# Patient Record
Sex: Male | Born: 1955 | Race: White | Hispanic: No | Marital: Married | Smoking: Never smoker
Health system: Southern US, Community
[De-identification: ages and names within clinical notes are randomized; demographics above are authoritative.]

## PROBLEM LIST (undated history)

## (undated) DIAGNOSIS — E785 Hyperlipidemia, unspecified: Secondary | ICD-10-CM

## (undated) DIAGNOSIS — G4733 Obstructive sleep apnea (adult) (pediatric): Secondary | ICD-10-CM

## (undated) DIAGNOSIS — I10 Essential (primary) hypertension: Secondary | ICD-10-CM

## (undated) HISTORY — DX: Essential (primary) hypertension: I10

## (undated) HISTORY — DX: Hyperlipidemia, unspecified: E78.5

## (undated) HISTORY — DX: Obstructive sleep apnea (adult) (pediatric): G47.33

## (undated) HISTORY — PX: NASAL SEPTUM SURGERY: SHX37

---

## 2003-06-09 ENCOUNTER — Emergency Department (HOSPITAL_COMMUNITY): Admission: EM | Admit: 2003-06-09 | Discharge: 2003-06-10 | Payer: Self-pay | Admitting: Emergency Medicine

## 2003-06-10 ENCOUNTER — Emergency Department (HOSPITAL_COMMUNITY): Admission: EM | Admit: 2003-06-10 | Discharge: 2003-06-10 | Payer: Self-pay | Admitting: Emergency Medicine

## 2004-12-24 ENCOUNTER — Ambulatory Visit: Payer: Self-pay | Admitting: Internal Medicine

## 2005-01-01 ENCOUNTER — Ambulatory Visit: Payer: Self-pay | Admitting: Internal Medicine

## 2005-01-09 ENCOUNTER — Ambulatory Visit: Payer: Self-pay | Admitting: Internal Medicine

## 2005-01-15 ENCOUNTER — Ambulatory Visit: Payer: Self-pay | Admitting: Internal Medicine

## 2005-01-23 ENCOUNTER — Ambulatory Visit: Payer: Self-pay | Admitting: Internal Medicine

## 2005-01-29 ENCOUNTER — Ambulatory Visit: Payer: Self-pay | Admitting: Internal Medicine

## 2005-02-05 ENCOUNTER — Ambulatory Visit: Payer: Self-pay | Admitting: Internal Medicine

## 2005-02-12 ENCOUNTER — Ambulatory Visit: Payer: Self-pay | Admitting: Internal Medicine

## 2005-02-19 ENCOUNTER — Ambulatory Visit: Payer: Self-pay | Admitting: Internal Medicine

## 2005-02-26 ENCOUNTER — Ambulatory Visit: Payer: Self-pay | Admitting: Internal Medicine

## 2005-03-05 ENCOUNTER — Ambulatory Visit: Payer: Self-pay | Admitting: Internal Medicine

## 2005-03-17 ENCOUNTER — Ambulatory Visit: Payer: Self-pay | Admitting: Internal Medicine

## 2005-03-24 ENCOUNTER — Ambulatory Visit: Payer: Self-pay | Admitting: Internal Medicine

## 2005-03-31 ENCOUNTER — Ambulatory Visit: Payer: Self-pay | Admitting: Internal Medicine

## 2005-04-08 ENCOUNTER — Ambulatory Visit: Payer: Self-pay | Admitting: Internal Medicine

## 2005-04-15 ENCOUNTER — Ambulatory Visit: Payer: Self-pay | Admitting: Internal Medicine

## 2005-04-23 ENCOUNTER — Ambulatory Visit: Payer: Self-pay | Admitting: Internal Medicine

## 2005-05-01 ENCOUNTER — Ambulatory Visit: Payer: Self-pay | Admitting: Internal Medicine

## 2005-05-07 ENCOUNTER — Ambulatory Visit: Payer: Self-pay | Admitting: Internal Medicine

## 2005-05-20 ENCOUNTER — Ambulatory Visit: Payer: Self-pay | Admitting: Internal Medicine

## 2005-05-21 ENCOUNTER — Ambulatory Visit: Payer: Self-pay | Admitting: Internal Medicine

## 2005-05-29 ENCOUNTER — Ambulatory Visit: Payer: Self-pay | Admitting: Internal Medicine

## 2005-06-03 ENCOUNTER — Ambulatory Visit: Payer: Self-pay | Admitting: Internal Medicine

## 2005-06-11 ENCOUNTER — Ambulatory Visit: Payer: Self-pay | Admitting: Internal Medicine

## 2005-06-22 ENCOUNTER — Ambulatory Visit: Payer: Self-pay | Admitting: Internal Medicine

## 2005-07-02 ENCOUNTER — Ambulatory Visit: Payer: Self-pay | Admitting: Internal Medicine

## 2005-07-10 ENCOUNTER — Ambulatory Visit: Payer: Self-pay | Admitting: Internal Medicine

## 2005-07-14 ENCOUNTER — Ambulatory Visit: Payer: Self-pay | Admitting: Internal Medicine

## 2005-07-21 ENCOUNTER — Ambulatory Visit: Payer: Self-pay | Admitting: Internal Medicine

## 2005-07-28 ENCOUNTER — Ambulatory Visit: Payer: Self-pay | Admitting: Internal Medicine

## 2005-08-04 ENCOUNTER — Ambulatory Visit: Payer: Self-pay | Admitting: Internal Medicine

## 2005-08-12 ENCOUNTER — Ambulatory Visit: Payer: Self-pay | Admitting: Internal Medicine

## 2005-08-25 ENCOUNTER — Ambulatory Visit: Payer: Self-pay | Admitting: Internal Medicine

## 2005-09-02 ENCOUNTER — Ambulatory Visit: Payer: Self-pay | Admitting: Internal Medicine

## 2005-09-09 ENCOUNTER — Ambulatory Visit: Payer: Self-pay | Admitting: Internal Medicine

## 2005-09-16 ENCOUNTER — Ambulatory Visit: Payer: Self-pay | Admitting: Internal Medicine

## 2005-09-24 ENCOUNTER — Ambulatory Visit: Payer: Self-pay | Admitting: Internal Medicine

## 2005-09-30 ENCOUNTER — Ambulatory Visit: Payer: Self-pay | Admitting: Internal Medicine

## 2005-10-08 ENCOUNTER — Ambulatory Visit: Payer: Self-pay | Admitting: Internal Medicine

## 2005-10-09 ENCOUNTER — Ambulatory Visit: Payer: Self-pay | Admitting: Internal Medicine

## 2005-10-12 ENCOUNTER — Ambulatory Visit: Payer: Self-pay | Admitting: Internal Medicine

## 2005-10-21 ENCOUNTER — Ambulatory Visit: Payer: Self-pay | Admitting: Internal Medicine

## 2005-10-29 ENCOUNTER — Ambulatory Visit: Payer: Self-pay | Admitting: Internal Medicine

## 2005-11-03 ENCOUNTER — Ambulatory Visit: Payer: Self-pay | Admitting: Internal Medicine

## 2005-11-09 ENCOUNTER — Ambulatory Visit: Payer: Self-pay | Admitting: Internal Medicine

## 2005-11-18 ENCOUNTER — Ambulatory Visit: Payer: Self-pay | Admitting: Internal Medicine

## 2005-11-24 ENCOUNTER — Ambulatory Visit: Payer: Self-pay | Admitting: Internal Medicine

## 2005-12-04 ENCOUNTER — Ambulatory Visit: Payer: Self-pay | Admitting: Internal Medicine

## 2005-12-10 ENCOUNTER — Ambulatory Visit: Payer: Self-pay | Admitting: Internal Medicine

## 2005-12-18 ENCOUNTER — Ambulatory Visit: Payer: Self-pay | Admitting: Internal Medicine

## 2005-12-24 ENCOUNTER — Ambulatory Visit: Payer: Self-pay | Admitting: Internal Medicine

## 2005-12-31 ENCOUNTER — Ambulatory Visit: Payer: Self-pay | Admitting: Internal Medicine

## 2006-01-06 ENCOUNTER — Ambulatory Visit: Payer: Self-pay | Admitting: Internal Medicine

## 2006-01-15 ENCOUNTER — Ambulatory Visit: Payer: Self-pay | Admitting: Internal Medicine

## 2006-01-22 ENCOUNTER — Ambulatory Visit: Payer: Self-pay | Admitting: Internal Medicine

## 2006-02-08 ENCOUNTER — Ambulatory Visit: Payer: Self-pay | Admitting: Internal Medicine

## 2006-02-16 ENCOUNTER — Ambulatory Visit: Payer: Self-pay | Admitting: Internal Medicine

## 2006-02-18 ENCOUNTER — Ambulatory Visit: Payer: Self-pay | Admitting: Internal Medicine

## 2006-02-24 ENCOUNTER — Ambulatory Visit: Payer: Self-pay | Admitting: Internal Medicine

## 2006-03-03 ENCOUNTER — Ambulatory Visit: Payer: Self-pay | Admitting: Internal Medicine

## 2006-03-11 ENCOUNTER — Ambulatory Visit: Payer: Self-pay | Admitting: Internal Medicine

## 2006-03-16 ENCOUNTER — Ambulatory Visit: Payer: Self-pay | Admitting: Internal Medicine

## 2006-03-29 ENCOUNTER — Ambulatory Visit: Payer: Self-pay | Admitting: Internal Medicine

## 2006-04-02 ENCOUNTER — Ambulatory Visit: Payer: Self-pay | Admitting: Internal Medicine

## 2006-04-07 ENCOUNTER — Ambulatory Visit: Payer: Self-pay | Admitting: Internal Medicine

## 2006-04-12 ENCOUNTER — Ambulatory Visit: Payer: Self-pay | Admitting: Internal Medicine

## 2006-04-19 ENCOUNTER — Ambulatory Visit: Payer: Self-pay | Admitting: Internal Medicine

## 2006-04-26 ENCOUNTER — Ambulatory Visit: Payer: Self-pay | Admitting: Internal Medicine

## 2006-05-06 ENCOUNTER — Ambulatory Visit: Payer: Self-pay | Admitting: Internal Medicine

## 2006-05-12 ENCOUNTER — Ambulatory Visit: Payer: Self-pay | Admitting: Internal Medicine

## 2006-05-20 ENCOUNTER — Ambulatory Visit: Payer: Self-pay | Admitting: Internal Medicine

## 2006-05-27 ENCOUNTER — Ambulatory Visit: Payer: Self-pay | Admitting: Internal Medicine

## 2006-06-03 ENCOUNTER — Ambulatory Visit: Payer: Self-pay | Admitting: Internal Medicine

## 2006-06-04 ENCOUNTER — Ambulatory Visit: Payer: Self-pay | Admitting: Internal Medicine

## 2006-06-15 ENCOUNTER — Ambulatory Visit: Payer: Self-pay | Admitting: Internal Medicine

## 2006-06-22 ENCOUNTER — Ambulatory Visit: Payer: Self-pay | Admitting: Internal Medicine

## 2006-07-01 ENCOUNTER — Ambulatory Visit: Payer: Self-pay | Admitting: Internal Medicine

## 2006-07-12 ENCOUNTER — Ambulatory Visit: Payer: Self-pay | Admitting: Internal Medicine

## 2006-07-20 ENCOUNTER — Ambulatory Visit: Payer: Self-pay | Admitting: Internal Medicine

## 2006-07-28 ENCOUNTER — Ambulatory Visit: Payer: Self-pay | Admitting: Internal Medicine

## 2006-08-03 ENCOUNTER — Ambulatory Visit: Payer: Self-pay | Admitting: Internal Medicine

## 2006-08-11 ENCOUNTER — Ambulatory Visit: Payer: Self-pay | Admitting: Internal Medicine

## 2006-08-19 ENCOUNTER — Ambulatory Visit: Payer: Self-pay | Admitting: Internal Medicine

## 2006-08-25 ENCOUNTER — Ambulatory Visit: Payer: Self-pay | Admitting: Internal Medicine

## 2006-08-31 ENCOUNTER — Ambulatory Visit: Payer: Self-pay | Admitting: Internal Medicine

## 2006-09-09 ENCOUNTER — Ambulatory Visit: Payer: Self-pay | Admitting: Internal Medicine

## 2006-09-17 ENCOUNTER — Ambulatory Visit: Payer: Self-pay | Admitting: Internal Medicine

## 2006-09-21 ENCOUNTER — Ambulatory Visit: Payer: Self-pay | Admitting: Internal Medicine

## 2006-09-30 ENCOUNTER — Ambulatory Visit: Payer: Self-pay | Admitting: Internal Medicine

## 2006-10-07 ENCOUNTER — Ambulatory Visit: Payer: Self-pay | Admitting: Internal Medicine

## 2006-10-12 ENCOUNTER — Ambulatory Visit: Payer: Self-pay | Admitting: Internal Medicine

## 2006-10-20 ENCOUNTER — Ambulatory Visit: Payer: Self-pay | Admitting: Internal Medicine

## 2006-10-21 ENCOUNTER — Ambulatory Visit: Payer: Self-pay | Admitting: Internal Medicine

## 2006-10-25 ENCOUNTER — Ambulatory Visit: Payer: Self-pay | Admitting: Internal Medicine

## 2006-11-04 ENCOUNTER — Ambulatory Visit: Payer: Self-pay | Admitting: Internal Medicine

## 2006-11-09 ENCOUNTER — Ambulatory Visit: Payer: Self-pay | Admitting: Internal Medicine

## 2006-11-17 ENCOUNTER — Ambulatory Visit: Payer: Self-pay | Admitting: Internal Medicine

## 2006-11-26 ENCOUNTER — Ambulatory Visit: Payer: Self-pay | Admitting: Internal Medicine

## 2006-12-02 ENCOUNTER — Ambulatory Visit: Payer: Self-pay | Admitting: Internal Medicine

## 2006-12-07 ENCOUNTER — Ambulatory Visit: Payer: Self-pay | Admitting: Internal Medicine

## 2006-12-17 ENCOUNTER — Ambulatory Visit: Payer: Self-pay | Admitting: Internal Medicine

## 2006-12-23 ENCOUNTER — Ambulatory Visit: Payer: Self-pay | Admitting: Internal Medicine

## 2006-12-31 ENCOUNTER — Ambulatory Visit: Payer: Self-pay | Admitting: Internal Medicine

## 2007-01-10 ENCOUNTER — Ambulatory Visit: Payer: Self-pay | Admitting: Internal Medicine

## 2007-01-18 ENCOUNTER — Ambulatory Visit: Payer: Self-pay | Admitting: Internal Medicine

## 2007-01-26 ENCOUNTER — Ambulatory Visit: Payer: Self-pay | Admitting: Internal Medicine

## 2007-02-04 ENCOUNTER — Ambulatory Visit: Payer: Self-pay | Admitting: Internal Medicine

## 2007-02-09 ENCOUNTER — Ambulatory Visit: Payer: Self-pay | Admitting: Internal Medicine

## 2007-02-15 ENCOUNTER — Ambulatory Visit: Payer: Self-pay | Admitting: Internal Medicine

## 2007-02-21 ENCOUNTER — Ambulatory Visit: Payer: Self-pay | Admitting: Internal Medicine

## 2007-02-23 ENCOUNTER — Ambulatory Visit: Payer: Self-pay | Admitting: Internal Medicine

## 2007-03-03 ENCOUNTER — Ambulatory Visit: Payer: Self-pay | Admitting: Internal Medicine

## 2007-03-08 ENCOUNTER — Ambulatory Visit: Payer: Self-pay | Admitting: Internal Medicine

## 2007-03-18 ENCOUNTER — Ambulatory Visit: Payer: Self-pay | Admitting: Internal Medicine

## 2007-03-22 ENCOUNTER — Ambulatory Visit: Payer: Self-pay | Admitting: Internal Medicine

## 2007-03-31 ENCOUNTER — Ambulatory Visit: Payer: Self-pay | Admitting: Internal Medicine

## 2007-04-06 ENCOUNTER — Ambulatory Visit: Payer: Self-pay | Admitting: Internal Medicine

## 2007-04-19 ENCOUNTER — Ambulatory Visit: Payer: Self-pay | Admitting: Internal Medicine

## 2007-04-27 ENCOUNTER — Ambulatory Visit: Payer: Self-pay | Admitting: Internal Medicine

## 2007-05-03 ENCOUNTER — Ambulatory Visit: Payer: Self-pay | Admitting: Internal Medicine

## 2007-05-10 ENCOUNTER — Ambulatory Visit: Payer: Self-pay | Admitting: Internal Medicine

## 2007-05-11 ENCOUNTER — Encounter: Admission: RE | Admit: 2007-05-11 | Discharge: 2007-05-11 | Payer: Self-pay | Admitting: Internal Medicine

## 2007-05-12 ENCOUNTER — Encounter: Admission: RE | Admit: 2007-05-12 | Discharge: 2007-05-12 | Payer: Self-pay | Admitting: Gastroenterology

## 2007-05-13 ENCOUNTER — Inpatient Hospital Stay (HOSPITAL_COMMUNITY): Admission: EM | Admit: 2007-05-13 | Discharge: 2007-05-15 | Payer: Self-pay | Admitting: *Deleted

## 2007-05-25 ENCOUNTER — Ambulatory Visit: Payer: Self-pay | Admitting: Internal Medicine

## 2007-06-02 ENCOUNTER — Ambulatory Visit: Payer: Self-pay | Admitting: Internal Medicine

## 2007-06-08 ENCOUNTER — Ambulatory Visit: Payer: Self-pay | Admitting: Internal Medicine

## 2007-06-13 ENCOUNTER — Ambulatory Visit: Payer: Self-pay | Admitting: Internal Medicine

## 2007-06-21 ENCOUNTER — Ambulatory Visit: Payer: Self-pay | Admitting: Internal Medicine

## 2007-06-28 ENCOUNTER — Ambulatory Visit: Payer: Self-pay | Admitting: Internal Medicine

## 2007-07-01 ENCOUNTER — Ambulatory Visit: Payer: Self-pay | Admitting: Internal Medicine

## 2007-07-05 ENCOUNTER — Ambulatory Visit: Payer: Self-pay | Admitting: Internal Medicine

## 2007-07-14 ENCOUNTER — Ambulatory Visit: Payer: Self-pay | Admitting: Internal Medicine

## 2007-07-21 ENCOUNTER — Ambulatory Visit: Payer: Self-pay | Admitting: Internal Medicine

## 2007-07-27 ENCOUNTER — Ambulatory Visit: Payer: Self-pay | Admitting: Internal Medicine

## 2007-08-04 ENCOUNTER — Ambulatory Visit: Payer: Self-pay | Admitting: Internal Medicine

## 2007-08-09 ENCOUNTER — Ambulatory Visit: Payer: Self-pay | Admitting: Internal Medicine

## 2007-08-17 ENCOUNTER — Ambulatory Visit: Payer: Self-pay | Admitting: Internal Medicine

## 2007-08-23 ENCOUNTER — Encounter: Admission: RE | Admit: 2007-08-23 | Discharge: 2007-08-23 | Payer: Self-pay | Admitting: Internal Medicine

## 2007-08-26 ENCOUNTER — Ambulatory Visit: Payer: Self-pay | Admitting: Internal Medicine

## 2007-09-01 ENCOUNTER — Ambulatory Visit: Payer: Self-pay | Admitting: Internal Medicine

## 2007-09-06 ENCOUNTER — Ambulatory Visit: Payer: Self-pay | Admitting: Internal Medicine

## 2007-09-13 ENCOUNTER — Ambulatory Visit: Payer: Self-pay | Admitting: Internal Medicine

## 2007-09-21 ENCOUNTER — Ambulatory Visit: Payer: Self-pay | Admitting: Internal Medicine

## 2007-09-29 ENCOUNTER — Ambulatory Visit: Payer: Self-pay | Admitting: Internal Medicine

## 2007-10-04 ENCOUNTER — Ambulatory Visit: Payer: Self-pay | Admitting: Internal Medicine

## 2007-10-12 DIAGNOSIS — J3089 Other allergic rhinitis: Secondary | ICD-10-CM

## 2007-10-13 ENCOUNTER — Ambulatory Visit: Payer: Self-pay | Admitting: Internal Medicine

## 2007-10-19 ENCOUNTER — Ambulatory Visit: Payer: Self-pay | Admitting: Internal Medicine

## 2007-10-27 ENCOUNTER — Ambulatory Visit: Payer: Self-pay | Admitting: Internal Medicine

## 2007-10-28 ENCOUNTER — Ambulatory Visit: Payer: Self-pay | Admitting: Internal Medicine

## 2007-10-31 ENCOUNTER — Ambulatory Visit: Payer: Self-pay | Admitting: Internal Medicine

## 2007-11-03 ENCOUNTER — Ambulatory Visit: Payer: Self-pay | Admitting: Internal Medicine

## 2007-11-09 ENCOUNTER — Ambulatory Visit: Payer: Self-pay | Admitting: Internal Medicine

## 2007-11-15 ENCOUNTER — Ambulatory Visit: Payer: Self-pay | Admitting: Internal Medicine

## 2007-11-24 ENCOUNTER — Encounter: Admission: RE | Admit: 2007-11-24 | Discharge: 2007-11-24 | Payer: Self-pay | Admitting: Internal Medicine

## 2007-11-24 ENCOUNTER — Ambulatory Visit: Payer: Self-pay | Admitting: Internal Medicine

## 2007-11-30 ENCOUNTER — Ambulatory Visit: Payer: Self-pay | Admitting: Internal Medicine

## 2007-12-08 ENCOUNTER — Ambulatory Visit: Payer: Self-pay | Admitting: Internal Medicine

## 2007-12-13 ENCOUNTER — Ambulatory Visit: Payer: Self-pay | Admitting: Vascular Surgery

## 2007-12-13 ENCOUNTER — Ambulatory Visit: Payer: Self-pay | Admitting: Internal Medicine

## 2007-12-19 ENCOUNTER — Ambulatory Visit: Payer: Self-pay | Admitting: Internal Medicine

## 2007-12-30 ENCOUNTER — Ambulatory Visit: Payer: Self-pay | Admitting: Internal Medicine

## 2008-01-02 ENCOUNTER — Ambulatory Visit: Payer: Self-pay | Admitting: Internal Medicine

## 2008-01-05 ENCOUNTER — Ambulatory Visit: Payer: Self-pay | Admitting: Internal Medicine

## 2008-01-10 ENCOUNTER — Ambulatory Visit: Payer: Self-pay | Admitting: Internal Medicine

## 2008-01-27 ENCOUNTER — Ambulatory Visit: Payer: Self-pay | Admitting: Internal Medicine

## 2008-02-02 ENCOUNTER — Ambulatory Visit: Payer: Self-pay | Admitting: Internal Medicine

## 2008-02-07 ENCOUNTER — Ambulatory Visit: Payer: Self-pay | Admitting: Internal Medicine

## 2008-02-16 ENCOUNTER — Ambulatory Visit: Payer: Self-pay | Admitting: Internal Medicine

## 2008-02-24 ENCOUNTER — Ambulatory Visit: Payer: Self-pay | Admitting: Internal Medicine

## 2008-03-01 ENCOUNTER — Ambulatory Visit: Payer: Self-pay | Admitting: Internal Medicine

## 2008-03-06 ENCOUNTER — Ambulatory Visit: Payer: Self-pay | Admitting: Internal Medicine

## 2008-03-08 ENCOUNTER — Ambulatory Visit: Payer: Self-pay | Admitting: Internal Medicine

## 2008-03-14 ENCOUNTER — Ambulatory Visit: Payer: Self-pay | Admitting: Internal Medicine

## 2008-03-21 ENCOUNTER — Ambulatory Visit: Payer: Self-pay | Admitting: Internal Medicine

## 2008-03-29 ENCOUNTER — Ambulatory Visit: Payer: Self-pay | Admitting: Internal Medicine

## 2008-04-05 ENCOUNTER — Ambulatory Visit: Payer: Self-pay | Admitting: Internal Medicine

## 2008-04-10 ENCOUNTER — Ambulatory Visit: Payer: Self-pay | Admitting: Internal Medicine

## 2008-04-17 ENCOUNTER — Ambulatory Visit: Payer: Self-pay | Admitting: Internal Medicine

## 2008-04-24 ENCOUNTER — Ambulatory Visit: Payer: Self-pay | Admitting: Internal Medicine

## 2008-04-25 ENCOUNTER — Ambulatory Visit: Payer: Self-pay | Admitting: Internal Medicine

## 2008-05-03 ENCOUNTER — Ambulatory Visit: Payer: Self-pay | Admitting: Internal Medicine

## 2008-05-17 ENCOUNTER — Ambulatory Visit: Payer: Self-pay | Admitting: Internal Medicine

## 2008-05-18 ENCOUNTER — Ambulatory Visit: Payer: Self-pay | Admitting: Internal Medicine

## 2008-05-24 ENCOUNTER — Ambulatory Visit: Payer: Self-pay | Admitting: Internal Medicine

## 2008-05-31 ENCOUNTER — Ambulatory Visit: Payer: Self-pay | Admitting: Internal Medicine

## 2008-06-07 ENCOUNTER — Ambulatory Visit: Payer: Self-pay | Admitting: Internal Medicine

## 2008-06-12 ENCOUNTER — Ambulatory Visit: Payer: Self-pay | Admitting: Internal Medicine

## 2008-06-21 ENCOUNTER — Ambulatory Visit: Payer: Self-pay | Admitting: Internal Medicine

## 2008-06-25 ENCOUNTER — Ambulatory Visit: Payer: Self-pay | Admitting: Internal Medicine

## 2008-06-28 ENCOUNTER — Ambulatory Visit: Payer: Self-pay | Admitting: Internal Medicine

## 2008-06-28 DIAGNOSIS — E785 Hyperlipidemia, unspecified: Secondary | ICD-10-CM | POA: Insufficient documentation

## 2008-06-28 DIAGNOSIS — I10 Essential (primary) hypertension: Secondary | ICD-10-CM | POA: Insufficient documentation

## 2008-07-05 ENCOUNTER — Ambulatory Visit: Payer: Self-pay | Admitting: Internal Medicine

## 2008-07-12 ENCOUNTER — Ambulatory Visit: Payer: Self-pay | Admitting: Internal Medicine

## 2008-07-18 ENCOUNTER — Ambulatory Visit: Payer: Self-pay | Admitting: Internal Medicine

## 2008-07-26 ENCOUNTER — Ambulatory Visit: Payer: Self-pay | Admitting: Internal Medicine

## 2008-08-02 ENCOUNTER — Ambulatory Visit: Payer: Self-pay | Admitting: Internal Medicine

## 2008-08-09 ENCOUNTER — Ambulatory Visit: Payer: Self-pay | Admitting: Internal Medicine

## 2008-08-17 ENCOUNTER — Ambulatory Visit: Payer: Self-pay | Admitting: Internal Medicine

## 2008-08-24 ENCOUNTER — Ambulatory Visit: Payer: Self-pay | Admitting: Internal Medicine

## 2008-08-31 ENCOUNTER — Ambulatory Visit: Payer: Self-pay | Admitting: Internal Medicine

## 2008-09-10 ENCOUNTER — Ambulatory Visit: Payer: Self-pay | Admitting: Internal Medicine

## 2008-09-17 ENCOUNTER — Ambulatory Visit: Payer: Self-pay | Admitting: Internal Medicine

## 2008-09-18 ENCOUNTER — Ambulatory Visit: Payer: Self-pay | Admitting: Pulmonary Disease

## 2008-09-24 ENCOUNTER — Ambulatory Visit: Payer: Self-pay | Admitting: Internal Medicine

## 2008-10-01 ENCOUNTER — Ambulatory Visit: Payer: Self-pay | Admitting: Internal Medicine

## 2008-10-08 ENCOUNTER — Ambulatory Visit: Payer: Self-pay | Admitting: Internal Medicine

## 2008-10-15 ENCOUNTER — Ambulatory Visit: Payer: Self-pay | Admitting: Internal Medicine

## 2008-10-22 ENCOUNTER — Ambulatory Visit: Payer: Self-pay | Admitting: Internal Medicine

## 2008-10-29 ENCOUNTER — Ambulatory Visit: Payer: Self-pay | Admitting: Internal Medicine

## 2008-11-05 ENCOUNTER — Ambulatory Visit: Payer: Self-pay | Admitting: Internal Medicine

## 2008-11-12 ENCOUNTER — Ambulatory Visit: Payer: Self-pay | Admitting: Internal Medicine

## 2008-11-21 ENCOUNTER — Ambulatory Visit: Payer: Self-pay | Admitting: Internal Medicine

## 2008-11-26 ENCOUNTER — Ambulatory Visit: Payer: Self-pay | Admitting: Internal Medicine

## 2008-12-03 ENCOUNTER — Ambulatory Visit: Payer: Self-pay | Admitting: Internal Medicine

## 2008-12-10 ENCOUNTER — Ambulatory Visit: Payer: Self-pay | Admitting: Internal Medicine

## 2008-12-17 ENCOUNTER — Telehealth: Payer: Self-pay | Admitting: Internal Medicine

## 2008-12-24 ENCOUNTER — Ambulatory Visit: Payer: Self-pay | Admitting: Internal Medicine

## 2008-12-31 ENCOUNTER — Ambulatory Visit: Payer: Self-pay | Admitting: Internal Medicine

## 2009-01-03 ENCOUNTER — Ambulatory Visit: Payer: Self-pay | Admitting: Internal Medicine

## 2009-01-03 DIAGNOSIS — J018 Other acute sinusitis: Secondary | ICD-10-CM

## 2009-01-07 ENCOUNTER — Ambulatory Visit: Payer: Self-pay | Admitting: Internal Medicine

## 2009-01-14 ENCOUNTER — Ambulatory Visit: Payer: Self-pay | Admitting: Internal Medicine

## 2009-01-21 ENCOUNTER — Ambulatory Visit: Payer: Self-pay | Admitting: Internal Medicine

## 2009-01-28 ENCOUNTER — Ambulatory Visit: Payer: Self-pay | Admitting: Internal Medicine

## 2009-02-04 ENCOUNTER — Ambulatory Visit: Payer: Self-pay | Admitting: Internal Medicine

## 2009-02-13 ENCOUNTER — Ambulatory Visit: Payer: Self-pay | Admitting: Internal Medicine

## 2009-02-18 ENCOUNTER — Ambulatory Visit: Payer: Self-pay | Admitting: Internal Medicine

## 2009-02-26 ENCOUNTER — Ambulatory Visit: Payer: Self-pay | Admitting: Internal Medicine

## 2009-03-04 ENCOUNTER — Ambulatory Visit: Payer: Self-pay | Admitting: Internal Medicine

## 2009-03-05 ENCOUNTER — Ambulatory Visit: Payer: Self-pay | Admitting: Internal Medicine

## 2009-03-11 ENCOUNTER — Ambulatory Visit: Payer: Self-pay | Admitting: Internal Medicine

## 2009-03-18 ENCOUNTER — Ambulatory Visit: Payer: Self-pay | Admitting: Internal Medicine

## 2009-03-25 ENCOUNTER — Ambulatory Visit: Payer: Self-pay | Admitting: Internal Medicine

## 2009-04-01 ENCOUNTER — Ambulatory Visit: Payer: Self-pay | Admitting: Internal Medicine

## 2009-04-08 ENCOUNTER — Ambulatory Visit: Payer: Self-pay | Admitting: Internal Medicine

## 2009-04-15 ENCOUNTER — Ambulatory Visit: Payer: Self-pay | Admitting: Internal Medicine

## 2009-04-29 ENCOUNTER — Ambulatory Visit: Payer: Self-pay | Admitting: Internal Medicine

## 2009-05-06 ENCOUNTER — Ambulatory Visit: Payer: Self-pay | Admitting: Internal Medicine

## 2009-05-14 ENCOUNTER — Ambulatory Visit: Payer: Self-pay | Admitting: Internal Medicine

## 2009-05-21 ENCOUNTER — Ambulatory Visit: Payer: Self-pay | Admitting: Internal Medicine

## 2009-05-23 ENCOUNTER — Ambulatory Visit: Payer: Self-pay | Admitting: Internal Medicine

## 2009-06-03 ENCOUNTER — Ambulatory Visit: Payer: Self-pay | Admitting: Internal Medicine

## 2009-06-10 ENCOUNTER — Ambulatory Visit: Payer: Self-pay | Admitting: Internal Medicine

## 2009-06-17 ENCOUNTER — Ambulatory Visit: Payer: Self-pay | Admitting: Internal Medicine

## 2009-06-25 ENCOUNTER — Ambulatory Visit: Payer: Self-pay | Admitting: Internal Medicine

## 2009-07-01 ENCOUNTER — Ambulatory Visit: Payer: Self-pay | Admitting: Internal Medicine

## 2009-07-02 ENCOUNTER — Ambulatory Visit: Payer: Self-pay | Admitting: Internal Medicine

## 2009-07-09 ENCOUNTER — Ambulatory Visit: Payer: Self-pay | Admitting: Internal Medicine

## 2009-07-18 ENCOUNTER — Ambulatory Visit: Payer: Self-pay | Admitting: Internal Medicine

## 2009-07-22 ENCOUNTER — Ambulatory Visit: Payer: Self-pay | Admitting: Internal Medicine

## 2009-07-30 ENCOUNTER — Ambulatory Visit: Payer: Self-pay | Admitting: Internal Medicine

## 2009-08-05 ENCOUNTER — Ambulatory Visit: Payer: Self-pay | Admitting: Internal Medicine

## 2009-08-13 ENCOUNTER — Ambulatory Visit: Payer: Self-pay | Admitting: Internal Medicine

## 2009-08-19 ENCOUNTER — Ambulatory Visit: Payer: Self-pay | Admitting: Internal Medicine

## 2009-08-27 ENCOUNTER — Ambulatory Visit: Payer: Self-pay | Admitting: Internal Medicine

## 2009-08-30 ENCOUNTER — Ambulatory Visit: Payer: Self-pay | Admitting: Internal Medicine

## 2009-08-30 DIAGNOSIS — G4733 Obstructive sleep apnea (adult) (pediatric): Secondary | ICD-10-CM

## 2009-09-02 ENCOUNTER — Ambulatory Visit: Payer: Self-pay | Admitting: Internal Medicine

## 2009-09-09 ENCOUNTER — Ambulatory Visit: Payer: Self-pay | Admitting: Internal Medicine

## 2009-09-16 ENCOUNTER — Ambulatory Visit: Payer: Self-pay | Admitting: Internal Medicine

## 2009-09-18 ENCOUNTER — Encounter: Payer: Self-pay | Admitting: Internal Medicine

## 2009-09-18 ENCOUNTER — Ambulatory Visit (HOSPITAL_BASED_OUTPATIENT_CLINIC_OR_DEPARTMENT_OTHER): Admission: RE | Admit: 2009-09-18 | Discharge: 2009-09-18 | Payer: Self-pay | Admitting: Internal Medicine

## 2009-09-23 ENCOUNTER — Ambulatory Visit: Payer: Self-pay | Admitting: Internal Medicine

## 2009-09-29 ENCOUNTER — Ambulatory Visit: Payer: Self-pay | Admitting: Internal Medicine

## 2009-10-01 ENCOUNTER — Ambulatory Visit: Payer: Self-pay | Admitting: Internal Medicine

## 2009-10-07 ENCOUNTER — Ambulatory Visit: Payer: Self-pay | Admitting: Internal Medicine

## 2009-10-14 ENCOUNTER — Ambulatory Visit: Payer: Self-pay | Admitting: Internal Medicine

## 2009-10-21 ENCOUNTER — Ambulatory Visit: Payer: Self-pay | Admitting: Internal Medicine

## 2009-10-31 ENCOUNTER — Ambulatory Visit: Payer: Self-pay | Admitting: Internal Medicine

## 2009-11-07 ENCOUNTER — Ambulatory Visit: Payer: Self-pay | Admitting: Internal Medicine

## 2009-11-11 ENCOUNTER — Ambulatory Visit: Payer: Self-pay | Admitting: Internal Medicine

## 2009-11-18 ENCOUNTER — Ambulatory Visit: Payer: Self-pay | Admitting: Internal Medicine

## 2009-11-25 ENCOUNTER — Ambulatory Visit: Payer: Self-pay | Admitting: Internal Medicine

## 2009-12-06 ENCOUNTER — Ambulatory Visit: Payer: Self-pay | Admitting: Internal Medicine

## 2009-12-10 ENCOUNTER — Ambulatory Visit: Payer: Self-pay | Admitting: Internal Medicine

## 2009-12-17 ENCOUNTER — Ambulatory Visit: Payer: Self-pay | Admitting: Internal Medicine

## 2009-12-23 ENCOUNTER — Ambulatory Visit: Payer: Self-pay | Admitting: Internal Medicine

## 2010-01-01 ENCOUNTER — Ambulatory Visit: Payer: Self-pay | Admitting: Internal Medicine

## 2010-01-08 ENCOUNTER — Ambulatory Visit: Payer: Self-pay | Admitting: Internal Medicine

## 2010-01-14 ENCOUNTER — Ambulatory Visit: Payer: Self-pay | Admitting: Internal Medicine

## 2010-01-20 ENCOUNTER — Ambulatory Visit: Payer: Self-pay | Admitting: Internal Medicine

## 2010-01-27 ENCOUNTER — Ambulatory Visit: Payer: Self-pay | Admitting: Internal Medicine

## 2010-02-17 ENCOUNTER — Ambulatory Visit: Payer: Self-pay | Admitting: Internal Medicine

## 2010-02-20 ENCOUNTER — Encounter: Payer: Self-pay | Admitting: Internal Medicine

## 2010-02-24 ENCOUNTER — Ambulatory Visit: Payer: Self-pay | Admitting: Internal Medicine

## 2010-03-02 ENCOUNTER — Encounter: Payer: Self-pay | Admitting: Internal Medicine

## 2010-03-03 ENCOUNTER — Ambulatory Visit: Payer: Self-pay | Admitting: Internal Medicine

## 2010-03-11 ENCOUNTER — Ambulatory Visit: Payer: Self-pay | Admitting: Internal Medicine

## 2010-03-13 ENCOUNTER — Ambulatory Visit: Payer: Self-pay | Admitting: Internal Medicine

## 2010-03-17 ENCOUNTER — Encounter: Payer: Self-pay | Admitting: Internal Medicine

## 2010-03-21 ENCOUNTER — Ambulatory Visit: Payer: Self-pay | Admitting: Internal Medicine

## 2010-03-27 ENCOUNTER — Ambulatory Visit: Payer: Self-pay | Admitting: Internal Medicine

## 2010-03-31 ENCOUNTER — Ambulatory Visit: Payer: Self-pay | Admitting: Internal Medicine

## 2010-04-09 ENCOUNTER — Ambulatory Visit: Payer: Self-pay | Admitting: Internal Medicine

## 2010-04-15 ENCOUNTER — Ambulatory Visit: Payer: Self-pay | Admitting: Internal Medicine

## 2010-04-21 ENCOUNTER — Ambulatory Visit: Payer: Self-pay | Admitting: Internal Medicine

## 2010-04-29 ENCOUNTER — Ambulatory Visit: Payer: Self-pay | Admitting: Internal Medicine

## 2010-05-05 ENCOUNTER — Ambulatory Visit: Payer: Self-pay | Admitting: Internal Medicine

## 2010-05-12 ENCOUNTER — Ambulatory Visit: Payer: Self-pay | Admitting: Internal Medicine

## 2010-05-23 ENCOUNTER — Ambulatory Visit: Payer: Self-pay | Admitting: Internal Medicine

## 2010-05-26 ENCOUNTER — Ambulatory Visit: Payer: Self-pay | Admitting: Internal Medicine

## 2010-06-02 ENCOUNTER — Ambulatory Visit: Payer: Self-pay | Admitting: Internal Medicine

## 2010-06-13 ENCOUNTER — Ambulatory Visit: Payer: Self-pay | Admitting: Internal Medicine

## 2010-06-16 ENCOUNTER — Ambulatory Visit: Payer: Self-pay | Admitting: Internal Medicine

## 2010-06-26 ENCOUNTER — Ambulatory Visit: Payer: Self-pay | Admitting: Internal Medicine

## 2010-07-01 ENCOUNTER — Ambulatory Visit: Payer: Self-pay | Admitting: Internal Medicine

## 2010-07-02 ENCOUNTER — Ambulatory Visit: Payer: Self-pay | Admitting: Internal Medicine

## 2010-07-07 ENCOUNTER — Ambulatory Visit: Payer: Self-pay | Admitting: Internal Medicine

## 2010-07-14 ENCOUNTER — Ambulatory Visit: Payer: Self-pay | Admitting: Internal Medicine

## 2010-07-21 ENCOUNTER — Ambulatory Visit: Payer: Self-pay | Admitting: Internal Medicine

## 2010-08-01 ENCOUNTER — Ambulatory Visit: Payer: Self-pay | Admitting: Internal Medicine

## 2010-08-05 ENCOUNTER — Ambulatory Visit: Payer: Self-pay | Admitting: Internal Medicine

## 2010-08-15 ENCOUNTER — Ambulatory Visit: Payer: Self-pay | Admitting: Internal Medicine

## 2010-08-19 ENCOUNTER — Ambulatory Visit: Payer: Self-pay | Admitting: Internal Medicine

## 2010-08-28 ENCOUNTER — Ambulatory Visit: Payer: Self-pay | Admitting: Internal Medicine

## 2010-09-01 ENCOUNTER — Ambulatory Visit: Payer: Self-pay | Admitting: Internal Medicine

## 2010-09-09 ENCOUNTER — Ambulatory Visit: Payer: Self-pay | Admitting: Internal Medicine

## 2010-09-15 ENCOUNTER — Ambulatory Visit: Payer: Self-pay | Admitting: Internal Medicine

## 2010-09-22 ENCOUNTER — Emergency Department (HOSPITAL_COMMUNITY)
Admission: EM | Admit: 2010-09-22 | Discharge: 2010-09-22 | Payer: Self-pay | Source: Home / Self Care | Admitting: Emergency Medicine

## 2010-10-03 ENCOUNTER — Ambulatory Visit: Payer: Self-pay | Admitting: Internal Medicine

## 2010-10-07 ENCOUNTER — Ambulatory Visit: Payer: Self-pay | Admitting: Internal Medicine

## 2010-10-13 ENCOUNTER — Ambulatory Visit: Payer: Self-pay | Admitting: Internal Medicine

## 2010-10-21 ENCOUNTER — Ambulatory Visit: Payer: Self-pay | Admitting: Internal Medicine

## 2010-10-27 ENCOUNTER — Ambulatory Visit: Payer: Self-pay | Admitting: Internal Medicine

## 2010-10-28 ENCOUNTER — Ambulatory Visit: Payer: Self-pay | Admitting: Internal Medicine

## 2010-10-29 ENCOUNTER — Ambulatory Visit: Payer: Self-pay | Admitting: Internal Medicine

## 2010-11-04 ENCOUNTER — Ambulatory Visit: Payer: Self-pay | Admitting: Internal Medicine

## 2010-11-11 ENCOUNTER — Ambulatory Visit: Payer: Self-pay | Admitting: Internal Medicine

## 2010-11-21 ENCOUNTER — Ambulatory Visit: Payer: Self-pay | Admitting: Internal Medicine

## 2010-11-24 ENCOUNTER — Ambulatory Visit: Payer: Self-pay | Admitting: Internal Medicine

## 2010-12-03 ENCOUNTER — Ambulatory Visit: Payer: Self-pay | Admitting: Internal Medicine

## 2010-12-19 ENCOUNTER — Ambulatory Visit: Payer: Self-pay | Admitting: Internal Medicine

## 2010-12-23 ENCOUNTER — Ambulatory Visit: Payer: Self-pay | Admitting: Internal Medicine

## 2011-01-03 ENCOUNTER — Ambulatory Visit: Payer: Self-pay | Admitting: Internal Medicine

## 2011-01-07 ENCOUNTER — Ambulatory Visit: Payer: Self-pay | Admitting: Internal Medicine

## 2011-01-15 ENCOUNTER — Ambulatory Visit: Payer: Self-pay | Admitting: Internal Medicine

## 2011-01-20 NOTE — Miscellaneous (Signed)
Summary: Injection Record/[facility]  Injection Record/[facility]   Imported By: Sherian Rein 05/13/2010 12:25:24  _____________________________________________________________________  External Attachment:    Type:   Image     Comment:   External Document

## 2011-01-20 NOTE — Assessment & Plan Note (Signed)
Summary: rov 3 months///kp   Primary Provider/Referring Provider:  Jerelyn Scott  CC:  3 month follow up visit-needs new filter and rubber piece for CPAP mask. Ralph Ware  History of Present Illness: 08/30/09- Allergic rhinitis We discussed failure of antihistamines other than Xyzal to give adequate control of sneezing and congestion on prolonged trial. Singulair mayhave helped a little- not cost effective. nasal steroids caused epistaxis. Has tried nasalcrom. Continues allergy vaccine, well tolerated and has helped he drainage and sneezing.  Wife has noted "sometimes alarming" breathholding at night along with his habitual snmoring. he is aware of daytime sleepiness in meetings, with no hx of thyroid trouble.  October 01, 2009- Allergic rhinitis, OSA Returns to f/ u after sleep study done to evaluate concerns reported by his wife last visit. NPSG- Severe OSA and Central apnea 66/hr. Discussed in detail including treatment implications and weight loss.  October 31, 2009-  Allergic rhinitis, OSA Having trouble maintaining sleep with cpap 8 cwp now at 3 weeks. Has had a pressure sore on nose. Filled but didn't take temazepam. If he adjusts mask it makes noise that disturbs his dog. Full face mask. Humidifier ok .Not making nose stuffier.  March 03, 2010- Allergic rhinitis, OSA No rhinitis yet this Spring. He realizes that Lisinopril has caused much of his scratchy throat. He improved when he stopped it for awhile and is willing to continue it. We discussed airway effects. OSA ok using CPAP almost every night. Denies snoring. Sleeping comfortably.    Current Medications (verified): 1)  Xyzal 5 Mg  Tabs (Levocetirizine Dihydrochloride) .... Take 1/2 By Mouth Two Times A Day 2)  Crestor 10 Mg  Tabs (Rosuvastatin Calcium) .Ralph Ware.. 1 By Mouth Daily 3)  Allergy Vaccine(Cat) .... Start New As Separate Vial At 1:50000, 0.59ml and Build On A Weekly Schedule, Separate Injection, To 1:10 As Tolerated. Continue Regular  Vaccine At 1:10 As Before 4)  Allergy Vaccine 1:10 Gh 5)  Aspirin 81 Mg Tbec (Aspirin) .... Take 1 By Mouth Once Daily 6)  Astepro 0.15 % Soln (Azelastine Hcl) .Ralph Ware.. 1-2 Puffs Each Nostril, Up To Twice Daily As Needed 7)  Cpap 8  Advanced 8)  Lisinopril-Hydrochlorothiazide 10-12.5 Mg Tabs (Lisinopril-Hydrochlorothiazide) .... Take 1/2 By Mouth Once Daily 9)  Benadryl 25 Mg Tabs (Diphenhydramine Hcl) .... 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    Vital Signs:  Patient profile:   55 year old male Height:      66 inches Weight:      221.25 pounds BMI:     35.84 O2 Sat:      95 % on Room air Pulse rate:   97 / minute BP sitting:   124 / 84  (left arm) Cuff size:   regular  Vitals Entered By: Reynaldo Minium CMA (March 03, 2010 9:34 AM)  O2 Flow:  Room air  Physical Exam  Additional Exam:  General: A/Ox3; pleasant and cooperative, NAD, SKIN: small pressure sore over bridge of nose NODES: no lymphadenopathy HEENT: [place]/AT, EOM- WNL, Conjuctivae- clear, PERRLA, TM-WNL, Nose- turbinates pale and edematous, no polyps or erosion, Throat- clear and wnl, Melampatti III NECK: Supple w/ fair ROM, JVD- none,  normal carotid impulses w/o bruits Thyroid- normal to palpation, no stridor CHEST: Clear to P&A HEART: RRR, no m/g/r heard ABDOMEN: Soft and nl; WFU:XNAT, nl pulses, no edema  NEURO: Grossly intact to observation      Impression & Recommendations:  Problem # 1:  SLEEP APNEA, OBSTRUCTIVE (ICD-327.23)  We are holding CPAP at 8 until current download report is ready then will adjust as needed. Overall compliance and control are very good.  Problem # 2:  ALLERGIC RHINITIS (ICD-477.9) Assessment: Unchanged  he may need supplemental help this Spring but continues allergy vaccine successfully. Will verify dose now with allergy lab. His updated medication list for this problem includes:    Xyzal 5 Mg Tabs (Levocetirizine dihydrochloride) .Ralph Ware... Take 1/2 by mouth two times a day    Astepro 0.15 % Soln (Azelastine hcl) .Ralph Ware... 1-2 puffs each nostril, up to twice daily as needed    Benadryl 25 Mg Tabs (Diphenhydramine hcl) .Ralph Ware... Take 1 by mouth at bedtime  Medications Added to Medication List This Visit: 1)  Lisinopril-hydrochlorothiazide 10-12.5 Mg Tabs (Lisinopril-hydrochlorothiazide) .... Take 1/2 by mouth once daily 2)  Benadryl 25 Mg Tabs (Diphenhydramine hcl) .... Take 1 by mouth at bedtime  Other Orders: Est. Patient Level III (55732)  Patient Instructions: 1)  Please schedule a follow-up appointment in 6 months. 2)  I will adjust CPAP pressure if appropriate based on the download. Let me know if a change is uncomfortable. 3)  Consider alternative nonsedating otc antihistamines- 4)   claritin/ loratadine or allegra/ fexofenadine.

## 2011-01-20 NOTE — Miscellaneous (Signed)
Summary: Injection Record / [facility]    Injection Record / [facility]    Imported By: Lennie Odor 05/13/2010 15:21:25  _____________________________________________________________________  External Attachment:    Type:   Image     Comment:   External Document

## 2011-01-20 NOTE — Assessment & Plan Note (Signed)
Summary: [name] 6 MONTHS///KP   Primary Provider/Referring Provider:  Jerelyn Scott  CC:  6 month follow up visit-allergies..  History of Present Illness:  October 31, 2009-  Allergic rhinitis, OSA Having trouble maintaining sleep with cpap 8 cwp now at 3 weeks. Has had a pressure sore on nose. Filled but didn't take temazepam. If he adjusts mask it makes noise that disturbs his dog. Full face mask. Humidifier ok .Not making nose stuffier.  March 03, 2010- Allergic rhinitis, OSA No rhinitis yet this Spring. He realizes that Lisinopril has caused much of his scratchy throat. He improved when he stopped it for awhile and is willing to continue it. We discussed airway effects. OSA ok using CPAP almost every night. Denies snoring. Sleeping comfortably.  September 01, 2010- Allergic rhinits, OSA Has done generally well through the summer. A couple of times a week he has an acute spell of nasal congestion, running and sneezing. Usually self limited after 15 minutes or so, without obvious trigger. Usually doesn't try an acute medication. 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 Contiunes allergy vaccine. Was able to visit son who has cats and he tolerated that exposure much better than he had in past.  CPAP- continues every night at 8 cwp. Denies snore. Stil occasional daytime sleepiness.    Preventive Screening-Counseling & Management  Alcohol-Tobacco     Smoking Status: never  Current Medications (verified): 1)  Xyzal 5 Mg  Tabs (Levocetirizine Dihydrochloride) .... Take 1/2 By Mouth Two Times A Day 2)  Crestor 10 Mg  Tabs (Rosuvastatin Calcium) .Marland Kitchen.. 1 By Mouth Daily 3)  Allergy Vaccine(Cat) .... Start New As Separate Vial At 1:50000, 0.57ml and Build On A Weekly Schedule, Separate Injection, To 1:10 As Tolerated. Continue Regular Vaccine At 1:10 As Before 4)  Allergy Vaccine 1:10 Gh 5)  Aspirin 81 Mg Tbec (Aspirin) .... Take 1 By  Mouth Once Daily 6)  Astepro 0.15 % Soln (Azelastine Hcl) .Marland Kitchen.. 1-2 Puffs Each Nostril, Up To Twice Daily As Needed 7)  Cpap 8  Advanced 8)  Lisinopril-Hydrochlorothiazide 10-12.5 Mg Tabs (Lisinopril-Hydrochlorothiazide) .... Take 1/2 By Mouth Once Daily 9)  Benadryl 25 Mg Tabs (Diphenhydramine Hcl) .... 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  The patient denies shortness of breath with activity, shortness of breath at rest, productive cough, non-productive cough, coughing up blood, chest pain, irregular heartbeats, acid heartburn, indigestion, loss of appetite, weight change, abdominal pain, difficulty swallowing, sore throat, tooth/dental problems, and headaches.    Vital Signs:  Patient profile:   56 year old male Height:      66 inches Weight:      222.13 pounds BMI:     35.98 O2 Sat:      95 % on Room air Pulse rate:   72 / minute BP sitting:   122 /  78  (left arm) Cuff size:   regular  Vitals Entered By: Reynaldo Minium CMA (September 01, 2010 9:24 AM)  O2 Flow:  Room air CC: 6 month follow up visit-allergies.   Physical Exam  Additional Exam:  General: A/Ox3; pleasant and cooperative, NAD, SKIN: small pressure sore over bridge of nose NODES: no lymphadenopathy HEENT: [place]/AT, EOM- WNL, Conjuctivae- clear, PERRLA, periorbital puffiness, TM-WNL,  Nose- turbinates pale and edematous, no polyps or erosion, Throat- clear and wnl, Mallampati III NECK: Supple w/ fair ROM, JVD- none, normal carotid impulses w/o bruits Thyroid- normal to palpation, no stridor CHEST: Clear to P&A HEART: RRR, no m/g/r heard ABDOMEN: Soft and nl; BJY:NWGN, nl pulses, no edema  NEURO: Grossly intact to observation      Impression & Recommendations:  Problem # 1:  SLEEP APNEA, OBSTRUCTIVE (ICD-327.23)  Good compliance and control with CPAP.  Problem # 2:  RHINOSINUSITIS, ACUTE (ICD-461.8)  Intermittent episodes sound like reflex/ vasomotor. I will let him try an anticholinergic. Astelin causes watery rhinorhea rebound  that lasts longer than the attacks left untreated.; His updated medication list for this problem includes:    Astepro 0.15 % Soln (Azelastine hcl) .Marland Kitchen... 1-2 puffs each nostril, up to twice daily as needed    Ipratropium Bromide 0.06 % Soln (Ipratropium bromide) .Marland Kitchen... 1-2 puffs each nostril three times a day if needed  Medications Added to Medication List This Visit: 1)  Ipratropium Bromide 0.06 % Soln (Ipratropium bromide) .Marland Kitchen.. 1-2 puffs each nostril three times a day if needed  Other Orders: Est. Patient Level IV (56213)  Patient Instructions: 1)  Please schedule a follow-up appointment in 6 months. 2)  Continue allergy vaccine 3)  Continue CPAP at 8 4)  Try script ipratropium nasal spray Prescriptions: IPRATROPIUM BROMIDE 0.06 % SOLN (IPRATROPIUM BROMIDE) 1-2 puffs each nostril three times a day if needed  #1 x pr n   Entered and Authorized by:   Waymon Budge MD   Signed by:   Waymon Budge MD on 09/01/2010   Method used:   Print then Give to Patient   RxID:   [id number]

## 2011-01-20 NOTE — Medication Information (Signed)
Summary: Xyzal/[facility]  Xyzal/[facility]   Imported By: Sherian Rein 03/24/2010 12:21:31  _____________________________________________________________________  External Attachment:    Type:   Image     Comment:   External Document

## 2011-01-20 NOTE — Letter (Signed)
Summary: CMN for CPAP Supplies/[facility]  CMN for CPAP Supplies/[facility]   Imported By: Sherian Rein 03/05/2010 14:01:41  _____________________________________________________________________  External Attachment:    Type:   Image     Comment:   External Document

## 2011-01-20 NOTE — Miscellaneous (Signed)
Summary: Injection Record/[facility]  Injection Record/[facility]   Imported By: Lanelle Bal 04/23/2010 14:22:08  _____________________________________________________________________  External Attachment:    Type:   Image     Comment:   External Document

## 2011-01-20 NOTE — Assessment & Plan Note (Signed)
Summary: flu shot/mhh  Nurse Visit   Allergies: No Known Drug Allergies  Orders Added: 1)  Flu Vaccine 80yrs + MEDICARE PATIENTS [Q2039] 2)  Administration Flu vaccine - MCR [G0008] Flu Vaccine Consent Questions     Do you have a history of severe allergic reactions to this vaccine? no    Any prior history of allergic reactions to egg and/or gelatin? no    Do you have a sensitivity to the preservative Thimersol? no    Do you have a past history of Guillan-Barre Syndrome? no    Do you currently have an acute febrile illness? no    Have you ever had a severe reaction to latex? no    Vaccine information given and explained to patient? yes    Are you currently pregnant? no    Lot Number:[id number]   Exp Date:06/20/2011   Site Given  Left Deltoid IM Tammy Scott  November 05, 2010 9:07 AM

## 2011-01-20 NOTE — Letter (Signed)
Summary: CMN for CPAP Supplies/[facility]  CMN for CPAP Supplies/[facility]   Imported By: Sherian Rein 02/24/2010 07:32:35  _____________________________________________________________________  External Attachment:    Type:   Image     Comment:   External Document

## 2011-01-20 NOTE — Miscellaneous (Signed)
Summary: Injection Record / [facility]    Injection Record /  Allergy    Imported By: Lennie Odor 10/28/2010 15:03:32  _____________________________________________________________________  External Attachment:    Type:   Image     Comment:   External Document

## 2011-01-20 NOTE — Miscellaneous (Signed)
Summary: Injection Record / [facility]    Injection Record / [facility]    Imported By: Lennie Odor 08/22/2010 11:42:55  _____________________________________________________________________  External Attachment:    Type:   Image     Comment:   External Document

## 2011-01-23 ENCOUNTER — Encounter: Payer: Self-pay | Admitting: Internal Medicine

## 2011-01-23 DIAGNOSIS — J301 Allergic rhinitis due to pollen: Secondary | ICD-10-CM

## 2011-01-29 DIAGNOSIS — J301 Allergic rhinitis due to pollen: Secondary | ICD-10-CM

## 2011-02-11 NOTE — Miscellaneous (Signed)
Summary: Injection [employer]   Imported By: Sherian Rein 02/04/2011 14:36:52  _____________________________________________________________________  External Attachment:    Type:   Image     Comment:   External Document

## 2011-02-13 ENCOUNTER — Ambulatory Visit (INDEPENDENT_AMBULATORY_CARE_PROVIDER_SITE_OTHER): Payer: Managed Care, Other (non HMO)

## 2011-02-13 DIAGNOSIS — J301 Allergic rhinitis due to pollen: Secondary | ICD-10-CM

## 2011-02-17 ENCOUNTER — Ambulatory Visit (INDEPENDENT_AMBULATORY_CARE_PROVIDER_SITE_OTHER): Payer: Managed Care, Other (non HMO)

## 2011-02-17 DIAGNOSIS — J301 Allergic rhinitis due to pollen: Secondary | ICD-10-CM

## 2011-02-24 ENCOUNTER — Ambulatory Visit (INDEPENDENT_AMBULATORY_CARE_PROVIDER_SITE_OTHER): Payer: Managed Care, Other (non HMO)

## 2011-02-24 ENCOUNTER — Encounter: Payer: Self-pay | Admitting: Internal Medicine

## 2011-02-24 DIAGNOSIS — J301 Allergic rhinitis due to pollen: Secondary | ICD-10-CM

## 2011-03-02 ENCOUNTER — Ambulatory Visit (INDEPENDENT_AMBULATORY_CARE_PROVIDER_SITE_OTHER): Payer: Managed Care, Other (non HMO)

## 2011-03-02 ENCOUNTER — Encounter: Payer: Self-pay | Admitting: Internal Medicine

## 2011-03-02 ENCOUNTER — Ambulatory Visit (INDEPENDENT_AMBULATORY_CARE_PROVIDER_SITE_OTHER): Payer: Managed Care, Other (non HMO) | Admitting: Internal Medicine

## 2011-03-02 DIAGNOSIS — G4733 Obstructive sleep apnea (adult) (pediatric): Secondary | ICD-10-CM

## 2011-03-02 DIAGNOSIS — J018 Other acute sinusitis: Secondary | ICD-10-CM

## 2011-03-02 DIAGNOSIS — J301 Allergic rhinitis due to pollen: Secondary | ICD-10-CM

## 2011-03-03 NOTE — Assessment & Plan Note (Signed)
Summary: ALLERGY/CB   Nurse Visit   Allergies: No Known Drug Allergies  Orders Added: 1)  Allergy Injection (1) [95115] 

## 2011-03-05 LAB — POCT RAPID STREP A (OFFICE): Streptococcus, Group A Screen (Direct): NEGATIVE

## 2011-03-09 ENCOUNTER — Encounter: Payer: Self-pay | Admitting: Internal Medicine

## 2011-03-09 ENCOUNTER — Ambulatory Visit (INDEPENDENT_AMBULATORY_CARE_PROVIDER_SITE_OTHER): Payer: Managed Care, Other (non HMO)

## 2011-03-09 DIAGNOSIS — J301 Allergic rhinitis due to pollen: Secondary | ICD-10-CM

## 2011-03-10 NOTE — Assessment & Plan Note (Signed)
Summary: 6 month rov   Primary Provider/Referring Provider:  Jerelyn Scott  CC:  6 month follow up visit-OSA and allergies..  History of Present Illness: March 03, 2010- Allergic rhinitis, OSA No rhinitis yet this Spring. He realizes that Lisinopril has caused much of his scratchy throat. He improved when he stopped it for awhile and is willing to continue it. We discussed airway effects. OSA ok using CPAP almost every night. Denies snoring. Sleeping comfortably.  September 01, 2010- Allergic rhinits, OSA Has done generally well through the summer. A couple of times a week he has an acute spell of nasal congestion, running and sneezing. Usually self limited after 15 minutes or so, without obvious trigger. Usually doesn't try an acute medication. 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 Contiunes allergy vaccine. Was able to visit son who has cats and he tolerated that exposure much better than he had in past.  CPAP- continues every night at 8 cwp. Denies snore. Stil occasional daytime sleepiness.   March 02, 2011- Allergic rhinits, OSA Nurse-CC: 6 month follow up visit-OSA and allergies. Allergy vaccine 1:10 GH. Good control, supplementing w/ 0.5 tab Xyzal once daily and denies symptoms now. This is not yet a peak time for him. Was rx'd in Fall w/ antibiotic for a sinusitis- resolved.  CPAP 8- Doing well, all night every night.     Preventive Screening-Counseling & Management  Alcohol-Tobacco     Smoking Status: never  Current Medications (verified): 1)  Xyzal 5 Mg  Tabs (Levocetirizine Dihydrochloride) .... Take 1/2 By Mouth Two Times A Day 2)  Crestor 10 Mg  Tabs (Rosuvastatin Calcium) .Marland Kitchen.. 1 By Mouth Daily 3)  Allergy Vaccine 1:10 Gh 4)  Aspirin 81 Mg Tbec (Aspirin) .... Take 1 By Mouth Once Daily 5)  Astepro 0.15 % Soln (Azelastine Hcl) .Marland Kitchen.. 1-2 Puffs Each Nostril, Up To Twice Daily As Needed 6)  Cpap 8  Advanced 7)   Losartan Potassium-Hctz 100-12.5 Mg Tabs (Losartan Potassium-Hctz) .... Take 1 By Mouth Once Daily 8)  Benadryl 25 Mg Tabs (Diphenhydramine Hcl) .... Take 1 By Mouth At Bedtime 9)  Ipratropium Bromide 0.06 % Soln (Ipratropium Bromide) .Marland Kitchen.. 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    Vital Signs:  Patient profile:   55 year old male Height:      66 inches Weight:      222.50 pounds BMI:     36.04 O2 Sat:      94 % on Room air Pulse rate:   72 / minute BP sitting:  128 / 78  (left arm) Cuff size:   regular  Vitals Entered By: Reynaldo Minium CMA (March 02, 2011 9:22 AM)  O2 Flow:  Room air CC: 6 month follow up visit-OSA and allergies.   Physical Exam  Additional Exam:  General: A/Ox3; pleasant and cooperative, NAD, overweight SKIN: small pressure sore over bridge of nose NODES: no lymphadenopathy HEENT: [place]/AT, EOM- WNL, Conjuctivae- clear, PERRLA, periorbital  puffiness, TM-WNL, Nose- turbinates pale and edematous, no polyps or erosion, Throat- clear and wnl, Mallampati III NECK: Supple w/ fair ROM, JVD- none, normal carotid impulses w/o bruits Thyroid- normal to palpation, no stridor CHEST: Clear to P&A HEART: RRR, no m/g/r heard ABDOMEN: Soft and nl; ZOX:WRUE, nl pulses, no edema  NEURO: Grossly intact to observation      Impression & Recommendations:  Problem # 1:  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   Problem # 2:  SLEEP APNEA, OBSTRUCTIVE (ICD-327.23)  Good compliance and control on CPAP.   Problem # 3:  RHINOSINUSITIS, ACUTE (ICD-461.8) We are not seeing infection markers now. Discussed distinction between bcterial infection vs allergic and irritant nasal symptoms. His updated medication list for this problem includes:    Astepro 0.15 % Soln (Azelastine hcl) .Marland Kitchen... 1-2 puffs each nostril, up to twice daily as needed    Ipratropium Bromide 0.06 % Soln (Ipratropium bromide) .Marland Kitchen... 1-2 puffs each nostril three times a day if needed  Medications Added to Medication List This Visit: 1)  Losartan Potassium-hctz 100-12.5 Mg Tabs (Losartan potassium-hctz) .... Take 1 by mouth once daily  Other Orders: Est. Patient Level IV (45409)  Patient Instructions: 1)  Please schedule a follow-up appointment in 6 months. 2)  Continue CPAP and allergy vaccine as you are doing. Please call if needed. 3)  Refill Xyzal Prescriptions: XYZAL 5 MG  TABS (LEVOCETIRIZINE DIHYDROCHLORIDE) take 1/2 by mouth two times a day  #90days x 4   Entered and Authorized by:   Waymon Budge MD   Signed by:   Waymon Budge MD on 03/02/2011   Method used:   Print then Give to Patient   RxID:   [id number]

## 2011-03-10 NOTE — Assessment & Plan Note (Signed)
Summary: ALLERGY/CB   Nurse Visit   Allergies: No Known Drug Allergies  Orders Added: 1)  Allergy Injection (1) [95115] 

## 2011-03-16 ENCOUNTER — Ambulatory Visit (INDEPENDENT_AMBULATORY_CARE_PROVIDER_SITE_OTHER): Payer: Managed Care, Other (non HMO)

## 2011-03-16 DIAGNOSIS — J301 Allergic rhinitis due to pollen: Secondary | ICD-10-CM

## 2011-03-19 NOTE — Assessment & Plan Note (Signed)
Summary: allergy/cb  Nurse Visit   Allergies: No Known Drug Allergies  Orders Added: 1)  Allergy Injection (1) [95115] 

## 2011-03-23 ENCOUNTER — Ambulatory Visit (INDEPENDENT_AMBULATORY_CARE_PROVIDER_SITE_OTHER): Payer: Managed Care, Other (non HMO)

## 2011-03-23 DIAGNOSIS — J301 Allergic rhinitis due to pollen: Secondary | ICD-10-CM

## 2011-03-31 ENCOUNTER — Ambulatory Visit (INDEPENDENT_AMBULATORY_CARE_PROVIDER_SITE_OTHER): Payer: Managed Care, Other (non HMO)

## 2011-03-31 DIAGNOSIS — J301 Allergic rhinitis due to pollen: Secondary | ICD-10-CM

## 2011-04-09 ENCOUNTER — Ambulatory Visit (INDEPENDENT_AMBULATORY_CARE_PROVIDER_SITE_OTHER): Payer: Managed Care, Other (non HMO)

## 2011-04-09 DIAGNOSIS — J309 Allergic rhinitis, unspecified: Secondary | ICD-10-CM

## 2011-04-13 ENCOUNTER — Ambulatory Visit (INDEPENDENT_AMBULATORY_CARE_PROVIDER_SITE_OTHER): Payer: Managed Care, Other (non HMO)

## 2011-04-13 DIAGNOSIS — J309 Allergic rhinitis, unspecified: Secondary | ICD-10-CM

## 2011-04-20 ENCOUNTER — Ambulatory Visit (INDEPENDENT_AMBULATORY_CARE_PROVIDER_SITE_OTHER): Payer: Managed Care, Other (non HMO)

## 2011-04-20 DIAGNOSIS — J309 Allergic rhinitis, unspecified: Secondary | ICD-10-CM

## 2011-04-27 ENCOUNTER — Ambulatory Visit (INDEPENDENT_AMBULATORY_CARE_PROVIDER_SITE_OTHER): Payer: Managed Care, Other (non HMO)

## 2011-04-27 DIAGNOSIS — J309 Allergic rhinitis, unspecified: Secondary | ICD-10-CM

## 2011-05-04 ENCOUNTER — Ambulatory Visit (INDEPENDENT_AMBULATORY_CARE_PROVIDER_SITE_OTHER): Payer: Managed Care, Other (non HMO)

## 2011-05-04 DIAGNOSIS — J309 Allergic rhinitis, unspecified: Secondary | ICD-10-CM

## 2011-05-05 NOTE — Consult Note (Signed)
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  He  was referred by Dr. Kevan Ny.   This is a pleasant 55 year old gentleman who experienced the fairly  sudden onset of epigastric pain back in May, 2008.  The pain was sharp  and constant.  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  His pain is controlled  with medications, and he was started on anticoagulation.  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  He  was sent for vascular consultation concerning long-term issues which  might be associated with this.   His pain after the initial episode lasted about a week, and he has had  no further pain since this time.  Specifically, he has had no post  prandial abdominal pain, weight loss, or significant change in his bowel  habits.  He has been eating without difficulty.   His past medical history is significant for hypertension and  hypercholesterolemia.  He denies any history of diabetes, history of  previous myocardial infarction, history of congestive heart failure, or  history of COPD.   FAMILY HISTORY:  There is no history of premature cardiovascular  disease.   SOCIAL HISTORY:  He is married.  He works as a Warden/ranger.  He does not use tobacco.   Review of systems and medications are documented on the medical history  form in his chart.   PHYSICAL EXAMINATION:  This is a pleasant 55 year old gentleman who  appears his stated age.  Blood pressure is 131/71.  Heart rate is 87.  Saturation is 97%.  HEENT:  There is no cervical lymphadenopathy.  I do  not  detect any carotid bruits.  Lungs are clear bilaterally to  auscultation.  On cardiac exam, he has a regular rate and rhythm.  His  abdomen is soft and nontender.  I could not appreciate an abdominal  bruit.  He has normal-pitched bowel sounds.  He has palpable femoral  pulses and warm, well-perfused feet without ischemic ulcers.  He has no  rashes.  He has no significant lower extremity swelling.  Neurologic:  Nonfocal.   I have compared his CT scan from May with the most recent CT scan.  His  dissection over the short segment of the SMA appears to have healed  nicely.  There is no surrounding inflammation, and both channels are  open and smooth.  There is no evidence of compromise of the SMA.   The patient remains asymptomatic and has completed his six month course  of Coumadin.  I would agree with keeping him on aspirin.  I do not think  he will have any long-term sequelae from this dissection, and he appears  to have excellent flow.  I have reassured him that it is extremely  unlikely that he could have another dissection.  I will be happy to see  him back if any new vascular issues  arise.   Di Kindle. Edilia Bo, M.D.  Electronically Signed  CSD/MEDQ  D:  12/13/2007  T:  12/14/2007  Job:  [id number]   cc:   Candyce Churn, M.D.

## 2011-05-05 NOTE — Assessment & Plan Note (Signed)
[facility]                             PULMONARY OFFICE NOTE   NAME:Ralph Ware, Ralph Ware                         MRN:          [id number]  DATE:10/31/2007                            DOB:          May 22, 1956    PULMONARY FOLLOWUP   PROBLEM:  Allergic rhinitis.   HISTORY:  He says he has had a good fall.  One day he needed to take  some Benadryl, but he says he was better the next day.  Occasional  Astelin, not often.  He says today is baseline normal for him.  No chest  problems.  Admits his wife reports that he snores and stops breathing.  I reviewed warning signs for sleep apneas and asked him to discuss these  with his wife because he has a compatible body build.   MEDICATIONS:  1. Allergy vaccine.  2. Xyzal 5 mg.  3. Benicar hydrochlorothiazide.  4. Crestor.  5. Coumadin.  6. Has had flu shot.   OBJECTIVE:  Weight 215 pounds, BP 110/60, pulse 76, room air saturation  96%.  Periorbital edema.  Moderate turbinate edema with mucus bridging.  Sounds mildly stuffy.  Palate 3/4.  Voice quality normal.  No stridor.  Breathing unlabored.  Pulse regular without murmurs.   IMPRESSION:  1. Allergic rhinitis, not under satisfactory control.  2. Suspect sleep apnea, which would be aggravated by his weight and      nasal congestion.  Palate anatomy and short neck.   PLAN:  1. A 19-month refill of Xyzal as requested.  2. Try Sudafed PE p.r.n. with discussion.  3. He will talk with his wife about warning signs for sleep apnea.  4. Return for allergy re-testing.    Clinton D. Maple Hudson, MD, [facility], FACP  Electronically Signed   CDY/MedQ  DD: 10/31/2007  DT: 10/31/2007  Job #: [id number]   cc:   Candyce Churn, M.D.

## 2011-05-05 NOTE — H&P (Signed)
NAMEARYAN, SPARKS NO.:  [id number]   MEDICAL RECORD NO.:  [id number]          PATIENT TYPE:  EMS   LOCATION:  MAJO                         FACILITY:  [facility]   PHYSICIAN:  Theressa Millard, M.D.    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   The patient was in his usual state of health until 10:15 p.m. on May 19.  He developed a crampy mid epigastric pain that worsened over about 5 to  10 minutes and radiated into the back. There was a squeezing sensation  to it. He had a bowel movement but no diarrhea. The pain continued  through the night and was fairly constant. He came in and saw Dr. Donette Larry  on May 20, and ultrasound was done on May 21 which was negative for  gallstones. At that time, he was referred to Dr. Laural Benes who did an  urgent EGD with negative findings on May 22. However, a CT scan was  ordered at that date, and he was found to have an abnormal superior  mesenteric artery with inflammation versus dissection in the area. The  bowel distal to the SMA looked healthy and without problem. The patient  continued to have pain over night which has been relieved fairly  decently with Vicodin. He did have some nausea and vomiting last night  after the CT scan. Stools have been regular but decreasing in frequency.   PAST MEDICAL HISTORY:  MEDICAL:  1. Hypertension.  2. Hypercholesterolemia.  3. Allergic rhinitis.  4. Proctalgia fugax.    SURGICAL:  1. Nasal polyp surgery.  2. Deviated nasal septum surgery.   MEDICATIONS:  1. Benicar HCT 20/12.5 one half tablet daily.  2. Crestor 10 mg 1/2 tablet daily.  3. Xyzal 5 mg 1/2 tablet daily.  4. Astelin spray 2 puffs p.r.n.  5. Aspirin 81 mg daily.  6. Calcium, magnesium, zinc tablet daily.  7.  Vitamin C daily.   SOCIAL HISTORY:  Does not smoke. He drinks about 3 beers a week. He  works for [employer].   FAMILY HISTORY:  Grandmother, father, grandfather have had  hypercholesterolemia. Grandmother had colon cancer. Interestingly  reports that his brother had some vascular problem in the bowel as well.  Details were unknown to the patient. Also somewhat remarkably, the  patient's wife recently had a bout of ischemic colitis.   REVIEW OF SYSTEMS:  No chest pain, shortness of breath, blood in the  stool. All other systems are negative.   PHYSICAL EXAMINATION:  Blood pressure 132/80, pulse 64, weight 204,  temperature 98.2. He is in no distress.  The eyes have pupils which are round and reactive to light. The  extraocular movements are intact. Ear, nose and throat are unremarkable.  NECK:  Is supple. Thyroid is not enlarged or tender.  CHEST:  Clear to auscultation and percussion.  CARDIAC EXAM:  Has  a normal S1 and S2 without a S3, S4, murmur, rub or  click.  ABDOMEN:  Is soft with mild to moderate mid epigastric tenderness which  is almost a point tenderness just lateral to the midline. There is no  rebound.   CT scan report was reviewed. There is evidence of inflammation versus  dissection, the differential diagnosis being dissection versus  vasculitis. This affects the superior mesenteric artery, and no other  lesions are seen in any other arteries, and the bowel appears healthy.   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 Doubt vasculitis as      the patient has no symptoms of systemic illness and no other      evidence of vasculitis in any other distribution. The patient will      be admitted for anticoagulation and converted to Coumadin. The      general plan here will be to have him coumadinized for several      months and then repeat a CT scan or a CT angiogram to look at the      vessel to see what has happened in the  interim. I have reviewed      literature to the best of my ability, and there are reports that      suggest that anticoagulation is appropriate, that stenting is      occasionally possible and occasionally surgery is required. I spoke      with Dr. Edilia Bo of vascular surgery who recommended      anticoagulation at this point. If the patient begins to suffer any      evidence of bowel ischemia, then surgery will be consulted once      again. This is an extraordinarily rare disorder with reportedly      only 20 to 40 cases reported in the literature.  2. Hypertension. Adequate control.  3. Hypercholesterolemia. Continue Crestor.  4. Allergic rhinitis. Continue present medications.      Theressa Millard, M.D.  Electronically Signed     JO/MEDQ  D:  05/13/2007  T:  05/13/2007  Job:  [id number]

## 2011-05-05 NOTE — Discharge Summary (Signed)
Ralph Ware, ZULAUF NO.:  [id number]   MEDICAL RECORD NO.:  [id number]          PATIENT TYPE:  INP   LOCATION:  5008                         FACILITY:  [facility]   PHYSICIAN:  Thora Lance, M.D.  DATE OF BIRTH:  1956-01-13   DATE OF ADMISSION:  05/13/2007  DATE OF DISCHARGE:  05/15/2007                               DISCHARGE SUMMARY   REASON FOR ADMISSION:  Ralph Ware on May 09, 2007 developed crampy  midepigastric pain radiating to the back.  Squeezing in nature.  He was  seen in our office on May 10, 2007, and ultrasound was done on May 11, 2007 and was negative for gallstones.  An EGD was done by Dr. Danise Edge with negative findings on May 12, 2007.  A CAT scan was ordered  at that time and showed abnormal superior mesenteric artery with  inflammation versus dissection in area.  The bowel distal to the SMA  looked healthy without problem.   PHYSICAL EXAMINATION:  VITAL SIGNS:  Blood pressure 132/80, heart 64,  temperature 98.2.  LUNGS:  Clear.  HEART:  Regular rate and rhythm.  ABDOMEN:  Soft with mild-to-moderate midepigastric tenderness.   LABORATORY DATA ON ADMISSION:  CBC:  WBC 8.0, hemoglobin 14.1, platelet  count 162.  Chemistry:  Sodium 139, potassium 3.5, chloride 105,  bicarbonate 26, glucose 99, BUN 8, creatinine 0.8, calcium 8.9, total  protein 6.6, albumin 3.7.  SGOT 22, SGPT 22, total bilirubin 0.9.  INR  1.1.   HOSPITAL COURSE:  The probable diagnosis was superior mesenteric artery  dissection.  Vasculitis was in the differential, but there was no  evidence of vasculitis in any other distribution, and the patient was  not systemically ill.  The patient was admitted for anticoagulation.  The general plan was to have him on Coumadin for several months and have  a repeat CT scan or CT angiogram in the future to look at the vessel to  see what has happened in the interim.  The case was discussed with Dr.  Edilia Bo of vascular surgery  who recommended anticoagulation.  If any  evidence of bowel ischemia developed, surgical consultation was  recommended.  The patient spent almost 48 hours in the hospital.  His  abdominal pain gradually improved and was relatively mild at discharge.  He is still requiring Vicodin one every 4-6 hours to control the pain.  He tolerated a diet without difficulty.  He was discharged in good  condition.  In the hospital he received Lovenox and Coumadin 10 mg x2.  His INR at discharge was 1.7.   DISCHARGE DIAGNOSES:  1. Superior mesenteric artery dissection.  2. Hypertension.  3. Hyperlipidemia.  4. Allergic rhinitis.   PROCEDURES:  None.   DISCHARGE MEDICATIONS:  1. Lovenox 90 mg subcutaneously q.12h. until Coumadin therapeutic.  2. Coumadin 7.5 mg Sunday and Monday night.  Recheck INR Tuesday.  3. Vicodin 5/500 1 or 2 every 4-6 hours p.r.n. for pain, #40 with 2      refills.  4. Benicar HCT 20/12.5 mg, one-half daily.  5. Crestor  10 mg one-half daily.  6. Xyzal 5 mg, one-half daily.  7. Aspirin was stopped.   ACTIVITY:  As tolerated.   DIET:  Low-sodium diet.  Discussed keeping vitamin K intake constant.   DISCHARGE INSTRUCTIONS:  1. Return to work May 18, 2007.  2. Followup INR May 17, 2007 in our office.  3. Dr. Kevan Ny in 1 week.   DISPOSITION:  Discharged to home.           ______________________________  Thora Lance, M.D.     JJG/MEDQ  D:  05/15/2007  T:  05/15/2007  Job:  [id number]   cc:   Candyce Churn, M.D.

## 2011-05-08 NOTE — Assessment & Plan Note (Signed)
[facility]                               PULMONARY OFFICE NOTE   NAME:Ralph Ware, Ralph Ware                         MRN:          [id number]  DATE:10/12/2006                            DOB:          10/02/1956    PULMONARY FOLLOWUP   PROBLEM:  Allergic rhinitis.   HISTORY:  He continues allergy vaccine here at 1:10 with no problems.  This  is a 1-year followup.  He notices that he wakes in the morning with some  nasal congestion and sneezes for a little while.  He blames the fall weather  for increased postnasal drainage despite daily or b.i.d. Allegra, Astelin  Nasal spray, and his allergy vaccine.  He cannot point to any specific  exposure triggers.  Flonase had caused epistaxis in the past.  We discussed  decongestants like Sudafed.  He has both BPH symptoms and hypertension.   MEDICATION:  1. Allergy vaccine.  2. Allegra 60 mg b.i.d.  3. A blood pressure pill he cannot name.  4. Astelin.   No medication allergy.   OBJECTIVE:  Weight 205 pounds.  BP 112/74.  Pulse regular, 76.  Room air  saturation 96%.  There is moderate nasal turbinate edema with clear mucus, periorbital  puffiness.  Long palate obscures the posterior pharyngeal wall with no  obvious erythema.  No stridor or thyromegaly.  No neck vein distention.  His lungs are clear.  Heart sounds are normal.   IMPRESSION:  Seasonal increase in rhinitis.  I am not sure how much of this  is allergic and how much of it reflects some other aspect of the season  change.  If it were a side effect of his blood pressure medication, I would  not except a recent change.   PLAN:  1. Continue vaccine at 1:10 but we are going to be looking at whether he      should be retested.  2. Try replacing Flonase with Nasacort AQ.  3. Try Xyzal 5 mg daily as an antihistamine.  4. Try phenylephrine as a decongestant in low doses, perhaps once every      other day as tolerated.  5. Today he was given a  nebulizer nasal treatment and Depo-Medrol 80 mg      IM.  6. We are scheduling return in 1 year but he understands to get back to [place]      in the next 2 weeks if he fails to clear currently.     Clinton D. Maple Hudson, MD, [facility], FACP    CDY/MedQ  DD: 10/16/2006  DT: 10/18/2006  Job #: [id number]   cc:   Candyce Churn, M.D.

## 2011-05-11 ENCOUNTER — Ambulatory Visit (INDEPENDENT_AMBULATORY_CARE_PROVIDER_SITE_OTHER): Payer: Managed Care, Other (non HMO)

## 2011-05-11 DIAGNOSIS — J309 Allergic rhinitis, unspecified: Secondary | ICD-10-CM

## 2011-05-22 ENCOUNTER — Ambulatory Visit (INDEPENDENT_AMBULATORY_CARE_PROVIDER_SITE_OTHER): Payer: Managed Care, Other (non HMO)

## 2011-05-22 DIAGNOSIS — J309 Allergic rhinitis, unspecified: Secondary | ICD-10-CM

## 2011-05-25 ENCOUNTER — Encounter: Payer: Self-pay | Admitting: Internal Medicine

## 2011-05-25 ENCOUNTER — Ambulatory Visit (INDEPENDENT_AMBULATORY_CARE_PROVIDER_SITE_OTHER): Payer: Managed Care, Other (non HMO)

## 2011-05-25 DIAGNOSIS — J309 Allergic rhinitis, unspecified: Secondary | ICD-10-CM

## 2011-06-01 ENCOUNTER — Ambulatory Visit (INDEPENDENT_AMBULATORY_CARE_PROVIDER_SITE_OTHER): Payer: Managed Care, Other (non HMO)

## 2011-06-01 DIAGNOSIS — J309 Allergic rhinitis, unspecified: Secondary | ICD-10-CM

## 2011-06-09 ENCOUNTER — Ambulatory Visit (INDEPENDENT_AMBULATORY_CARE_PROVIDER_SITE_OTHER): Payer: Managed Care, Other (non HMO)

## 2011-06-09 DIAGNOSIS — J309 Allergic rhinitis, unspecified: Secondary | ICD-10-CM

## 2011-06-10 ENCOUNTER — Ambulatory Visit (INDEPENDENT_AMBULATORY_CARE_PROVIDER_SITE_OTHER): Payer: Managed Care, Other (non HMO)

## 2011-06-10 DIAGNOSIS — J309 Allergic rhinitis, unspecified: Secondary | ICD-10-CM

## 2011-06-15 ENCOUNTER — Ambulatory Visit (INDEPENDENT_AMBULATORY_CARE_PROVIDER_SITE_OTHER): Payer: Managed Care, Other (non HMO)

## 2011-06-15 DIAGNOSIS — J309 Allergic rhinitis, unspecified: Secondary | ICD-10-CM

## 2011-06-22 ENCOUNTER — Ambulatory Visit (INDEPENDENT_AMBULATORY_CARE_PROVIDER_SITE_OTHER): Payer: Managed Care, Other (non HMO)

## 2011-06-22 DIAGNOSIS — J309 Allergic rhinitis, unspecified: Secondary | ICD-10-CM

## 2011-06-29 ENCOUNTER — Ambulatory Visit (INDEPENDENT_AMBULATORY_CARE_PROVIDER_SITE_OTHER): Payer: Managed Care, Other (non HMO)

## 2011-06-29 DIAGNOSIS — J309 Allergic rhinitis, unspecified: Secondary | ICD-10-CM

## 2011-07-06 ENCOUNTER — Ambulatory Visit (INDEPENDENT_AMBULATORY_CARE_PROVIDER_SITE_OTHER): Payer: Managed Care, Other (non HMO)

## 2011-07-06 DIAGNOSIS — J309 Allergic rhinitis, unspecified: Secondary | ICD-10-CM

## 2011-07-13 ENCOUNTER — Ambulatory Visit (INDEPENDENT_AMBULATORY_CARE_PROVIDER_SITE_OTHER): Payer: Managed Care, Other (non HMO)

## 2011-07-13 DIAGNOSIS — J309 Allergic rhinitis, unspecified: Secondary | ICD-10-CM

## 2011-07-27 ENCOUNTER — Ambulatory Visit (INDEPENDENT_AMBULATORY_CARE_PROVIDER_SITE_OTHER): Payer: Managed Care, Other (non HMO)

## 2011-07-27 DIAGNOSIS — J309 Allergic rhinitis, unspecified: Secondary | ICD-10-CM

## 2011-08-03 ENCOUNTER — Ambulatory Visit (INDEPENDENT_AMBULATORY_CARE_PROVIDER_SITE_OTHER): Payer: Managed Care, Other (non HMO)

## 2011-08-03 DIAGNOSIS — J309 Allergic rhinitis, unspecified: Secondary | ICD-10-CM

## 2011-08-13 ENCOUNTER — Ambulatory Visit (INDEPENDENT_AMBULATORY_CARE_PROVIDER_SITE_OTHER): Payer: Managed Care, Other (non HMO)

## 2011-08-13 DIAGNOSIS — J309 Allergic rhinitis, unspecified: Secondary | ICD-10-CM

## 2011-08-17 ENCOUNTER — Ambulatory Visit (INDEPENDENT_AMBULATORY_CARE_PROVIDER_SITE_OTHER): Payer: Managed Care, Other (non HMO)

## 2011-08-17 DIAGNOSIS — J309 Allergic rhinitis, unspecified: Secondary | ICD-10-CM

## 2011-08-21 ENCOUNTER — Encounter: Payer: Self-pay | Admitting: Internal Medicine

## 2011-08-25 ENCOUNTER — Ambulatory Visit (INDEPENDENT_AMBULATORY_CARE_PROVIDER_SITE_OTHER): Payer: Managed Care, Other (non HMO)

## 2011-08-25 DIAGNOSIS — J309 Allergic rhinitis, unspecified: Secondary | ICD-10-CM

## 2011-09-02 ENCOUNTER — Ambulatory Visit (INDEPENDENT_AMBULATORY_CARE_PROVIDER_SITE_OTHER): Payer: Managed Care, Other (non HMO)

## 2011-09-02 ENCOUNTER — Encounter: Payer: Self-pay | Admitting: Internal Medicine

## 2011-09-02 ENCOUNTER — Ambulatory Visit (INDEPENDENT_AMBULATORY_CARE_PROVIDER_SITE_OTHER): Payer: Managed Care, Other (non HMO) | Admitting: Internal Medicine

## 2011-09-02 VITALS — BP 114/72 | HR 73 | Ht 66.0 in | Wt 227.6 lb

## 2011-09-02 DIAGNOSIS — Z23 Encounter for immunization: Secondary | ICD-10-CM

## 2011-09-02 DIAGNOSIS — J301 Allergic rhinitis due to pollen: Secondary | ICD-10-CM

## 2011-09-02 DIAGNOSIS — G4733 Obstructive sleep apnea (adult) (pediatric): Secondary | ICD-10-CM

## 2011-09-02 DIAGNOSIS — J309 Allergic rhinitis, unspecified: Secondary | ICD-10-CM

## 2011-09-02 NOTE — Progress Notes (Signed)
Subjective:    Patient ID: Ralph Ware, male    DOB: 04-Feb-1956, 55 y.o.   MRN: [id number]  HPI 09/02/11- 15 yoM never smoker followed for OSA, allergic rhinitis, complicated by HBP. Last here March 02, 2011 Noting a little early fall nasal congestion. Had been taking 1/2 tab Xyzal and will go up to a full tab if needed. Rarely uses Astepro.  Allergy vaccine is doing fine. Using Azasite (azithromycin) eyedrop for antibacterial and antiinflammatory effects per his opthalmologist.  CPAP good- all night every night  Review of Systems Constitutional:   No-   weight loss, night sweats, fevers, chills, fatigue, lassitude. HEENT:   No-  headaches, difficulty swallowing, tooth/dental problems, sore throat,       No-  sneezing, itching, ear ache,    + nasal congestion, post nasal drip,  CV:  No-   chest pain, orthopnea, PND, swelling in lower extremities, anasarca,  dizziness, palpitations Resp: No-   shortness of breath with exertion or at rest.              No-   productive cough,  No non-productive cough,  No-  coughing up of blood.              No-   change in color of mucus.  No- wheezing.   Skin: No-   rash or lesions. GI:  No-   heartburn, indigestion, abdominal pain, nausea, vomiting, diarrhea,                 change in bowel habits, loss of appetite GU: No-   dysuria, change in color of urine, no urgency or frequency.  No- flank pain. MS:  No-   joint pain or swelling.  No- decreased range of motion.  No- back pain. Neuro- grossly normal to observation, Or:  Psych:  No- change in mood or affect. No depression or anxiety.  No memory loss.      Objective:   Physical Exam General- Alert, Oriented, Affect-appropriate, Distress- none acute  Stocky build Skin- rash-none, lesions- none, excoriation- none Lymphadenopathy- none Head- atraumatic            Eyes- Gross vision intact, PERRLA, conjunctivae clear secretions. Periorbital edema            Ears- Hearing, canals            Nose-  Clear with turbinate edema, No-Septal dev, mucus is watery ,No- polyps, erosion, perforation             Throat- Mallampati III-IV , mucosa clear , drainage- none, tonsils- atrophic Neck- flexible , trachea midline, no stridor , thyroid nl, carotid no bruit Chest - symmetrical excursion , unlabored           Heart/CV- RRR , no murmur , no gallop  , no rub, nl s1 s2                           - JVD- none , edema- none, stasis changes- none, varices- none           Lung- clear to P&A, wheeze- none, cough- none , dullness-none, rub- none           Chest wall-  Abd- tender-no, distended-no, bowel sounds-present, HSM- no Br/ Gen/ Rectal- Not done, not indicated Extrem- cyanosis- none, clubbing, none, atrophy- none, strength- nl Neuro- grossly intact to observation         Assessment & Plan:

## 2011-09-02 NOTE — Patient Instructions (Signed)
Flu vax  Please call as needed  

## 2011-09-02 NOTE — Assessment & Plan Note (Signed)
Good compliance and control 

## 2011-09-02 NOTE — Assessment & Plan Note (Addendum)
He has been sure allergy vaccine helps him.  Minor seasonal flare- under control with current meds.   Flu vax discussed

## 2011-09-07 ENCOUNTER — Ambulatory Visit (INDEPENDENT_AMBULATORY_CARE_PROVIDER_SITE_OTHER): Payer: Managed Care, Other (non HMO)

## 2011-09-07 DIAGNOSIS — J309 Allergic rhinitis, unspecified: Secondary | ICD-10-CM

## 2011-09-21 ENCOUNTER — Ambulatory Visit (INDEPENDENT_AMBULATORY_CARE_PROVIDER_SITE_OTHER): Payer: Managed Care, Other (non HMO)

## 2011-09-21 DIAGNOSIS — J309 Allergic rhinitis, unspecified: Secondary | ICD-10-CM

## 2011-09-23 ENCOUNTER — Ambulatory Visit (INDEPENDENT_AMBULATORY_CARE_PROVIDER_SITE_OTHER): Payer: Managed Care, Other (non HMO)

## 2011-09-23 DIAGNOSIS — J309 Allergic rhinitis, unspecified: Secondary | ICD-10-CM

## 2011-09-29 ENCOUNTER — Ambulatory Visit (INDEPENDENT_AMBULATORY_CARE_PROVIDER_SITE_OTHER): Payer: Managed Care, Other (non HMO)

## 2011-09-29 DIAGNOSIS — J309 Allergic rhinitis, unspecified: Secondary | ICD-10-CM

## 2011-10-06 ENCOUNTER — Ambulatory Visit (INDEPENDENT_AMBULATORY_CARE_PROVIDER_SITE_OTHER): Payer: Managed Care, Other (non HMO)

## 2011-10-06 DIAGNOSIS — J309 Allergic rhinitis, unspecified: Secondary | ICD-10-CM

## 2011-10-13 ENCOUNTER — Encounter: Payer: Self-pay | Admitting: Internal Medicine

## 2011-10-13 ENCOUNTER — Ambulatory Visit (INDEPENDENT_AMBULATORY_CARE_PROVIDER_SITE_OTHER): Payer: Managed Care, Other (non HMO)

## 2011-10-13 ENCOUNTER — Ambulatory Visit (INDEPENDENT_AMBULATORY_CARE_PROVIDER_SITE_OTHER): Payer: Managed Care, Other (non HMO) | Admitting: Internal Medicine

## 2011-10-13 VITALS — BP 112/80 | HR 85 | Ht 66.0 in | Wt 221.6 lb

## 2011-10-13 DIAGNOSIS — J018 Other acute sinusitis: Secondary | ICD-10-CM

## 2011-10-13 DIAGNOSIS — J309 Allergic rhinitis, unspecified: Secondary | ICD-10-CM

## 2011-10-13 DIAGNOSIS — R490 Dysphonia: Secondary | ICD-10-CM

## 2011-10-13 MED ORDER — AMOXICILLIN-POT CLAVULANATE 875-125 MG PO TABS: 1.0000 | ORAL_TABLET | Freq: Two times a day (BID) | ORAL | Status: AC

## 2011-10-13 NOTE — Progress Notes (Signed)
Patient ID: Ralph Ware, male    DOB: 29-Apr-1956, 55 y.o.   MRN: [id number]  HPI 09/02/11- 53 yoM never smoker followed for OSA, allergic rhinitis, complicated by HBP. Last here March 02, 2011 Noting a little early fall nasal congestion. Had been taking 1/2 tab Xyzal and will go up to a full tab if needed. Rarely uses Astepro.  Allergy vaccine is doing fine. Using Azasite (azithromycin) eyedrop for antibacterial and antiinflammatory effects per his opthalmologist.  CPAP good- all night every night  10/13/11- 55 yoM never smoker followed for OSA, allergic rhinitis, complicated by HBP. Acute visit-shortly after last visit a month ago, laryngitis began as an allergy/cold with increasing thick postnasal drainage turning yellow. Wife had died 2 weeks ago (mixed connective tissue disease, MI) and he has been under a great deal of stress with much need to use his voice to speak. Difficult to get voice rest. Progressive hoarseness. Neti pot. Nose and throat are getting clearer and voices beginning to improve. He has had long-standing concern about his voice quality anyway and would like an ENT evaluation because of chronic raspy voice. He denies aspiration or choking with meals. He discontinued sleeping every night with CPAP, using a humidifier.  Review of Systems Constitutional:   No-   weight loss, night sweats, fevers, chills, fatigue, lassitude. HEENT:   No-  headaches, difficulty swallowing, tooth/dental problems, sore throat,       No-  sneezing, itching, ear ache,    + nasal congestion, post nasal drip,  CV:  No-   chest pain, orthopnea, PND, swelling in lower extremities, anasarca,  dizziness, palpitations Resp: No-   shortness of breath with exertion or at rest.              No-   productive cough,  No non-productive cough,  No-  coughing up of blood.              No-   change in color of mucus.  No- wheezing.   Skin: No-   rash or lesions. GI:  No-   heartburn, indigestion, abdominal pain,  nausea, vomiting, diarrhea,                 change in bowel habits, loss of appetite GU: No-   dysuria, change in color of urine, no urgency or frequency.  No- flank pain. MS:  No-   joint pain or swelling.  No- decreased range of motion.  No- back pain. Neuro- grossly normal to observation, Or:  Psych:  No- change in mood or affect. +depression or anxiety.  No memory loss.      Objective:   Physical Exam General- Alert, Oriented, Affect-appropriate, Distress- none acute  Stocky build Skin- rash-none, lesions- none, excoriation- none Lymphadenopathy- none Head- atraumatic            Eyes- Gross vision intact, PERRLA, conjunctivae clear secretions. Periorbital edema            Ears- Hearing, canals- normal            Nose- Clear with turbinate edema, No-Septal dev, mucus is watery ,No- polyps, erosion, perforation             Throat- Mallampati III-IV , mucosa red , drainage- none, tonsils- atrophic. Hoarse. Neck- flexible , trachea midline, no stridor , thyroid nl, carotid no bruit Chest - symmetrical excursion , unlabored           Heart/CV- RRR , no murmur , no  gallop  , no rub, nl s1 s2                           - JVD- none , edema- none, stasis changes- none, varices- none           Lung- clear to P&A, wheeze- none, cough- none , dullness-none, rub- none           Chest wall-  Abd- tender-no, distended-no, bowel sounds-present, HSM- no Br/ Gen/ Rectal- Not done, not indicated Extrem- cyanosis- none, clubbing, none, atrophy- none, strength- nl Neuro- grossly intact to observation

## 2011-10-13 NOTE — Patient Instructions (Signed)
Script sent for Augmentin  Continue Neti pot  [facility]

## 2011-10-16 NOTE — Assessment & Plan Note (Signed)
Pharyngitis/laryngitis x 2 weeks. Possible sinusitis. He indicates there is a background underlying hoarseness which he would like evaluated after the acute illness resolves.

## 2011-10-20 ENCOUNTER — Ambulatory Visit (INDEPENDENT_AMBULATORY_CARE_PROVIDER_SITE_OTHER): Payer: Managed Care, Other (non HMO)

## 2011-10-20 DIAGNOSIS — J309 Allergic rhinitis, unspecified: Secondary | ICD-10-CM

## 2011-10-26 ENCOUNTER — Ambulatory Visit (INDEPENDENT_AMBULATORY_CARE_PROVIDER_SITE_OTHER): Payer: Managed Care, Other (non HMO)

## 2011-10-26 DIAGNOSIS — J309 Allergic rhinitis, unspecified: Secondary | ICD-10-CM

## 2011-11-03 ENCOUNTER — Ambulatory Visit (INDEPENDENT_AMBULATORY_CARE_PROVIDER_SITE_OTHER): Payer: Managed Care, Other (non HMO)

## 2011-11-03 DIAGNOSIS — J309 Allergic rhinitis, unspecified: Secondary | ICD-10-CM

## 2011-11-09 ENCOUNTER — Ambulatory Visit (INDEPENDENT_AMBULATORY_CARE_PROVIDER_SITE_OTHER): Payer: Managed Care, Other (non HMO)

## 2011-11-09 DIAGNOSIS — J309 Allergic rhinitis, unspecified: Secondary | ICD-10-CM

## 2011-11-17 ENCOUNTER — Ambulatory Visit (INDEPENDENT_AMBULATORY_CARE_PROVIDER_SITE_OTHER): Payer: Managed Care, Other (non HMO)

## 2011-11-17 DIAGNOSIS — J309 Allergic rhinitis, unspecified: Secondary | ICD-10-CM

## 2011-11-23 ENCOUNTER — Encounter: Payer: Self-pay | Admitting: Internal Medicine

## 2011-11-23 ENCOUNTER — Ambulatory Visit (INDEPENDENT_AMBULATORY_CARE_PROVIDER_SITE_OTHER): Payer: Managed Care, Other (non HMO)

## 2011-11-23 DIAGNOSIS — J309 Allergic rhinitis, unspecified: Secondary | ICD-10-CM

## 2011-11-30 ENCOUNTER — Ambulatory Visit (INDEPENDENT_AMBULATORY_CARE_PROVIDER_SITE_OTHER): Payer: Managed Care, Other (non HMO)

## 2011-11-30 DIAGNOSIS — J309 Allergic rhinitis, unspecified: Secondary | ICD-10-CM

## 2011-12-11 ENCOUNTER — Ambulatory Visit (INDEPENDENT_AMBULATORY_CARE_PROVIDER_SITE_OTHER): Payer: Managed Care, Other (non HMO)

## 2011-12-11 DIAGNOSIS — J309 Allergic rhinitis, unspecified: Secondary | ICD-10-CM

## 2011-12-17 ENCOUNTER — Ambulatory Visit (INDEPENDENT_AMBULATORY_CARE_PROVIDER_SITE_OTHER): Payer: Managed Care, Other (non HMO)

## 2011-12-17 DIAGNOSIS — J309 Allergic rhinitis, unspecified: Secondary | ICD-10-CM

## 2011-12-21 ENCOUNTER — Ambulatory Visit (INDEPENDENT_AMBULATORY_CARE_PROVIDER_SITE_OTHER): Payer: Managed Care, Other (non HMO)

## 2011-12-21 DIAGNOSIS — J309 Allergic rhinitis, unspecified: Secondary | ICD-10-CM

## 2011-12-28 ENCOUNTER — Ambulatory Visit (INDEPENDENT_AMBULATORY_CARE_PROVIDER_SITE_OTHER): Payer: Managed Care, Other (non HMO)

## 2011-12-28 DIAGNOSIS — J309 Allergic rhinitis, unspecified: Secondary | ICD-10-CM

## 2012-01-04 ENCOUNTER — Ambulatory Visit (INDEPENDENT_AMBULATORY_CARE_PROVIDER_SITE_OTHER): Payer: Managed Care, Other (non HMO)

## 2012-01-04 DIAGNOSIS — J309 Allergic rhinitis, unspecified: Secondary | ICD-10-CM

## 2012-01-06 ENCOUNTER — Ambulatory Visit (INDEPENDENT_AMBULATORY_CARE_PROVIDER_SITE_OTHER): Payer: Managed Care, Other (non HMO)

## 2012-01-06 DIAGNOSIS — J309 Allergic rhinitis, unspecified: Secondary | ICD-10-CM

## 2012-01-11 ENCOUNTER — Ambulatory Visit (INDEPENDENT_AMBULATORY_CARE_PROVIDER_SITE_OTHER): Payer: Managed Care, Other (non HMO)

## 2012-01-11 DIAGNOSIS — J309 Allergic rhinitis, unspecified: Secondary | ICD-10-CM

## 2012-01-22 ENCOUNTER — Ambulatory Visit (INDEPENDENT_AMBULATORY_CARE_PROVIDER_SITE_OTHER): Payer: Managed Care, Other (non HMO)

## 2012-01-22 DIAGNOSIS — J309 Allergic rhinitis, unspecified: Secondary | ICD-10-CM

## 2012-01-25 ENCOUNTER — Ambulatory Visit (INDEPENDENT_AMBULATORY_CARE_PROVIDER_SITE_OTHER): Payer: Managed Care, Other (non HMO)

## 2012-01-25 DIAGNOSIS — J309 Allergic rhinitis, unspecified: Secondary | ICD-10-CM

## 2012-02-03 ENCOUNTER — Ambulatory Visit (INDEPENDENT_AMBULATORY_CARE_PROVIDER_SITE_OTHER): Payer: Managed Care, Other (non HMO)

## 2012-02-03 DIAGNOSIS — J309 Allergic rhinitis, unspecified: Secondary | ICD-10-CM

## 2012-02-09 ENCOUNTER — Ambulatory Visit (INDEPENDENT_AMBULATORY_CARE_PROVIDER_SITE_OTHER): Payer: Managed Care, Other (non HMO)

## 2012-02-09 DIAGNOSIS — J309 Allergic rhinitis, unspecified: Secondary | ICD-10-CM

## 2012-02-19 ENCOUNTER — Ambulatory Visit (INDEPENDENT_AMBULATORY_CARE_PROVIDER_SITE_OTHER): Payer: Managed Care, Other (non HMO)

## 2012-02-19 DIAGNOSIS — J309 Allergic rhinitis, unspecified: Secondary | ICD-10-CM

## 2012-02-22 ENCOUNTER — Ambulatory Visit (INDEPENDENT_AMBULATORY_CARE_PROVIDER_SITE_OTHER): Payer: Managed Care, Other (non HMO)

## 2012-02-22 DIAGNOSIS — J309 Allergic rhinitis, unspecified: Secondary | ICD-10-CM

## 2012-02-25 ENCOUNTER — Encounter: Payer: Self-pay | Admitting: Internal Medicine

## 2012-02-29 ENCOUNTER — Ambulatory Visit (INDEPENDENT_AMBULATORY_CARE_PROVIDER_SITE_OTHER): Payer: Managed Care, Other (non HMO)

## 2012-02-29 DIAGNOSIS — J309 Allergic rhinitis, unspecified: Secondary | ICD-10-CM

## 2012-03-10 ENCOUNTER — Ambulatory Visit (INDEPENDENT_AMBULATORY_CARE_PROVIDER_SITE_OTHER): Payer: Managed Care, Other (non HMO)

## 2012-03-10 DIAGNOSIS — J309 Allergic rhinitis, unspecified: Secondary | ICD-10-CM

## 2012-03-14 ENCOUNTER — Ambulatory Visit (INDEPENDENT_AMBULATORY_CARE_PROVIDER_SITE_OTHER): Payer: Managed Care, Other (non HMO)

## 2012-03-14 DIAGNOSIS — J309 Allergic rhinitis, unspecified: Secondary | ICD-10-CM

## 2012-03-22 ENCOUNTER — Ambulatory Visit (INDEPENDENT_AMBULATORY_CARE_PROVIDER_SITE_OTHER): Payer: Managed Care, Other (non HMO)

## 2012-03-22 DIAGNOSIS — J309 Allergic rhinitis, unspecified: Secondary | ICD-10-CM

## 2012-04-01 ENCOUNTER — Ambulatory Visit (INDEPENDENT_AMBULATORY_CARE_PROVIDER_SITE_OTHER): Payer: Managed Care, Other (non HMO)

## 2012-04-01 DIAGNOSIS — J309 Allergic rhinitis, unspecified: Secondary | ICD-10-CM

## 2012-04-04 ENCOUNTER — Ambulatory Visit (INDEPENDENT_AMBULATORY_CARE_PROVIDER_SITE_OTHER): Payer: Managed Care, Other (non HMO)

## 2012-04-04 DIAGNOSIS — J309 Allergic rhinitis, unspecified: Secondary | ICD-10-CM

## 2012-04-11 ENCOUNTER — Ambulatory Visit (INDEPENDENT_AMBULATORY_CARE_PROVIDER_SITE_OTHER): Payer: Managed Care, Other (non HMO)

## 2012-04-11 DIAGNOSIS — J309 Allergic rhinitis, unspecified: Secondary | ICD-10-CM

## 2012-04-18 ENCOUNTER — Ambulatory Visit (INDEPENDENT_AMBULATORY_CARE_PROVIDER_SITE_OTHER): Payer: Managed Care, Other (non HMO)

## 2012-04-18 DIAGNOSIS — J309 Allergic rhinitis, unspecified: Secondary | ICD-10-CM

## 2012-04-19 ENCOUNTER — Ambulatory Visit (INDEPENDENT_AMBULATORY_CARE_PROVIDER_SITE_OTHER): Payer: Managed Care, Other (non HMO)

## 2012-04-19 DIAGNOSIS — J309 Allergic rhinitis, unspecified: Secondary | ICD-10-CM

## 2012-04-26 ENCOUNTER — Ambulatory Visit (INDEPENDENT_AMBULATORY_CARE_PROVIDER_SITE_OTHER): Payer: Managed Care, Other (non HMO)

## 2012-04-26 DIAGNOSIS — J309 Allergic rhinitis, unspecified: Secondary | ICD-10-CM

## 2012-05-02 ENCOUNTER — Ambulatory Visit (INDEPENDENT_AMBULATORY_CARE_PROVIDER_SITE_OTHER): Payer: Managed Care, Other (non HMO)

## 2012-05-02 DIAGNOSIS — J309 Allergic rhinitis, unspecified: Secondary | ICD-10-CM

## 2012-05-11 ENCOUNTER — Ambulatory Visit (INDEPENDENT_AMBULATORY_CARE_PROVIDER_SITE_OTHER): Payer: Managed Care, Other (non HMO)

## 2012-05-11 DIAGNOSIS — J309 Allergic rhinitis, unspecified: Secondary | ICD-10-CM

## 2012-05-20 ENCOUNTER — Ambulatory Visit (INDEPENDENT_AMBULATORY_CARE_PROVIDER_SITE_OTHER): Payer: Managed Care, Other (non HMO)

## 2012-05-20 DIAGNOSIS — J309 Allergic rhinitis, unspecified: Secondary | ICD-10-CM

## 2012-05-27 ENCOUNTER — Ambulatory Visit (INDEPENDENT_AMBULATORY_CARE_PROVIDER_SITE_OTHER): Payer: Managed Care, Other (non HMO)

## 2012-05-27 DIAGNOSIS — J309 Allergic rhinitis, unspecified: Secondary | ICD-10-CM

## 2012-05-31 ENCOUNTER — Encounter: Payer: Self-pay | Admitting: Internal Medicine

## 2012-06-03 ENCOUNTER — Ambulatory Visit (INDEPENDENT_AMBULATORY_CARE_PROVIDER_SITE_OTHER): Payer: Managed Care, Other (non HMO)

## 2012-06-03 DIAGNOSIS — J309 Allergic rhinitis, unspecified: Secondary | ICD-10-CM

## 2012-06-07 ENCOUNTER — Ambulatory Visit (INDEPENDENT_AMBULATORY_CARE_PROVIDER_SITE_OTHER): Payer: Managed Care, Other (non HMO)

## 2012-06-07 DIAGNOSIS — J309 Allergic rhinitis, unspecified: Secondary | ICD-10-CM

## 2012-06-14 ENCOUNTER — Ambulatory Visit (INDEPENDENT_AMBULATORY_CARE_PROVIDER_SITE_OTHER): Payer: Managed Care, Other (non HMO)

## 2012-06-14 DIAGNOSIS — J309 Allergic rhinitis, unspecified: Secondary | ICD-10-CM

## 2012-06-21 ENCOUNTER — Ambulatory Visit (INDEPENDENT_AMBULATORY_CARE_PROVIDER_SITE_OTHER): Payer: Managed Care, Other (non HMO)

## 2012-06-21 DIAGNOSIS — J309 Allergic rhinitis, unspecified: Secondary | ICD-10-CM

## 2012-06-27 ENCOUNTER — Ambulatory Visit (INDEPENDENT_AMBULATORY_CARE_PROVIDER_SITE_OTHER): Payer: Managed Care, Other (non HMO)

## 2012-06-27 DIAGNOSIS — J309 Allergic rhinitis, unspecified: Secondary | ICD-10-CM

## 2012-07-11 ENCOUNTER — Ambulatory Visit (INDEPENDENT_AMBULATORY_CARE_PROVIDER_SITE_OTHER): Payer: Managed Care, Other (non HMO)

## 2012-07-11 DIAGNOSIS — J309 Allergic rhinitis, unspecified: Secondary | ICD-10-CM

## 2012-07-22 ENCOUNTER — Ambulatory Visit (INDEPENDENT_AMBULATORY_CARE_PROVIDER_SITE_OTHER): Payer: Managed Care, Other (non HMO)

## 2012-07-22 DIAGNOSIS — J309 Allergic rhinitis, unspecified: Secondary | ICD-10-CM

## 2012-07-25 ENCOUNTER — Ambulatory Visit (INDEPENDENT_AMBULATORY_CARE_PROVIDER_SITE_OTHER): Payer: Managed Care, Other (non HMO)

## 2012-07-25 DIAGNOSIS — J309 Allergic rhinitis, unspecified: Secondary | ICD-10-CM

## 2012-08-01 ENCOUNTER — Ambulatory Visit (INDEPENDENT_AMBULATORY_CARE_PROVIDER_SITE_OTHER): Payer: Managed Care, Other (non HMO)

## 2012-08-01 DIAGNOSIS — J309 Allergic rhinitis, unspecified: Secondary | ICD-10-CM

## 2012-08-08 ENCOUNTER — Ambulatory Visit (INDEPENDENT_AMBULATORY_CARE_PROVIDER_SITE_OTHER): Payer: Managed Care, Other (non HMO)

## 2012-08-08 DIAGNOSIS — J309 Allergic rhinitis, unspecified: Secondary | ICD-10-CM

## 2012-08-19 ENCOUNTER — Ambulatory Visit (INDEPENDENT_AMBULATORY_CARE_PROVIDER_SITE_OTHER): Payer: Managed Care, Other (non HMO)

## 2012-08-19 DIAGNOSIS — J309 Allergic rhinitis, unspecified: Secondary | ICD-10-CM

## 2012-08-25 ENCOUNTER — Ambulatory Visit (INDEPENDENT_AMBULATORY_CARE_PROVIDER_SITE_OTHER): Payer: Managed Care, Other (non HMO)

## 2012-08-25 DIAGNOSIS — J309 Allergic rhinitis, unspecified: Secondary | ICD-10-CM

## 2012-08-30 ENCOUNTER — Ambulatory Visit (INDEPENDENT_AMBULATORY_CARE_PROVIDER_SITE_OTHER): Payer: Managed Care, Other (non HMO)

## 2012-08-30 DIAGNOSIS — J309 Allergic rhinitis, unspecified: Secondary | ICD-10-CM

## 2012-09-01 ENCOUNTER — Ambulatory Visit: Payer: Managed Care, Other (non HMO) | Admitting: Internal Medicine

## 2012-09-02 ENCOUNTER — Ambulatory Visit (INDEPENDENT_AMBULATORY_CARE_PROVIDER_SITE_OTHER): Payer: Managed Care, Other (non HMO)

## 2012-09-02 DIAGNOSIS — J309 Allergic rhinitis, unspecified: Secondary | ICD-10-CM

## 2012-09-06 ENCOUNTER — Encounter: Payer: Self-pay | Admitting: Internal Medicine

## 2012-09-06 ENCOUNTER — Ambulatory Visit (INDEPENDENT_AMBULATORY_CARE_PROVIDER_SITE_OTHER): Payer: Managed Care, Other (non HMO) | Admitting: Internal Medicine

## 2012-09-06 ENCOUNTER — Ambulatory Visit (INDEPENDENT_AMBULATORY_CARE_PROVIDER_SITE_OTHER): Payer: Managed Care, Other (non HMO)

## 2012-09-06 VITALS — BP 118/78 | HR 67 | Ht 66.0 in | Wt 210.4 lb

## 2012-09-06 DIAGNOSIS — G4733 Obstructive sleep apnea (adult) (pediatric): Secondary | ICD-10-CM

## 2012-09-06 DIAGNOSIS — J309 Allergic rhinitis, unspecified: Secondary | ICD-10-CM

## 2012-09-06 DIAGNOSIS — Z23 Encounter for immunization: Secondary | ICD-10-CM

## 2012-09-06 DIAGNOSIS — J301 Allergic rhinitis due to pollen: Secondary | ICD-10-CM

## 2012-09-06 NOTE — Patient Instructions (Addendum)
We can continue allergy vaccine  For the morning drowsiness 1) Try for more sleep on work nights 2) Try 1/2 of a caffeine caplet (otc) with breakfast.  Please call as needed  Flu vax

## 2012-09-06 NOTE — Progress Notes (Signed)
Patient ID: Ralph Ware, male    DOB: November 13, 1956, 56 y.o.   MRN: [id number]  HPI 09/02/11- 63 yoM never smoker followed for OSA, allergic rhinitis, complicated by HBP. Last here March 02, 2011 Noting a little early fall nasal congestion. Had been taking 1/2 tab Xyzal and will go up to a full tab if needed. Rarely uses Astepro.  Allergy vaccine is doing fine. Using Azasite (azithromycin) eyedrop for antibacterial and antiinflammatory effects per his opthalmologist.  CPAP good- all night every night  10/13/11- 55 yoM never smoker followed for OSA, allergic rhinitis, complicated by HBP. Acute visit-shortly after last visit a month ago, laryngitis began as an allergy/cold with increasing thick postnasal drainage turning yellow. Wife had died 2 weeks ago (mixed connective tissue disease, MI) and he has been under a great deal of stress with much need to use his voice to speak. Difficult to get voice rest. Progressive hoarseness. Neti pot. Nose and throat are getting clearer and voices beginning to improve. He has had long-standing concern about his voice quality anyway and would like an ENT evaluation because of chronic raspy voice. He denies aspiration or choking with meals. He discontinued sleeping every night with CPAP, using a humidifier.  09/06/12-  56 yoM never smoker followed for OSA, allergic rhinitis, complicated by HBP. Slight sneezing in past weeks but not much to speak of; great spring this year; still on vaccine 1:10 GH and doing well. Living alone since his wife died. 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 He thinks he sleeps well. Bedtime 11 PM, at 5:30 AM, later on weekends. Admits this may just be enough sleep.  Review of Systems-see HPI Constitutional:   No-   weight loss, night sweats, fevers, chills, +fatigue, lassitude. HEENT:   No-  headaches, difficulty swallowing, tooth/dental problems, sore  throat,       No-  sneezing, itching, ear ache,    + nasal congestion, post nasal drip,  CV:  No-   chest pain, orthopnea, PND, swelling in lower extremities, anasarca,  dizziness, palpitations Resp: No-   shortness of breath with exertion or at rest.              No-   productive cough,  No non-productive cough,  No-  coughing up of blood.              No-   change in color of mucus.  No- wheezing.   Skin: No-   rash or lesions. GI:  No-   heartburn, indigestion, abdominal pain, nausea, vomiting, GU:. MS:  No-   joint pain or swelling.  . Neuro- nothing unusual Psych:  No- change in mood or affect. +depression or anxiety.  No memory loss.  Objective:   Physical Exam General- Alert, Oriented, Affect-appropriate, Distress- none acute  Stocky build Skin- rash-none, lesions- none, excoriation- none Lymphadenopathy- none Head- atraumatic            Eyes- Gross vision intact, PERRLA, conjunctivae clear secretions. Periorbital edema            Ears- Hearing, canals- normal            Nose- Clear with turbinate edema, No-Septal dev, mucus is watery ,No- polyps, erosion, perforation             Throat- Mallampati III-IV , mucosa red , drainage- none, tonsils- atrophic. Hoarse. Neck- flexible , trachea midline, no  stridor , thyroid nl, carotid no bruit Chest - symmetrical excursion , unlabored           Heart/CV- RRR , no murmur , no gallop  , no rub, nl s1 s2                           - JVD- none , edema- none, stasis changes- none, varices- none           Lung- clear to P&A, wheeze- none, cough- none , dullness-none, rub- none           Chest wall-  Abd-  Br/ Gen/ Rectal- Not done, not indicated Extrem- cyanosis- none, clubbing, none, atrophy- none, strength- nl Neuro- grossly intact to observation

## 2012-09-12 ENCOUNTER — Ambulatory Visit (INDEPENDENT_AMBULATORY_CARE_PROVIDER_SITE_OTHER): Payer: Managed Care, Other (non HMO)

## 2012-09-12 DIAGNOSIS — J309 Allergic rhinitis, unspecified: Secondary | ICD-10-CM

## 2012-09-15 NOTE — Assessment & Plan Note (Signed)
He continues allergy vaccine at 1:10 well.

## 2012-09-15 NOTE — Assessment & Plan Note (Signed)
Good CPAP compliance and control. Daytime sleepiness. I suggested more sleep and light dose of morning caffeine. Widowed and no longer has any body to tell him of changes in his sleep

## 2012-09-23 ENCOUNTER — Ambulatory Visit (INDEPENDENT_AMBULATORY_CARE_PROVIDER_SITE_OTHER): Payer: Managed Care, Other (non HMO)

## 2012-09-23 DIAGNOSIS — J309 Allergic rhinitis, unspecified: Secondary | ICD-10-CM

## 2012-09-27 ENCOUNTER — Ambulatory Visit (INDEPENDENT_AMBULATORY_CARE_PROVIDER_SITE_OTHER): Payer: Managed Care, Other (non HMO)

## 2012-09-27 DIAGNOSIS — J309 Allergic rhinitis, unspecified: Secondary | ICD-10-CM

## 2012-10-04 ENCOUNTER — Ambulatory Visit (INDEPENDENT_AMBULATORY_CARE_PROVIDER_SITE_OTHER): Payer: Managed Care, Other (non HMO)

## 2012-10-04 DIAGNOSIS — J309 Allergic rhinitis, unspecified: Secondary | ICD-10-CM

## 2012-10-12 ENCOUNTER — Ambulatory Visit (INDEPENDENT_AMBULATORY_CARE_PROVIDER_SITE_OTHER): Payer: Managed Care, Other (non HMO)

## 2012-10-12 DIAGNOSIS — J309 Allergic rhinitis, unspecified: Secondary | ICD-10-CM

## 2012-10-19 ENCOUNTER — Ambulatory Visit (INDEPENDENT_AMBULATORY_CARE_PROVIDER_SITE_OTHER): Payer: Managed Care, Other (non HMO)

## 2012-10-19 DIAGNOSIS — J309 Allergic rhinitis, unspecified: Secondary | ICD-10-CM

## 2012-10-28 ENCOUNTER — Ambulatory Visit (INDEPENDENT_AMBULATORY_CARE_PROVIDER_SITE_OTHER): Payer: Managed Care, Other (non HMO)

## 2012-10-28 DIAGNOSIS — J309 Allergic rhinitis, unspecified: Secondary | ICD-10-CM

## 2012-11-04 ENCOUNTER — Other Ambulatory Visit: Payer: Self-pay | Admitting: *Deleted

## 2012-11-04 MED ORDER — LEVOCETIRIZINE DIHYDROCHLORIDE 5 MG PO TABS: 2.5000 mg | ORAL_TABLET | Freq: Two times a day (BID) | ORAL | Status: DC

## 2012-11-08 ENCOUNTER — Ambulatory Visit (INDEPENDENT_AMBULATORY_CARE_PROVIDER_SITE_OTHER): Payer: Managed Care, Other (non HMO)

## 2012-11-08 DIAGNOSIS — J309 Allergic rhinitis, unspecified: Secondary | ICD-10-CM

## 2012-11-15 ENCOUNTER — Ambulatory Visit (INDEPENDENT_AMBULATORY_CARE_PROVIDER_SITE_OTHER): Payer: Managed Care, Other (non HMO)

## 2012-11-15 DIAGNOSIS — J309 Allergic rhinitis, unspecified: Secondary | ICD-10-CM

## 2012-11-21 ENCOUNTER — Ambulatory Visit (INDEPENDENT_AMBULATORY_CARE_PROVIDER_SITE_OTHER): Payer: Managed Care, Other (non HMO)

## 2012-11-21 DIAGNOSIS — J309 Allergic rhinitis, unspecified: Secondary | ICD-10-CM

## 2012-11-29 ENCOUNTER — Ambulatory Visit (INDEPENDENT_AMBULATORY_CARE_PROVIDER_SITE_OTHER): Payer: Managed Care, Other (non HMO)

## 2012-11-29 DIAGNOSIS — J309 Allergic rhinitis, unspecified: Secondary | ICD-10-CM

## 2012-12-05 ENCOUNTER — Ambulatory Visit (INDEPENDENT_AMBULATORY_CARE_PROVIDER_SITE_OTHER): Payer: Managed Care, Other (non HMO)

## 2012-12-05 DIAGNOSIS — J309 Allergic rhinitis, unspecified: Secondary | ICD-10-CM

## 2012-12-16 ENCOUNTER — Ambulatory Visit (INDEPENDENT_AMBULATORY_CARE_PROVIDER_SITE_OTHER): Payer: Managed Care, Other (non HMO)

## 2012-12-16 DIAGNOSIS — J309 Allergic rhinitis, unspecified: Secondary | ICD-10-CM

## 2012-12-19 ENCOUNTER — Ambulatory Visit (INDEPENDENT_AMBULATORY_CARE_PROVIDER_SITE_OTHER): Payer: Managed Care, Other (non HMO)

## 2012-12-19 DIAGNOSIS — J309 Allergic rhinitis, unspecified: Secondary | ICD-10-CM

## 2012-12-20 ENCOUNTER — Encounter: Payer: Self-pay | Admitting: Internal Medicine

## 2012-12-30 ENCOUNTER — Ambulatory Visit: Payer: Managed Care, Other (non HMO)

## 2012-12-30 ENCOUNTER — Ambulatory Visit (INDEPENDENT_AMBULATORY_CARE_PROVIDER_SITE_OTHER): Payer: Managed Care, Other (non HMO)

## 2012-12-30 DIAGNOSIS — J309 Allergic rhinitis, unspecified: Secondary | ICD-10-CM

## 2013-01-02 ENCOUNTER — Ambulatory Visit: Payer: Managed Care, Other (non HMO)

## 2013-01-03 ENCOUNTER — Ambulatory Visit (INDEPENDENT_AMBULATORY_CARE_PROVIDER_SITE_OTHER): Payer: Managed Care, Other (non HMO)

## 2013-01-03 DIAGNOSIS — J309 Allergic rhinitis, unspecified: Secondary | ICD-10-CM

## 2013-01-05 ENCOUNTER — Ambulatory Visit (INDEPENDENT_AMBULATORY_CARE_PROVIDER_SITE_OTHER): Payer: Managed Care, Other (non HMO)

## 2013-01-05 DIAGNOSIS — J309 Allergic rhinitis, unspecified: Secondary | ICD-10-CM

## 2013-01-10 ENCOUNTER — Ambulatory Visit (INDEPENDENT_AMBULATORY_CARE_PROVIDER_SITE_OTHER): Payer: Managed Care, Other (non HMO)

## 2013-01-10 DIAGNOSIS — J309 Allergic rhinitis, unspecified: Secondary | ICD-10-CM

## 2013-01-17 ENCOUNTER — Ambulatory Visit (INDEPENDENT_AMBULATORY_CARE_PROVIDER_SITE_OTHER): Payer: Managed Care, Other (non HMO)

## 2013-01-17 DIAGNOSIS — J309 Allergic rhinitis, unspecified: Secondary | ICD-10-CM

## 2013-01-24 ENCOUNTER — Ambulatory Visit (INDEPENDENT_AMBULATORY_CARE_PROVIDER_SITE_OTHER): Payer: Managed Care, Other (non HMO)

## 2013-01-24 DIAGNOSIS — J309 Allergic rhinitis, unspecified: Secondary | ICD-10-CM

## 2013-01-31 ENCOUNTER — Ambulatory Visit (INDEPENDENT_AMBULATORY_CARE_PROVIDER_SITE_OTHER): Payer: Managed Care, Other (non HMO)

## 2013-01-31 DIAGNOSIS — J309 Allergic rhinitis, unspecified: Secondary | ICD-10-CM

## 2013-02-07 ENCOUNTER — Ambulatory Visit: Payer: Managed Care, Other (non HMO)

## 2013-02-10 ENCOUNTER — Ambulatory Visit (INDEPENDENT_AMBULATORY_CARE_PROVIDER_SITE_OTHER): Payer: Managed Care, Other (non HMO)

## 2013-02-10 DIAGNOSIS — J309 Allergic rhinitis, unspecified: Secondary | ICD-10-CM

## 2013-02-14 ENCOUNTER — Ambulatory Visit (INDEPENDENT_AMBULATORY_CARE_PROVIDER_SITE_OTHER): Payer: Managed Care, Other (non HMO)

## 2013-02-21 ENCOUNTER — Ambulatory Visit (INDEPENDENT_AMBULATORY_CARE_PROVIDER_SITE_OTHER): Payer: Managed Care, Other (non HMO)

## 2013-02-21 DIAGNOSIS — J309 Allergic rhinitis, unspecified: Secondary | ICD-10-CM

## 2013-02-28 ENCOUNTER — Ambulatory Visit (INDEPENDENT_AMBULATORY_CARE_PROVIDER_SITE_OTHER): Payer: Managed Care, Other (non HMO)

## 2013-03-07 ENCOUNTER — Ambulatory Visit (INDEPENDENT_AMBULATORY_CARE_PROVIDER_SITE_OTHER): Payer: Managed Care, Other (non HMO)

## 2013-03-14 ENCOUNTER — Ambulatory Visit (INDEPENDENT_AMBULATORY_CARE_PROVIDER_SITE_OTHER): Payer: Managed Care, Other (non HMO)

## 2013-03-14 DIAGNOSIS — J309 Allergic rhinitis, unspecified: Secondary | ICD-10-CM

## 2013-03-21 ENCOUNTER — Ambulatory Visit (INDEPENDENT_AMBULATORY_CARE_PROVIDER_SITE_OTHER): Payer: Managed Care, Other (non HMO)

## 2013-03-21 DIAGNOSIS — J309 Allergic rhinitis, unspecified: Secondary | ICD-10-CM

## 2013-03-27 ENCOUNTER — Encounter: Payer: Self-pay | Admitting: Internal Medicine

## 2013-03-28 ENCOUNTER — Ambulatory Visit (INDEPENDENT_AMBULATORY_CARE_PROVIDER_SITE_OTHER): Payer: Managed Care, Other (non HMO)

## 2013-03-28 DIAGNOSIS — J309 Allergic rhinitis, unspecified: Secondary | ICD-10-CM

## 2013-04-04 ENCOUNTER — Ambulatory Visit (INDEPENDENT_AMBULATORY_CARE_PROVIDER_SITE_OTHER): Payer: Managed Care, Other (non HMO)

## 2013-04-04 DIAGNOSIS — J309 Allergic rhinitis, unspecified: Secondary | ICD-10-CM

## 2013-04-11 ENCOUNTER — Ambulatory Visit (INDEPENDENT_AMBULATORY_CARE_PROVIDER_SITE_OTHER): Payer: Managed Care, Other (non HMO)

## 2013-04-11 DIAGNOSIS — J309 Allergic rhinitis, unspecified: Secondary | ICD-10-CM

## 2013-04-18 ENCOUNTER — Ambulatory Visit (INDEPENDENT_AMBULATORY_CARE_PROVIDER_SITE_OTHER): Payer: Managed Care, Other (non HMO)

## 2013-04-18 DIAGNOSIS — J309 Allergic rhinitis, unspecified: Secondary | ICD-10-CM

## 2013-04-19 ENCOUNTER — Ambulatory Visit (INDEPENDENT_AMBULATORY_CARE_PROVIDER_SITE_OTHER): Payer: Managed Care, Other (non HMO)

## 2013-04-19 DIAGNOSIS — J309 Allergic rhinitis, unspecified: Secondary | ICD-10-CM

## 2013-04-25 ENCOUNTER — Ambulatory Visit (INDEPENDENT_AMBULATORY_CARE_PROVIDER_SITE_OTHER): Payer: Managed Care, Other (non HMO)

## 2013-04-25 DIAGNOSIS — J309 Allergic rhinitis, unspecified: Secondary | ICD-10-CM

## 2013-05-02 ENCOUNTER — Ambulatory Visit (INDEPENDENT_AMBULATORY_CARE_PROVIDER_SITE_OTHER): Payer: Managed Care, Other (non HMO)

## 2013-05-02 DIAGNOSIS — J309 Allergic rhinitis, unspecified: Secondary | ICD-10-CM

## 2013-05-09 ENCOUNTER — Ambulatory Visit (INDEPENDENT_AMBULATORY_CARE_PROVIDER_SITE_OTHER): Payer: Managed Care, Other (non HMO)

## 2013-05-09 DIAGNOSIS — J309 Allergic rhinitis, unspecified: Secondary | ICD-10-CM

## 2013-05-16 ENCOUNTER — Ambulatory Visit (INDEPENDENT_AMBULATORY_CARE_PROVIDER_SITE_OTHER): Payer: Managed Care, Other (non HMO)

## 2013-05-16 DIAGNOSIS — J309 Allergic rhinitis, unspecified: Secondary | ICD-10-CM

## 2013-05-23 ENCOUNTER — Ambulatory Visit (INDEPENDENT_AMBULATORY_CARE_PROVIDER_SITE_OTHER): Payer: Managed Care, Other (non HMO)

## 2013-05-23 DIAGNOSIS — J309 Allergic rhinitis, unspecified: Secondary | ICD-10-CM

## 2013-05-30 ENCOUNTER — Ambulatory Visit (INDEPENDENT_AMBULATORY_CARE_PROVIDER_SITE_OTHER): Payer: Managed Care, Other (non HMO)

## 2013-05-30 DIAGNOSIS — J309 Allergic rhinitis, unspecified: Secondary | ICD-10-CM

## 2013-06-06 ENCOUNTER — Ambulatory Visit (INDEPENDENT_AMBULATORY_CARE_PROVIDER_SITE_OTHER): Payer: Managed Care, Other (non HMO)

## 2013-06-06 DIAGNOSIS — J309 Allergic rhinitis, unspecified: Secondary | ICD-10-CM

## 2013-06-13 ENCOUNTER — Ambulatory Visit: Payer: Managed Care, Other (non HMO)

## 2013-06-14 ENCOUNTER — Ambulatory Visit (INDEPENDENT_AMBULATORY_CARE_PROVIDER_SITE_OTHER): Payer: Managed Care, Other (non HMO)

## 2013-06-14 DIAGNOSIS — J309 Allergic rhinitis, unspecified: Secondary | ICD-10-CM

## 2013-06-20 ENCOUNTER — Ambulatory Visit (INDEPENDENT_AMBULATORY_CARE_PROVIDER_SITE_OTHER): Payer: Managed Care, Other (non HMO)

## 2013-06-20 DIAGNOSIS — J309 Allergic rhinitis, unspecified: Secondary | ICD-10-CM

## 2013-06-27 ENCOUNTER — Ambulatory Visit (INDEPENDENT_AMBULATORY_CARE_PROVIDER_SITE_OTHER): Payer: Managed Care, Other (non HMO)

## 2013-06-27 DIAGNOSIS — J309 Allergic rhinitis, unspecified: Secondary | ICD-10-CM

## 2013-07-05 ENCOUNTER — Ambulatory Visit (INDEPENDENT_AMBULATORY_CARE_PROVIDER_SITE_OTHER): Payer: Managed Care, Other (non HMO)

## 2013-07-05 DIAGNOSIS — J309 Allergic rhinitis, unspecified: Secondary | ICD-10-CM

## 2013-07-11 ENCOUNTER — Ambulatory Visit: Payer: Managed Care, Other (non HMO)

## 2013-07-12 ENCOUNTER — Ambulatory Visit (INDEPENDENT_AMBULATORY_CARE_PROVIDER_SITE_OTHER): Payer: Managed Care, Other (non HMO)

## 2013-07-12 DIAGNOSIS — J309 Allergic rhinitis, unspecified: Secondary | ICD-10-CM

## 2013-07-18 ENCOUNTER — Ambulatory Visit (INDEPENDENT_AMBULATORY_CARE_PROVIDER_SITE_OTHER): Payer: Managed Care, Other (non HMO)

## 2013-07-18 DIAGNOSIS — J309 Allergic rhinitis, unspecified: Secondary | ICD-10-CM

## 2013-07-25 ENCOUNTER — Ambulatory Visit (INDEPENDENT_AMBULATORY_CARE_PROVIDER_SITE_OTHER): Payer: Managed Care, Other (non HMO)

## 2013-07-25 DIAGNOSIS — J309 Allergic rhinitis, unspecified: Secondary | ICD-10-CM

## 2013-08-01 ENCOUNTER — Ambulatory Visit: Payer: Managed Care, Other (non HMO)

## 2013-08-02 ENCOUNTER — Ambulatory Visit (INDEPENDENT_AMBULATORY_CARE_PROVIDER_SITE_OTHER): Payer: Managed Care, Other (non HMO)

## 2013-08-02 DIAGNOSIS — J309 Allergic rhinitis, unspecified: Secondary | ICD-10-CM

## 2013-08-08 ENCOUNTER — Ambulatory Visit (INDEPENDENT_AMBULATORY_CARE_PROVIDER_SITE_OTHER): Payer: Managed Care, Other (non HMO)

## 2013-08-08 DIAGNOSIS — I2789 Other specified pulmonary heart diseases: Secondary | ICD-10-CM

## 2013-08-09 ENCOUNTER — Ambulatory Visit (INDEPENDENT_AMBULATORY_CARE_PROVIDER_SITE_OTHER): Payer: Managed Care, Other (non HMO)

## 2013-08-09 DIAGNOSIS — J309 Allergic rhinitis, unspecified: Secondary | ICD-10-CM

## 2013-08-15 ENCOUNTER — Ambulatory Visit (INDEPENDENT_AMBULATORY_CARE_PROVIDER_SITE_OTHER): Payer: Managed Care, Other (non HMO)

## 2013-08-15 DIAGNOSIS — J309 Allergic rhinitis, unspecified: Secondary | ICD-10-CM

## 2013-08-22 ENCOUNTER — Ambulatory Visit: Payer: Managed Care, Other (non HMO)

## 2013-08-23 ENCOUNTER — Ambulatory Visit (INDEPENDENT_AMBULATORY_CARE_PROVIDER_SITE_OTHER): Payer: Managed Care, Other (non HMO)

## 2013-08-23 DIAGNOSIS — J309 Allergic rhinitis, unspecified: Secondary | ICD-10-CM

## 2013-08-29 ENCOUNTER — Ambulatory Visit (INDEPENDENT_AMBULATORY_CARE_PROVIDER_SITE_OTHER): Payer: Managed Care, Other (non HMO)

## 2013-08-29 DIAGNOSIS — J309 Allergic rhinitis, unspecified: Secondary | ICD-10-CM

## 2013-09-05 ENCOUNTER — Ambulatory Visit: Payer: Managed Care, Other (non HMO)

## 2013-09-06 ENCOUNTER — Encounter: Payer: Self-pay | Admitting: Internal Medicine

## 2013-09-06 ENCOUNTER — Ambulatory Visit (INDEPENDENT_AMBULATORY_CARE_PROVIDER_SITE_OTHER): Payer: Managed Care, Other (non HMO)

## 2013-09-06 ENCOUNTER — Ambulatory Visit (INDEPENDENT_AMBULATORY_CARE_PROVIDER_SITE_OTHER): Payer: Managed Care, Other (non HMO) | Admitting: Internal Medicine

## 2013-09-06 VITALS — BP 112/70 | HR 77 | Ht 65.25 in | Wt 214.6 lb

## 2013-09-06 DIAGNOSIS — G4733 Obstructive sleep apnea (adult) (pediatric): Secondary | ICD-10-CM

## 2013-09-06 DIAGNOSIS — J309 Allergic rhinitis, unspecified: Secondary | ICD-10-CM

## 2013-09-06 DIAGNOSIS — Z23 Encounter for immunization: Secondary | ICD-10-CM

## 2013-09-06 DIAGNOSIS — J301 Allergic rhinitis due to pollen: Secondary | ICD-10-CM

## 2013-09-06 NOTE — Patient Instructions (Addendum)
Flu vax  We can continue CPAP 8/ [employer]  We can continue allergy vaccine 1:10 GH  Please call as needed

## 2013-09-06 NOTE — Progress Notes (Signed)
Patient ID: Ralph Ware, male    DOB: [birth year], 57 y.o.   MRN: [id number]  HPI 09/02/11- 3 yoM never smoker followed for OSA, allergic rhinitis, complicated by HBP. Last here March 02, 2011 Noting a little early fall nasal congestion. Had been taking 1/2 tab Xyzal and will go up to a full tab if needed. Rarely uses Astepro.  Allergy vaccine is doing fine. Using Azasite (azithromycin) eyedrop for antibacterial and antiinflammatory effects per his opthalmologist.  CPAP good- all night every night  10/13/11- 55 yoM never smoker followed for OSA, allergic rhinitis, complicated by HBP. Acute visit-shortly after last visit a month ago, laryngitis began as an allergy/cold with increasing thick postnasal drainage turning yellow. Wife had died 2 weeks ago (mixed connective tissue disease, MI) and he has been under a great deal of stress with much need to use his voice to speak. Difficult to get voice rest. Progressive hoarseness. Neti pot. Nose and throat are getting clearer and voices beginning to improve. He has had long-standing concern about his voice quality anyway and would like an ENT evaluation because of chronic raspy voice. He denies aspiration or choking with meals. He discontinued sleeping every night with CPAP, using a humidifier.  09/06/12-  56 yoM never smoker followed for OSA, allergic rhinitis, complicated by HBP. Slight sneezing in past weeks but not much to speak of; great spring this year; still on vaccine 1:10 GH and doing well. Living alone since his wife died. 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 He thinks he sleeps well. Bedtime 11 PM, at 5:30 AM, later on weekends. Admits this may just be not enough sleep.  09/06/13- 57 yoM never smoker followed for OSA, allergic rhinitis, complicated by HBP. FOLLOWS FOR: still on Allergy vaccine 1:10 GH and doing well. He experienced nasal irritation which he  blamed on beach air, resolved on return home from vacation. Continues CPAP 8/[facility] with good control, use every night. No concerns.  Review of Systems-see HPI Constitutional:   No-   weight loss, night sweats, fevers, chills, +fatigue, lassitude. HEENT:   No-  headaches, difficulty swallowing, tooth/dental problems, sore throat,       No-  sneezing, itching, ear ache, + nasal congestion, post nasal drip,  CV:  No-   chest pain, orthopnea, PND, swelling in lower extremities, anasarca,  dizziness, palpitations Resp: No-   shortness of breath with exertion or at rest.              No-   productive cough,  No non-productive cough,  No-  coughing up of blood.              No-   change in color of mucus.  No- wheezing.   Skin: No-   rash or lesions. GI:  No-   heartburn, indigestion, abdominal pain, nausea, vomiting, GU:. MS:  No-   joint pain or swelling.  . Neuro- nothing unusual Psych:  No- change in mood or affect. +depression or anxiety.  No memory loss.  Objective:   Physical Exam General- Alert, Oriented, Affect-appropriate, Distress- none acute  Stocky build Skin- rash-none, lesions- none, excoriation- none Lymphadenopathy- none Head- atraumatic            Eyes- Gross vision intact, PERRLA, conjunctivae clear secretions. Periorbital edema            Ears- Hearing, canals- normal  Nose- Clear with turbinate edema, No-Septal dev, mucus is watery ,No- polyps, erosion, perforation             Throat- Mallampati III-IV , mucosa red , drainage- none, tonsils- atrophic. Hoarse. Neck- flexible , trachea midline, no stridor , thyroid nl, carotid no bruit Chest - symmetrical excursion , unlabored           Heart/CV- RRR , no murmur , no gallop  , no rub, nl s1 s2                           - JVD- none , edema- none, stasis changes- none, varices- none           Lung- clear to P&A, wheeze- none, cough- none , dullness-none, rub- none           Chest wall-  Abd-  Br/ Gen/ Rectal-  Not done, not indicated Extrem- cyanosis- none, clubbing, none, atrophy- none, strength- nl Neuro- grossly intact to observation

## 2013-09-12 ENCOUNTER — Ambulatory Visit (INDEPENDENT_AMBULATORY_CARE_PROVIDER_SITE_OTHER): Payer: Managed Care, Other (non HMO)

## 2013-09-12 DIAGNOSIS — J309 Allergic rhinitis, unspecified: Secondary | ICD-10-CM

## 2013-09-15 NOTE — Assessment & Plan Note (Signed)
He does well as he continues allergy vaccine. He is satisfied that it is helping him. Plan-flu vaccine

## 2013-09-15 NOTE — Assessment & Plan Note (Signed)
Good compliance and control with no expressed concerns.

## 2013-09-19 ENCOUNTER — Ambulatory Visit: Payer: Managed Care, Other (non HMO)

## 2013-09-21 ENCOUNTER — Ambulatory Visit (INDEPENDENT_AMBULATORY_CARE_PROVIDER_SITE_OTHER): Payer: Managed Care, Other (non HMO)

## 2013-09-21 DIAGNOSIS — J309 Allergic rhinitis, unspecified: Secondary | ICD-10-CM

## 2013-09-26 ENCOUNTER — Ambulatory Visit (INDEPENDENT_AMBULATORY_CARE_PROVIDER_SITE_OTHER): Payer: Managed Care, Other (non HMO)

## 2013-09-26 DIAGNOSIS — J309 Allergic rhinitis, unspecified: Secondary | ICD-10-CM

## 2013-10-03 ENCOUNTER — Ambulatory Visit (INDEPENDENT_AMBULATORY_CARE_PROVIDER_SITE_OTHER): Payer: Managed Care, Other (non HMO)

## 2013-10-03 DIAGNOSIS — J309 Allergic rhinitis, unspecified: Secondary | ICD-10-CM

## 2013-10-10 ENCOUNTER — Ambulatory Visit (INDEPENDENT_AMBULATORY_CARE_PROVIDER_SITE_OTHER): Payer: Managed Care, Other (non HMO)

## 2013-10-10 DIAGNOSIS — J309 Allergic rhinitis, unspecified: Secondary | ICD-10-CM

## 2013-10-17 ENCOUNTER — Ambulatory Visit (INDEPENDENT_AMBULATORY_CARE_PROVIDER_SITE_OTHER): Payer: Managed Care, Other (non HMO)

## 2013-10-17 DIAGNOSIS — J309 Allergic rhinitis, unspecified: Secondary | ICD-10-CM

## 2013-10-24 ENCOUNTER — Ambulatory Visit (INDEPENDENT_AMBULATORY_CARE_PROVIDER_SITE_OTHER): Payer: Managed Care, Other (non HMO)

## 2013-10-24 DIAGNOSIS — J309 Allergic rhinitis, unspecified: Secondary | ICD-10-CM

## 2013-10-31 ENCOUNTER — Ambulatory Visit (INDEPENDENT_AMBULATORY_CARE_PROVIDER_SITE_OTHER): Payer: Managed Care, Other (non HMO)

## 2013-10-31 DIAGNOSIS — J309 Allergic rhinitis, unspecified: Secondary | ICD-10-CM

## 2013-11-07 ENCOUNTER — Ambulatory Visit: Payer: Managed Care, Other (non HMO)

## 2013-11-10 ENCOUNTER — Ambulatory Visit (INDEPENDENT_AMBULATORY_CARE_PROVIDER_SITE_OTHER): Payer: Managed Care, Other (non HMO)

## 2013-11-10 DIAGNOSIS — J309 Allergic rhinitis, unspecified: Secondary | ICD-10-CM

## 2013-11-14 ENCOUNTER — Ambulatory Visit (INDEPENDENT_AMBULATORY_CARE_PROVIDER_SITE_OTHER): Payer: Managed Care, Other (non HMO)

## 2013-11-14 DIAGNOSIS — J309 Allergic rhinitis, unspecified: Secondary | ICD-10-CM

## 2013-11-21 ENCOUNTER — Ambulatory Visit: Payer: Managed Care, Other (non HMO)

## 2013-11-28 ENCOUNTER — Ambulatory Visit (INDEPENDENT_AMBULATORY_CARE_PROVIDER_SITE_OTHER): Payer: Managed Care, Other (non HMO)

## 2013-11-28 DIAGNOSIS — J309 Allergic rhinitis, unspecified: Secondary | ICD-10-CM

## 2013-12-05 ENCOUNTER — Ambulatory Visit (INDEPENDENT_AMBULATORY_CARE_PROVIDER_SITE_OTHER): Payer: Managed Care, Other (non HMO)

## 2013-12-05 DIAGNOSIS — J309 Allergic rhinitis, unspecified: Secondary | ICD-10-CM

## 2013-12-11 ENCOUNTER — Ambulatory Visit (INDEPENDENT_AMBULATORY_CARE_PROVIDER_SITE_OTHER): Payer: Managed Care, Other (non HMO)

## 2013-12-11 DIAGNOSIS — J309 Allergic rhinitis, unspecified: Secondary | ICD-10-CM

## 2013-12-12 ENCOUNTER — Ambulatory Visit: Payer: Managed Care, Other (non HMO)

## 2013-12-19 ENCOUNTER — Ambulatory Visit (INDEPENDENT_AMBULATORY_CARE_PROVIDER_SITE_OTHER): Payer: Managed Care, Other (non HMO)

## 2013-12-19 DIAGNOSIS — J309 Allergic rhinitis, unspecified: Secondary | ICD-10-CM

## 2013-12-20 ENCOUNTER — Ambulatory Visit (INDEPENDENT_AMBULATORY_CARE_PROVIDER_SITE_OTHER): Payer: Managed Care, Other (non HMO)

## 2013-12-20 DIAGNOSIS — J309 Allergic rhinitis, unspecified: Secondary | ICD-10-CM

## 2013-12-22 ENCOUNTER — Encounter: Payer: Self-pay | Admitting: Internal Medicine

## 2013-12-26 ENCOUNTER — Ambulatory Visit (INDEPENDENT_AMBULATORY_CARE_PROVIDER_SITE_OTHER): Payer: 59

## 2013-12-26 DIAGNOSIS — J309 Allergic rhinitis, unspecified: Secondary | ICD-10-CM

## 2014-01-02 ENCOUNTER — Ambulatory Visit (INDEPENDENT_AMBULATORY_CARE_PROVIDER_SITE_OTHER): Payer: 59

## 2014-01-02 DIAGNOSIS — J309 Allergic rhinitis, unspecified: Secondary | ICD-10-CM

## 2014-01-08 ENCOUNTER — Ambulatory Visit (INDEPENDENT_AMBULATORY_CARE_PROVIDER_SITE_OTHER): Payer: 59

## 2014-01-08 DIAGNOSIS — J309 Allergic rhinitis, unspecified: Secondary | ICD-10-CM

## 2014-01-15 ENCOUNTER — Ambulatory Visit: Payer: 59

## 2014-01-19 ENCOUNTER — Ambulatory Visit (INDEPENDENT_AMBULATORY_CARE_PROVIDER_SITE_OTHER): Payer: 59

## 2014-01-19 DIAGNOSIS — J309 Allergic rhinitis, unspecified: Secondary | ICD-10-CM

## 2014-01-23 ENCOUNTER — Ambulatory Visit (INDEPENDENT_AMBULATORY_CARE_PROVIDER_SITE_OTHER): Payer: 59

## 2014-01-23 DIAGNOSIS — J309 Allergic rhinitis, unspecified: Secondary | ICD-10-CM

## 2014-01-30 ENCOUNTER — Ambulatory Visit (INDEPENDENT_AMBULATORY_CARE_PROVIDER_SITE_OTHER): Payer: 59

## 2014-01-30 DIAGNOSIS — J309 Allergic rhinitis, unspecified: Secondary | ICD-10-CM

## 2014-02-06 ENCOUNTER — Ambulatory Visit: Payer: 59

## 2014-02-08 ENCOUNTER — Ambulatory Visit (INDEPENDENT_AMBULATORY_CARE_PROVIDER_SITE_OTHER): Payer: 59

## 2014-02-08 DIAGNOSIS — J309 Allergic rhinitis, unspecified: Secondary | ICD-10-CM

## 2014-02-13 ENCOUNTER — Ambulatory Visit: Payer: 59

## 2014-02-14 ENCOUNTER — Ambulatory Visit (INDEPENDENT_AMBULATORY_CARE_PROVIDER_SITE_OTHER): Payer: 59

## 2014-02-14 DIAGNOSIS — J309 Allergic rhinitis, unspecified: Secondary | ICD-10-CM

## 2014-02-20 ENCOUNTER — Ambulatory Visit (INDEPENDENT_AMBULATORY_CARE_PROVIDER_SITE_OTHER): Payer: 59

## 2014-02-20 DIAGNOSIS — J309 Allergic rhinitis, unspecified: Secondary | ICD-10-CM

## 2014-02-27 ENCOUNTER — Ambulatory Visit (INDEPENDENT_AMBULATORY_CARE_PROVIDER_SITE_OTHER): Payer: 59

## 2014-02-27 DIAGNOSIS — J309 Allergic rhinitis, unspecified: Secondary | ICD-10-CM

## 2014-03-06 ENCOUNTER — Ambulatory Visit (INDEPENDENT_AMBULATORY_CARE_PROVIDER_SITE_OTHER): Payer: 59

## 2014-03-06 DIAGNOSIS — J309 Allergic rhinitis, unspecified: Secondary | ICD-10-CM

## 2014-03-13 ENCOUNTER — Ambulatory Visit: Payer: 59

## 2014-03-16 ENCOUNTER — Ambulatory Visit (INDEPENDENT_AMBULATORY_CARE_PROVIDER_SITE_OTHER): Payer: 59

## 2014-03-16 DIAGNOSIS — J309 Allergic rhinitis, unspecified: Secondary | ICD-10-CM

## 2014-03-19 ENCOUNTER — Ambulatory Visit: Payer: Managed Care, Other (non HMO)

## 2014-03-20 ENCOUNTER — Ambulatory Visit (INDEPENDENT_AMBULATORY_CARE_PROVIDER_SITE_OTHER): Payer: 59

## 2014-03-20 ENCOUNTER — Ambulatory Visit: Payer: 59

## 2014-03-20 DIAGNOSIS — J309 Allergic rhinitis, unspecified: Secondary | ICD-10-CM

## 2014-03-26 ENCOUNTER — Ambulatory Visit (INDEPENDENT_AMBULATORY_CARE_PROVIDER_SITE_OTHER): Payer: 59

## 2014-03-26 DIAGNOSIS — J309 Allergic rhinitis, unspecified: Secondary | ICD-10-CM

## 2014-03-27 ENCOUNTER — Ambulatory Visit: Payer: 59

## 2014-04-02 ENCOUNTER — Telehealth: Payer: Self-pay | Admitting: Internal Medicine

## 2014-04-02 MED ORDER — LEVOCETIRIZINE DIHYDROCHLORIDE 5 MG PO TABS: 2.5000 mg | ORAL_TABLET | Freq: Two times a day (BID) | ORAL | Status: DC

## 2014-04-02 NOTE — Telephone Encounter (Signed)
Ok to refill 

## 2014-04-02 NOTE — Telephone Encounter (Signed)
Pt aware RX has been called in. Nothing further needed 

## 2014-04-02 NOTE — Telephone Encounter (Signed)
We have not prescribed this medication for the pt since 2013.  CY - are you okay with us refilling this? Thanks.

## 2014-04-03 ENCOUNTER — Ambulatory Visit (INDEPENDENT_AMBULATORY_CARE_PROVIDER_SITE_OTHER): Payer: 59

## 2014-04-03 DIAGNOSIS — J309 Allergic rhinitis, unspecified: Secondary | ICD-10-CM

## 2014-04-10 ENCOUNTER — Ambulatory Visit: Payer: Managed Care, Other (non HMO)

## 2014-04-13 ENCOUNTER — Ambulatory Visit (INDEPENDENT_AMBULATORY_CARE_PROVIDER_SITE_OTHER): Payer: 59

## 2014-04-13 DIAGNOSIS — J309 Allergic rhinitis, unspecified: Secondary | ICD-10-CM

## 2014-04-17 ENCOUNTER — Ambulatory Visit (INDEPENDENT_AMBULATORY_CARE_PROVIDER_SITE_OTHER): Payer: 59

## 2014-04-17 DIAGNOSIS — J309 Allergic rhinitis, unspecified: Secondary | ICD-10-CM

## 2014-04-20 ENCOUNTER — Ambulatory Visit (INDEPENDENT_AMBULATORY_CARE_PROVIDER_SITE_OTHER): Payer: 59

## 2014-04-20 DIAGNOSIS — J309 Allergic rhinitis, unspecified: Secondary | ICD-10-CM

## 2014-04-24 ENCOUNTER — Ambulatory Visit: Payer: 59

## 2014-04-25 ENCOUNTER — Ambulatory Visit (INDEPENDENT_AMBULATORY_CARE_PROVIDER_SITE_OTHER): Payer: 59

## 2014-04-25 DIAGNOSIS — J309 Allergic rhinitis, unspecified: Secondary | ICD-10-CM

## 2014-05-01 ENCOUNTER — Ambulatory Visit (INDEPENDENT_AMBULATORY_CARE_PROVIDER_SITE_OTHER): Payer: 59

## 2014-05-01 DIAGNOSIS — J309 Allergic rhinitis, unspecified: Secondary | ICD-10-CM

## 2014-05-08 ENCOUNTER — Ambulatory Visit (INDEPENDENT_AMBULATORY_CARE_PROVIDER_SITE_OTHER): Payer: 59

## 2014-05-08 DIAGNOSIS — J309 Allergic rhinitis, unspecified: Secondary | ICD-10-CM

## 2014-05-15 ENCOUNTER — Ambulatory Visit (INDEPENDENT_AMBULATORY_CARE_PROVIDER_SITE_OTHER): Payer: 59

## 2014-05-15 DIAGNOSIS — J309 Allergic rhinitis, unspecified: Secondary | ICD-10-CM

## 2014-05-22 ENCOUNTER — Ambulatory Visit: Payer: 59

## 2014-05-23 ENCOUNTER — Encounter: Payer: Self-pay | Admitting: Internal Medicine

## 2014-05-25 ENCOUNTER — Ambulatory Visit (INDEPENDENT_AMBULATORY_CARE_PROVIDER_SITE_OTHER): Payer: Managed Care, Other (non HMO)

## 2014-05-25 DIAGNOSIS — J309 Allergic rhinitis, unspecified: Secondary | ICD-10-CM

## 2014-05-29 ENCOUNTER — Ambulatory Visit (INDEPENDENT_AMBULATORY_CARE_PROVIDER_SITE_OTHER): Payer: Managed Care, Other (non HMO)

## 2014-05-29 DIAGNOSIS — J309 Allergic rhinitis, unspecified: Secondary | ICD-10-CM

## 2014-06-05 ENCOUNTER — Ambulatory Visit: Payer: 59

## 2014-06-07 ENCOUNTER — Ambulatory Visit (INDEPENDENT_AMBULATORY_CARE_PROVIDER_SITE_OTHER): Payer: Managed Care, Other (non HMO)

## 2014-06-07 DIAGNOSIS — J309 Allergic rhinitis, unspecified: Secondary | ICD-10-CM

## 2014-06-12 ENCOUNTER — Ambulatory Visit (INDEPENDENT_AMBULATORY_CARE_PROVIDER_SITE_OTHER): Payer: Managed Care, Other (non HMO)

## 2014-06-12 ENCOUNTER — Ambulatory Visit: Payer: 59

## 2014-06-12 DIAGNOSIS — J309 Allergic rhinitis, unspecified: Secondary | ICD-10-CM

## 2014-06-14 ENCOUNTER — Ambulatory Visit: Payer: 59

## 2014-06-19 ENCOUNTER — Ambulatory Visit (INDEPENDENT_AMBULATORY_CARE_PROVIDER_SITE_OTHER): Payer: Managed Care, Other (non HMO)

## 2014-06-19 DIAGNOSIS — J309 Allergic rhinitis, unspecified: Secondary | ICD-10-CM

## 2014-06-26 ENCOUNTER — Ambulatory Visit (INDEPENDENT_AMBULATORY_CARE_PROVIDER_SITE_OTHER): Payer: Managed Care, Other (non HMO)

## 2014-06-26 DIAGNOSIS — J309 Allergic rhinitis, unspecified: Secondary | ICD-10-CM

## 2014-07-03 ENCOUNTER — Ambulatory Visit (INDEPENDENT_AMBULATORY_CARE_PROVIDER_SITE_OTHER): Payer: Managed Care, Other (non HMO)

## 2014-07-03 DIAGNOSIS — J309 Allergic rhinitis, unspecified: Secondary | ICD-10-CM

## 2014-07-12 ENCOUNTER — Ambulatory Visit (INDEPENDENT_AMBULATORY_CARE_PROVIDER_SITE_OTHER): Payer: Managed Care, Other (non HMO)

## 2014-07-12 DIAGNOSIS — J309 Allergic rhinitis, unspecified: Secondary | ICD-10-CM

## 2014-07-17 ENCOUNTER — Ambulatory Visit (INDEPENDENT_AMBULATORY_CARE_PROVIDER_SITE_OTHER): Payer: Managed Care, Other (non HMO)

## 2014-07-17 DIAGNOSIS — J309 Allergic rhinitis, unspecified: Secondary | ICD-10-CM

## 2014-07-24 ENCOUNTER — Ambulatory Visit (INDEPENDENT_AMBULATORY_CARE_PROVIDER_SITE_OTHER): Payer: Managed Care, Other (non HMO)

## 2014-07-24 DIAGNOSIS — J309 Allergic rhinitis, unspecified: Secondary | ICD-10-CM

## 2014-07-30 ENCOUNTER — Telehealth: Payer: Self-pay | Admitting: Internal Medicine

## 2014-07-30 MED ORDER — LEVOCETIRIZINE DIHYDROCHLORIDE 5 MG PO TABS: 2.5000 mg | ORAL_TABLET | Freq: Two times a day (BID) | ORAL | Status: DC

## 2014-07-30 NOTE — Telephone Encounter (Signed)
Pt is asking about a refill for xyzal sent to [facility]. Refill sent and pt is aware. Carron CurieJennifer Macario Shear, CMA

## 2014-07-31 ENCOUNTER — Ambulatory Visit (INDEPENDENT_AMBULATORY_CARE_PROVIDER_SITE_OTHER): Payer: Managed Care, Other (non HMO)

## 2014-07-31 DIAGNOSIS — J309 Allergic rhinitis, unspecified: Secondary | ICD-10-CM

## 2014-08-01 ENCOUNTER — Encounter: Payer: Self-pay | Admitting: Internal Medicine

## 2014-08-07 ENCOUNTER — Ambulatory Visit (INDEPENDENT_AMBULATORY_CARE_PROVIDER_SITE_OTHER): Payer: Managed Care, Other (non HMO)

## 2014-08-07 DIAGNOSIS — J309 Allergic rhinitis, unspecified: Secondary | ICD-10-CM

## 2014-08-14 ENCOUNTER — Ambulatory Visit (INDEPENDENT_AMBULATORY_CARE_PROVIDER_SITE_OTHER): Payer: Managed Care, Other (non HMO)

## 2014-08-14 DIAGNOSIS — J309 Allergic rhinitis, unspecified: Secondary | ICD-10-CM

## 2014-08-21 ENCOUNTER — Ambulatory Visit (INDEPENDENT_AMBULATORY_CARE_PROVIDER_SITE_OTHER): Payer: Managed Care, Other (non HMO)

## 2014-08-21 DIAGNOSIS — J309 Allergic rhinitis, unspecified: Secondary | ICD-10-CM

## 2014-08-28 ENCOUNTER — Ambulatory Visit (INDEPENDENT_AMBULATORY_CARE_PROVIDER_SITE_OTHER): Payer: Managed Care, Other (non HMO)

## 2014-08-28 DIAGNOSIS — J309 Allergic rhinitis, unspecified: Secondary | ICD-10-CM

## 2014-09-04 ENCOUNTER — Ambulatory Visit (INDEPENDENT_AMBULATORY_CARE_PROVIDER_SITE_OTHER): Payer: Managed Care, Other (non HMO)

## 2014-09-04 DIAGNOSIS — J309 Allergic rhinitis, unspecified: Secondary | ICD-10-CM

## 2014-09-05 ENCOUNTER — Encounter: Payer: Self-pay | Admitting: Internal Medicine

## 2014-09-05 ENCOUNTER — Ambulatory Visit: Payer: Managed Care, Other (non HMO) | Admitting: Internal Medicine

## 2014-09-05 VITALS — BP 122/86 | HR 74 | Ht 65.0 in | Wt 222.0 lb

## 2014-09-05 DIAGNOSIS — Z23 Encounter for immunization: Secondary | ICD-10-CM

## 2014-09-05 MED ORDER — MONTELUKAST SODIUM 10 MG PO TABS: 10.0000 mg | ORAL_TABLET | Freq: Every day | ORAL | Status: DC

## 2014-09-05 NOTE — Progress Notes (Signed)
Patient ID: Ralph Ware, male    DOB: [birth year], 58 y.o.   MRN: [id number]  HPI 09/02/11- 37 yoM never smoker followed for OSA, allergic rhinitis, complicated by HBP. Last here March 02, 2011 Noting a little early fall nasal congestion. Had been taking 1/2 tab Xyzal and will go up to a full tab if needed. Rarely uses Astepro.  Allergy vaccine is doing fine. Using Azasite (azithromycin) eyedrop for antibacterial and antiinflammatory effects per his opthalmologist.  CPAP good- all night every night  10/13/11- 55 yoM never smoker followed for OSA, allergic rhinitis, complicated by HBP. Acute visit-shortly after last visit a month ago, laryngitis began as an allergy/cold with increasing thick postnasal drainage turning yellow. Wife had died 2 weeks ago (mixed connective tissue disease, MI) and he has been under a great deal of stress with much need to use his voice to speak. Difficult to get voice rest. Progressive hoarseness. Neti pot. Nose and throat are getting clearer and voices beginning to improve. He has had long-standing concern about his voice quality anyway and would like an ENT evaluation because of chronic raspy voice. He denies aspiration or choking with meals. He discontinued sleeping every night with CPAP, using a humidifier.  09/06/12-  56 yoM never smoker followed for OSA, allergic rhinitis, complicated by HBP. Slight sneezing in past weeks but not much to speak of; great spring this year; still on vaccine 1:10 GH and doing well. Living alone since his wife died. 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 He thinks he sleeps well. Bedtime 11 PM, at 5:30 AM, later on weekends. Admits this may just be not enough sleep.  09/06/13- 57 yoM never smoker followed for OSA, allergic rhinitis, complicated by HBP. FOLLOWS FOR: still on Allergy vaccine 1:10 GH and doing well. He experienced nasal irritation which he  blamed on beach air, resolved on return home from vacation. Continues CPAP 8/[facility] with good control, use every night. No concerns.  09/05/14- 58 yoM never smoker followed for OSA, allergic rhinitis, complicated by HBP. FOLLOWS FOR: Pt c/o runny nose and thick mucus x 10 days. Pt still on  Allergy vaccine 1:10 GH and is doing well with it.  CPAP 8/ [facility]    Review of Systems-see HPI Constitutional:   No-   weight loss, night sweats, fevers, chills, +fatigue, lassitude. HEENT:   No-  headaches, difficulty swallowing, tooth/dental problems, sore throat,       No-  sneezing, itching, ear ache, + nasal congestion, post nasal drip,  CV:  No-   chest pain, orthopnea, PND, swelling in lower extremities, anasarca,  dizziness, palpitations Resp: No-   shortness of breath with exertion or at rest.              No-   productive cough,  No non-productive cough,  No-  coughing up of blood.              No-   change in color of mucus.  No- wheezing.   Skin: No-   rash or lesions. GI:  No-   heartburn, indigestion, abdominal pain, nausea, vomiting, GU:. MS:  No-   joint pain or swelling.  . Neuro- nothing unusual Psych:  No- change in mood or affect. +depression or anxiety.  No memory loss.  Objective:   Physical Exam General- Alert, Oriented, Affect-appropriate, Distress- none acute  Stocky build Skin- rash-none, lesions- none, excoriation-  none Lymphadenopathy- none Head- atraumatic            Eyes- Gross vision intact, PERRLA, conjunctivae clear secretions. Periorbital edema            Ears- Hearing, canals- normal            Nose- Clear with turbinate edema, No-Septal dev, mucus is watery ,No- polyps, erosion, perforation             Throat- Mallampati III-IV , mucosa red , drainage- none, tonsils- atrophic. Hoarse. Neck- flexible , trachea midline, no stridor , thyroid nl, carotid no bruit Chest - symmetrical excursion , unlabored           Heart/CV- RRR , no murmur , no gallop  , no rub,  nl s1 s2                           - JVD- none , edema- none, stasis changes- none, varices- none           Lung- clear to P&A, wheeze- none, cough- none , dullness-none, rub- none           Chest wall-  Abd-  Br/ Gen/ Rectal- Not done, not indicated Extrem- cyanosis- none, clubbing, none, atrophy- none, strength- nl Neuro- grossly intact to observation

## 2014-09-05 NOTE — Patient Instructions (Signed)
We can continue CPAP 8/ [facility]  We can continue Allergy vaccine 1:10 GH  Printed script to try Singulair/ montelukast tabs- 1 daily- to see if it helps your allergic nose  You might try otc nasal spray Nasalcrom/ Cromol/cromolyn for allergic nose.  Flu vax

## 2014-09-14 ENCOUNTER — Ambulatory Visit (INDEPENDENT_AMBULATORY_CARE_PROVIDER_SITE_OTHER): Payer: Managed Care, Other (non HMO)

## 2014-09-14 DIAGNOSIS — J309 Allergic rhinitis, unspecified: Secondary | ICD-10-CM

## 2014-09-18 ENCOUNTER — Ambulatory Visit: Payer: Managed Care, Other (non HMO)

## 2014-09-18 ENCOUNTER — Ambulatory Visit (INDEPENDENT_AMBULATORY_CARE_PROVIDER_SITE_OTHER): Payer: Managed Care, Other (non HMO)

## 2014-09-18 DIAGNOSIS — J309 Allergic rhinitis, unspecified: Secondary | ICD-10-CM

## 2014-09-25 ENCOUNTER — Ambulatory Visit (INDEPENDENT_AMBULATORY_CARE_PROVIDER_SITE_OTHER): Payer: Managed Care, Other (non HMO)

## 2014-09-25 DIAGNOSIS — J309 Allergic rhinitis, unspecified: Secondary | ICD-10-CM

## 2014-09-26 ENCOUNTER — Encounter: Payer: Self-pay | Admitting: Internal Medicine

## 2014-10-02 ENCOUNTER — Ambulatory Visit (INDEPENDENT_AMBULATORY_CARE_PROVIDER_SITE_OTHER): Payer: Managed Care, Other (non HMO)

## 2014-10-02 DIAGNOSIS — J309 Allergic rhinitis, unspecified: Secondary | ICD-10-CM

## 2014-10-09 ENCOUNTER — Ambulatory Visit (INDEPENDENT_AMBULATORY_CARE_PROVIDER_SITE_OTHER): Payer: Managed Care, Other (non HMO)

## 2014-10-09 DIAGNOSIS — J309 Allergic rhinitis, unspecified: Secondary | ICD-10-CM

## 2014-10-16 ENCOUNTER — Ambulatory Visit (INDEPENDENT_AMBULATORY_CARE_PROVIDER_SITE_OTHER): Payer: Managed Care, Other (non HMO)

## 2014-10-16 ENCOUNTER — Telehealth: Payer: Self-pay | Admitting: Internal Medicine

## 2014-10-16 DIAGNOSIS — J309 Allergic rhinitis, unspecified: Secondary | ICD-10-CM

## 2014-10-16 DIAGNOSIS — G4733 Obstructive sleep apnea (adult) (pediatric): Secondary | ICD-10-CM

## 2014-10-16 NOTE — Telephone Encounter (Signed)
Pt is aware that we will place order for new DME for his CPAP needs; I have placed order and also [facility] with the name of the new company.

## 2014-10-18 ENCOUNTER — Ambulatory Visit: Payer: Managed Care, Other (non HMO)

## 2014-10-23 ENCOUNTER — Ambulatory Visit (INDEPENDENT_AMBULATORY_CARE_PROVIDER_SITE_OTHER): Payer: Managed Care, Other (non HMO)

## 2014-10-23 DIAGNOSIS — J309 Allergic rhinitis, unspecified: Secondary | ICD-10-CM

## 2014-10-30 ENCOUNTER — Ambulatory Visit (INDEPENDENT_AMBULATORY_CARE_PROVIDER_SITE_OTHER): Payer: Managed Care, Other (non HMO)

## 2014-10-30 DIAGNOSIS — J309 Allergic rhinitis, unspecified: Secondary | ICD-10-CM

## 2014-11-02 ENCOUNTER — Encounter: Payer: Self-pay | Admitting: Internal Medicine

## 2014-11-06 ENCOUNTER — Ambulatory Visit (INDEPENDENT_AMBULATORY_CARE_PROVIDER_SITE_OTHER): Payer: Managed Care, Other (non HMO)

## 2014-11-06 DIAGNOSIS — J309 Allergic rhinitis, unspecified: Secondary | ICD-10-CM

## 2014-11-13 ENCOUNTER — Ambulatory Visit: Payer: Managed Care, Other (non HMO)

## 2014-11-20 ENCOUNTER — Ambulatory Visit (INDEPENDENT_AMBULATORY_CARE_PROVIDER_SITE_OTHER): Payer: Managed Care, Other (non HMO)

## 2014-11-20 DIAGNOSIS — J309 Allergic rhinitis, unspecified: Secondary | ICD-10-CM

## 2014-11-27 ENCOUNTER — Ambulatory Visit (INDEPENDENT_AMBULATORY_CARE_PROVIDER_SITE_OTHER): Payer: Managed Care, Other (non HMO)

## 2014-11-27 DIAGNOSIS — J309 Allergic rhinitis, unspecified: Secondary | ICD-10-CM

## 2014-11-28 ENCOUNTER — Ambulatory Visit (INDEPENDENT_AMBULATORY_CARE_PROVIDER_SITE_OTHER): Payer: Managed Care, Other (non HMO)

## 2014-11-28 DIAGNOSIS — J309 Allergic rhinitis, unspecified: Secondary | ICD-10-CM

## 2014-12-04 ENCOUNTER — Ambulatory Visit: Payer: Managed Care, Other (non HMO)

## 2014-12-07 ENCOUNTER — Ambulatory Visit (INDEPENDENT_AMBULATORY_CARE_PROVIDER_SITE_OTHER): Payer: Managed Care, Other (non HMO)

## 2014-12-07 DIAGNOSIS — J309 Allergic rhinitis, unspecified: Secondary | ICD-10-CM

## 2014-12-11 ENCOUNTER — Ambulatory Visit (INDEPENDENT_AMBULATORY_CARE_PROVIDER_SITE_OTHER): Payer: Managed Care, Other (non HMO)

## 2014-12-11 DIAGNOSIS — J309 Allergic rhinitis, unspecified: Secondary | ICD-10-CM

## 2014-12-15 ENCOUNTER — Emergency Department (INDEPENDENT_AMBULATORY_CARE_PROVIDER_SITE_OTHER)
Admission: EM | Admit: 2014-12-15 | Discharge: 2014-12-15 | Disposition: A | Discharge status: Home / Self Care | Payer: Managed Care, Other (non HMO) | Source: Home / Self Care | Attending: Family Medicine | Admitting: Family Medicine

## 2014-12-15 ENCOUNTER — Encounter (HOSPITAL_COMMUNITY): Payer: Self-pay | Admitting: Emergency Medicine

## 2014-12-15 DIAGNOSIS — R0982 Postnasal drip: Secondary | ICD-10-CM

## 2014-12-15 DIAGNOSIS — J0100 Acute maxillary sinusitis, unspecified: Secondary | ICD-10-CM

## 2014-12-15 DIAGNOSIS — J069 Acute upper respiratory infection, unspecified: Secondary | ICD-10-CM

## 2014-12-15 MED ORDER — AMOXICILLIN 500 MG PO CAPS: 1000.0000 mg | ORAL_CAPSULE | Freq: Two times a day (BID) | ORAL | Status: DC

## 2014-12-15 NOTE — Discharge Instructions (Signed)
Sinusitis Lots of nasal saline Dayquil and add allegra or claritin Lots of oral fluids Sinusitis is redness, soreness, and inflammation of the paranasal sinuses. Paranasal sinuses are air pockets within the bones of your face (beneath the eyes, the middle of the forehead, or above the eyes). In healthy paranasal sinuses, mucus is able to drain out, and air is able to circulate through them by way of your nose. However, when your paranasal sinuses are inflamed, mucus and air can become trapped. This can allow bacteria and other germs to grow and cause infection. Sinusitis can develop quickly and last only a short time (acute) or continue over a long period (chronic). Sinusitis that lasts for more than 12 weeks is considered chronic.  CAUSES  Causes of sinusitis include:  Allergies.  Structural abnormalities, such as displacement of the cartilage that separates your nostrils (deviated septum), which can decrease the air flow through your nose and sinuses and affect sinus drainage.  Functional abnormalities, such as when the small hairs (cilia) that line your sinuses and help remove mucus do not work properly or are not present. SIGNS AND SYMPTOMS  Symptoms of acute and chronic sinusitis are the same. The primary symptoms are pain and pressure around the affected sinuses. Other symptoms include:  Upper toothache.  Earache.  Headache.  Bad breath.  Decreased sense of smell and taste.  A cough, which worsens when you are lying flat.  Fatigue.  Fever.  Thick drainage from your nose, which often is green and may contain pus (purulent).  Swelling and warmth over the affected sinuses. DIAGNOSIS  Your health care provider will perform a physical exam. During the exam, your health care provider may:  Look in your nose for signs of abnormal growths in your nostrils (nasal polyps).  Tap over the affected sinus to check for signs of infection.  View the inside of your sinuses (endoscopy)  using an imaging device that has a light attached (endoscope). If your health care provider suspects that you have chronic sinusitis, one or more of the following tests may be recommended:  Allergy tests.  Nasal culture. A sample of mucus is taken from your nose, sent to a lab, and screened for bacteria.  Nasal cytology. A sample of mucus is taken from your nose and examined by your health care provider to determine if your sinusitis is related to an allergy. TREATMENT  Most cases of acute sinusitis are related to a viral infection and will resolve on their own within 10 days. Sometimes medicines are prescribed to help relieve symptoms (pain medicine, decongestants, nasal steroid sprays, or saline sprays).  However, for sinusitis related to a bacterial infection, your health care provider will prescribe antibiotic medicines. These are medicines that will help kill the bacteria causing the infection.  Rarely, sinusitis is caused by a fungal infection. In theses cases, your health care provider will prescribe antifungal medicine. For some cases of chronic sinusitis, surgery is needed. Generally, these are cases in which sinusitis recurs more than 3 times per year, despite other treatments. HOME CARE INSTRUCTIONS   Drink plenty of water. Water helps thin the mucus so your sinuses can drain more easily.  Use a humidifier.  Inhale steam 3 to 4 times a day (for example, sit in the bathroom with the shower running).  Apply a warm, moist washcloth to your face 3 to 4 times a day, or as directed by your health care provider.  Use saline nasal sprays to help moisten and clean  your sinuses.  Take medicines only as directed by your health care provider.  If you were prescribed either an antibiotic or antifungal medicine, finish it all even if you start to feel better. SEEK IMMEDIATE MEDICAL CARE IF:  You have increasing pain or severe headaches.   Upper Respiratory Infection, Adult An upper  respiratory infection (URI) is also sometimes known as the common cold. The upper respiratory tract includes the nose, sinuses, throat, trachea, and bronchi. Bronchi are the airways leading to the lungs. Most people improve within 1 week, but symptoms can last up to 2 weeks. A residual cough may last even longer.  CAUSES Many different viruses can infect the tissues lining the upper respiratory tract. The tissues become irritated and inflamed and often become very moist. Mucus production is also common. A cold is contagious. You can easily spread the virus to others by oral contact. This includes kissing, sharing a glass, coughing, or sneezing. Touching your mouth or nose and then touching a surface, which is then touched by another person, can also spread the virus. SYMPTOMS  Symptoms typically develop 1 to 3 days after you come in contact with a cold virus. Symptoms vary from person to person. They may include: Runny nose. Sneezing. Nasal congestion. Sinus irritation. Sore throat. Loss of voice (laryngitis). Cough. Fatigue. Muscle aches. Loss of appetite. Headache. Low-grade fever. DIAGNOSIS  You might diagnose your own cold based on familiar symptoms, since most people get a cold 2 to 3 times a year. Your caregiver can confirm this based on your exam. Most importantly, your caregiver can check that your symptoms are not due to another disease such as strep throat, sinusitis, pneumonia, asthma, or epiglottitis. Blood tests, throat tests, and X-rays are not necessary to diagnose a common cold, but they may sometimes be helpful in excluding other more serious diseases. Your caregiver will decide if any further tests are required. RISKS AND COMPLICATIONS  You may be at risk for a more severe case of the common cold if you smoke cigarettes, have chronic heart disease (such as heart failure) or lung disease (such as asthma), or if you have a weakened immune system. The very young and very old are  also at risk for more serious infections. Bacterial sinusitis, middle ear infections, and bacterial pneumonia can complicate the common cold. The common cold can worsen asthma and chronic obstructive pulmonary disease (COPD). Sometimes, these complications can require emergency medical care and may be life-threatening. PREVENTION  The best way to protect against getting a cold is to practice good hygiene. Avoid oral or hand contact with people with cold symptoms. Wash your hands often if contact occurs. There is no clear evidence that vitamin C, vitamin E, echinacea, or exercise reduces the chance of developing a cold. However, it is always recommended to get plenty of rest and practice good nutrition. TREATMENT  Treatment is directed at relieving symptoms. There is no cure. Antibiotics are not effective, because the infection is caused by a virus, not by bacteria. Treatment may include: Increased fluid intake. Sports drinks offer valuable electrolytes, sugars, and fluids. Breathing heated mist or steam (vaporizer or shower). Eating chicken soup or other clear broths, and maintaining good nutrition. Getting plenty of rest. Using gargles or lozenges for comfort. Controlling fevers with ibuprofen or acetaminophen as directed by your caregiver. Increasing usage of your inhaler if you have asthma. Zinc gel and zinc lozenges, taken in the first 24 hours of the common cold, can shorten the duration and  lessen the severity of symptoms. Pain medicines may help with fever, muscle aches, and throat pain. A variety of non-prescription medicines are available to treat congestion and runny nose. Your caregiver can make recommendations and may suggest nasal or lung inhalers for other symptoms.  HOME CARE INSTRUCTIONS  Only take over-the-counter or prescription medicines for pain, discomfort, or fever as directed by your caregiver. Use a warm mist humidifier or inhale steam from a shower to increase air moisture.  This may keep secretions moist and make it easier to breathe. Drink enough water and fluids to keep your urine clear or pale yellow. Rest as needed. Return to work when your temperature has returned to normal or as your caregiver advises. You may need to stay home longer to avoid infecting others. You can also use a face mask and careful hand washing to prevent spread of the virus. SEEK MEDICAL CARE IF:  After the first few days, you feel you are getting worse rather than better. You need your caregiver's advice about medicines to control symptoms. You develop chills, worsening shortness of breath, or brown or red sputum. These may be signs of pneumonia. You develop yellow or brown nasal discharge or pain in the face, especially when you bend forward. These may be signs of sinusitis. You develop a fever, swollen neck glands, pain with swallowing, or white areas in the back of your throat. These may be signs of strep throat. SEEK IMMEDIATE MEDICAL CARE IF:  You have a fever. You develop severe or persistent headache, ear pain, sinus pain, or chest pain. You develop wheezing, a prolonged cough, cough up blood, or have a change in your usual mucus (if you have chronic lung disease). You develop sore muscles or a stiff neck. Document Released: 06/02/2001 Document Revised: 02/29/2012 Document Reviewed: 03/14/2014 [facility] Patient Information 2015 [place], [place]. This information is not intended to replace advice given to you by your health care provider. Make sure you discuss any questions you have with your health care provider.  You have nausea, vomiting, or drowsiness.  You have swelling around your face.  You have vision problems.  You have a stiff neck.  You have difficulty breathing. MAKE SURE YOU:   Understand these instructions.  Will watch your condition.  Will get help right away if you are not doing well or get worse. Document Released: 12/07/2005 Document Revised: 04/23/2014  Document Reviewed: 12/22/2011 [facility] Patient Information 2015 [place], [place]. This information is not intended to replace advice given to you by your health care provider. Make sure you discuss any questions you have with your health care provider.

## 2014-12-15 NOTE — ED Provider Notes (Signed)
CSN: [id number]     Arrival date & time 12/15/14  1750 History   First MD Initiated Contact with Patient 12/15/14 1852     Chief Complaint  Patient presents with  . Facial Pain   (Consider location/radiation/quality/duration/timing/severity/associated sxs/prior Treatment) HPI Comments: Cold sx's for 4 d   Past Medical History  Diagnosis Date  . Allergic rhinitis   . Hyperlipidemia   . Hypertension   . OSA (obstructive sleep apnea)    Past Surgical History  Procedure Laterality Date  . Nasal septum surgery      x2   No family history on file. History  Substance Use Topics  . Smoking status: Never Smoker   . Smokeless tobacco: Not on file  . Alcohol Use: Not on file    Review of Systems  Constitutional: Positive for activity change. Negative for fever, chills, diaphoresis and fatigue.  HENT: Positive for congestion, postnasal drip, rhinorrhea, sinus pressure, sore throat and trouble swallowing. Negative for ear pain and facial swelling.   Eyes: Negative for pain, discharge and redness.  Respiratory: Positive for cough. Negative for chest tightness, shortness of breath and wheezing.   Cardiovascular: Negative.   Gastrointestinal: Negative.   Musculoskeletal: Negative.  Negative for neck pain and neck stiffness.  Neurological: Negative.     Allergies  Review of patient's allergies indicates no known allergies.  Home Medications   Prior to Admission medications   Medication Sig Start Date End Date Taking? Authorizing Provider  amoxicillin (AMOXIL) 500 MG capsule Take 2 capsules (1,000 mg total) by mouth 2 (two) times daily. 12/15/14   Hayden Rasmussenavid Shabree Tebbetts, NP  Ascorbic Acid (VITAMIN C) 1000 MG tablet Take 1,000 mg by mouth daily.      Historical Provider, MD  aspirin 81 MG tablet Take 81 mg by mouth daily.      Historical Provider, MD  AZASITE 1 % ophthalmic solution  06/09/11   Historical Provider, MD  Azelastine HCl (ASTEPRO) 0.15 % SOLN Place 2 puffs into the nose 2 (two)  times daily as needed.     Historical Provider, MD  Cholecalciferol (VITAMIN D) 1000 UNITS capsule Take 1,000 Units by mouth daily.      Historical Provider, MD  Coenzyme Q10 (COQ10) 100 MG CAPS Take 1 capsule by mouth daily.      Historical Provider, MD  diphenhydrAMINE (BENADRYL) 25 MG tablet Take 25 mg by mouth at bedtime as needed.      Historical Provider, MD  levocetirizine (XYZAL) 5 MG tablet Take 0.5 tablets (2.5 mg total) by mouth 2 (two) times daily. 07/30/14 07/30/15  Waymon Budgelinton D Young, MD  losartan-hydrochlorothiazide (HYZAAR) 100-12.5 MG per tablet Take 1 tablet by mouth daily.      Historical Provider, MD  Magnesium 250 MG TABS Take 1 tablet by mouth daily.      Historical Provider, MD  montelukast (SINGULAIR) 10 MG tablet Take 1 tablet (10 mg total) by mouth at bedtime. 09/05/14   Waymon Budgelinton D Young, MD  simvastatin (ZOCOR) 40 MG tablet Take 1 tablet by mouth daily.    Historical Provider, MD  zinc gluconate 50 MG tablet Take 50 mg by mouth daily.      Historical Provider, MD   BP 150/97 mmHg  Pulse 96  Temp(Src) 100 F (37.8 C) (Oral)  Resp 16  SpO2 100% Physical Exam  Constitutional: He is oriented to person, place, and time. He appears well-developed and well-nourished. No distress.  HENT:  Mouth/Throat: No oropharyngeal exudate.  Bilateral TMs are  normal Oropharynx with minor erythema and without exudates.  Eyes: Conjunctivae and EOM are normal.  Neck: Normal range of motion. Neck supple.  Cardiovascular: Normal rate, regular rhythm and normal heart sounds.   Pulmonary/Chest: Effort normal and breath sounds normal. No respiratory distress. He has no wheezes. He has no rales.  Musculoskeletal: Normal range of motion. He exhibits no edema.  Lymphadenopathy:    He has no cervical adenopathy.  Neurological: He is alert and oriented to person, place, and time.  Skin: Skin is warm and dry. No rash noted.  Psychiatric: He has a normal mood and affect.  Nursing note and vitals  reviewed.   ED Course  Procedures (including critical care time) Labs Review Labs Reviewed - No data to display  Imaging Review No results found.   MDM   1. URI (upper respiratory infection)   2. Acute maxillary sinusitis, recurrence not specified   3. PND (post-nasal drip)    Amoxil Lots of nasal saline Dayquil and add allegra or claritin Lots of oral fluids     Hayden Rasmussenavid Lacosta Hargan, NP 12/15/14 1904

## 2014-12-15 NOTE — ED Notes (Signed)
Cough and sinus congestion.  Onset Tuesday of symptoms. Fever reported here today.  Cough, coughing up yellow phlegm.  Reports mild sore throat, no ear pain, and does have a headache

## 2014-12-18 ENCOUNTER — Ambulatory Visit: Payer: Managed Care, Other (non HMO)

## 2014-12-25 ENCOUNTER — Ambulatory Visit (INDEPENDENT_AMBULATORY_CARE_PROVIDER_SITE_OTHER): Payer: Managed Care, Other (non HMO)

## 2014-12-25 DIAGNOSIS — J309 Allergic rhinitis, unspecified: Secondary | ICD-10-CM

## 2014-12-26 ENCOUNTER — Encounter: Payer: Self-pay | Admitting: Internal Medicine

## 2015-01-01 ENCOUNTER — Ambulatory Visit (INDEPENDENT_AMBULATORY_CARE_PROVIDER_SITE_OTHER): Payer: Managed Care, Other (non HMO)

## 2015-01-01 DIAGNOSIS — J309 Allergic rhinitis, unspecified: Secondary | ICD-10-CM

## 2015-01-08 ENCOUNTER — Ambulatory Visit (INDEPENDENT_AMBULATORY_CARE_PROVIDER_SITE_OTHER): Payer: Managed Care, Other (non HMO)

## 2015-01-08 DIAGNOSIS — J309 Allergic rhinitis, unspecified: Secondary | ICD-10-CM

## 2015-01-15 ENCOUNTER — Ambulatory Visit (INDEPENDENT_AMBULATORY_CARE_PROVIDER_SITE_OTHER): Payer: Managed Care, Other (non HMO)

## 2015-01-15 DIAGNOSIS — J309 Allergic rhinitis, unspecified: Secondary | ICD-10-CM

## 2015-01-22 ENCOUNTER — Ambulatory Visit (INDEPENDENT_AMBULATORY_CARE_PROVIDER_SITE_OTHER): Payer: Managed Care, Other (non HMO)

## 2015-01-22 DIAGNOSIS — J309 Allergic rhinitis, unspecified: Secondary | ICD-10-CM

## 2015-01-29 ENCOUNTER — Ambulatory Visit (INDEPENDENT_AMBULATORY_CARE_PROVIDER_SITE_OTHER): Payer: Managed Care, Other (non HMO)

## 2015-01-29 DIAGNOSIS — J309 Allergic rhinitis, unspecified: Secondary | ICD-10-CM

## 2015-02-05 ENCOUNTER — Ambulatory Visit (INDEPENDENT_AMBULATORY_CARE_PROVIDER_SITE_OTHER): Payer: Managed Care, Other (non HMO)

## 2015-02-05 DIAGNOSIS — J309 Allergic rhinitis, unspecified: Secondary | ICD-10-CM

## 2015-02-12 ENCOUNTER — Ambulatory Visit (INDEPENDENT_AMBULATORY_CARE_PROVIDER_SITE_OTHER): Payer: Managed Care, Other (non HMO)

## 2015-02-12 DIAGNOSIS — J309 Allergic rhinitis, unspecified: Secondary | ICD-10-CM

## 2015-02-19 ENCOUNTER — Ambulatory Visit (INDEPENDENT_AMBULATORY_CARE_PROVIDER_SITE_OTHER): Payer: Managed Care, Other (non HMO)

## 2015-02-19 DIAGNOSIS — J309 Allergic rhinitis, unspecified: Secondary | ICD-10-CM

## 2015-02-26 ENCOUNTER — Ambulatory Visit (INDEPENDENT_AMBULATORY_CARE_PROVIDER_SITE_OTHER): Payer: Managed Care, Other (non HMO)

## 2015-02-26 DIAGNOSIS — J309 Allergic rhinitis, unspecified: Secondary | ICD-10-CM

## 2015-03-05 ENCOUNTER — Ambulatory Visit: Payer: Managed Care, Other (non HMO)

## 2015-03-07 ENCOUNTER — Ambulatory Visit (INDEPENDENT_AMBULATORY_CARE_PROVIDER_SITE_OTHER): Payer: Managed Care, Other (non HMO)

## 2015-03-07 DIAGNOSIS — J309 Allergic rhinitis, unspecified: Secondary | ICD-10-CM

## 2015-03-12 ENCOUNTER — Ambulatory Visit (INDEPENDENT_AMBULATORY_CARE_PROVIDER_SITE_OTHER): Payer: Managed Care, Other (non HMO)

## 2015-03-12 DIAGNOSIS — J309 Allergic rhinitis, unspecified: Secondary | ICD-10-CM | POA: Diagnosis not present

## 2015-03-19 ENCOUNTER — Ambulatory Visit (INDEPENDENT_AMBULATORY_CARE_PROVIDER_SITE_OTHER): Payer: Managed Care, Other (non HMO)

## 2015-03-19 DIAGNOSIS — J309 Allergic rhinitis, unspecified: Secondary | ICD-10-CM

## 2015-03-21 ENCOUNTER — Ambulatory Visit (INDEPENDENT_AMBULATORY_CARE_PROVIDER_SITE_OTHER): Payer: Managed Care, Other (non HMO)

## 2015-03-21 DIAGNOSIS — J309 Allergic rhinitis, unspecified: Secondary | ICD-10-CM | POA: Diagnosis not present

## 2015-03-26 ENCOUNTER — Ambulatory Visit (INDEPENDENT_AMBULATORY_CARE_PROVIDER_SITE_OTHER): Payer: Managed Care, Other (non HMO)

## 2015-03-26 DIAGNOSIS — J309 Allergic rhinitis, unspecified: Secondary | ICD-10-CM

## 2015-04-02 ENCOUNTER — Ambulatory Visit (INDEPENDENT_AMBULATORY_CARE_PROVIDER_SITE_OTHER): Payer: Managed Care, Other (non HMO)

## 2015-04-02 DIAGNOSIS — J309 Allergic rhinitis, unspecified: Secondary | ICD-10-CM | POA: Diagnosis not present

## 2015-04-09 ENCOUNTER — Ambulatory Visit: Payer: Managed Care, Other (non HMO)

## 2015-04-11 ENCOUNTER — Ambulatory Visit (INDEPENDENT_AMBULATORY_CARE_PROVIDER_SITE_OTHER): Payer: Managed Care, Other (non HMO)

## 2015-04-11 DIAGNOSIS — J309 Allergic rhinitis, unspecified: Secondary | ICD-10-CM

## 2015-04-15 ENCOUNTER — Encounter: Payer: Self-pay | Admitting: Internal Medicine

## 2015-04-16 ENCOUNTER — Ambulatory Visit (INDEPENDENT_AMBULATORY_CARE_PROVIDER_SITE_OTHER): Payer: Managed Care, Other (non HMO)

## 2015-04-16 DIAGNOSIS — J309 Allergic rhinitis, unspecified: Secondary | ICD-10-CM

## 2015-04-23 ENCOUNTER — Ambulatory Visit (INDEPENDENT_AMBULATORY_CARE_PROVIDER_SITE_OTHER): Payer: Managed Care, Other (non HMO)

## 2015-04-23 DIAGNOSIS — J309 Allergic rhinitis, unspecified: Secondary | ICD-10-CM

## 2015-04-30 ENCOUNTER — Ambulatory Visit (INDEPENDENT_AMBULATORY_CARE_PROVIDER_SITE_OTHER): Payer: Managed Care, Other (non HMO)

## 2015-04-30 DIAGNOSIS — J309 Allergic rhinitis, unspecified: Secondary | ICD-10-CM

## 2015-05-07 ENCOUNTER — Ambulatory Visit (INDEPENDENT_AMBULATORY_CARE_PROVIDER_SITE_OTHER): Payer: Managed Care, Other (non HMO)

## 2015-05-07 DIAGNOSIS — J309 Allergic rhinitis, unspecified: Secondary | ICD-10-CM

## 2015-05-14 ENCOUNTER — Ambulatory Visit (INDEPENDENT_AMBULATORY_CARE_PROVIDER_SITE_OTHER): Payer: Managed Care, Other (non HMO)

## 2015-05-14 DIAGNOSIS — J309 Allergic rhinitis, unspecified: Secondary | ICD-10-CM

## 2015-05-21 ENCOUNTER — Ambulatory Visit (INDEPENDENT_AMBULATORY_CARE_PROVIDER_SITE_OTHER): Payer: Managed Care, Other (non HMO)

## 2015-05-21 DIAGNOSIS — J309 Allergic rhinitis, unspecified: Secondary | ICD-10-CM

## 2015-05-28 ENCOUNTER — Ambulatory Visit (INDEPENDENT_AMBULATORY_CARE_PROVIDER_SITE_OTHER): Payer: Managed Care, Other (non HMO)

## 2015-05-28 DIAGNOSIS — J309 Allergic rhinitis, unspecified: Secondary | ICD-10-CM

## 2015-06-04 ENCOUNTER — Ambulatory Visit (INDEPENDENT_AMBULATORY_CARE_PROVIDER_SITE_OTHER): Payer: Managed Care, Other (non HMO)

## 2015-06-04 DIAGNOSIS — J309 Allergic rhinitis, unspecified: Secondary | ICD-10-CM

## 2015-06-10 ENCOUNTER — Ambulatory Visit (INDEPENDENT_AMBULATORY_CARE_PROVIDER_SITE_OTHER): Payer: Managed Care, Other (non HMO)

## 2015-06-10 DIAGNOSIS — J309 Allergic rhinitis, unspecified: Secondary | ICD-10-CM | POA: Diagnosis not present

## 2015-06-11 ENCOUNTER — Ambulatory Visit: Payer: Managed Care, Other (non HMO)

## 2015-06-18 ENCOUNTER — Ambulatory Visit: Payer: Managed Care, Other (non HMO)

## 2015-06-21 ENCOUNTER — Ambulatory Visit (INDEPENDENT_AMBULATORY_CARE_PROVIDER_SITE_OTHER): Payer: Managed Care, Other (non HMO)

## 2015-06-21 DIAGNOSIS — J309 Allergic rhinitis, unspecified: Secondary | ICD-10-CM

## 2015-06-25 ENCOUNTER — Ambulatory Visit (INDEPENDENT_AMBULATORY_CARE_PROVIDER_SITE_OTHER): Payer: Managed Care, Other (non HMO)

## 2015-06-25 DIAGNOSIS — J309 Allergic rhinitis, unspecified: Secondary | ICD-10-CM | POA: Diagnosis not present

## 2015-06-28 ENCOUNTER — Ambulatory Visit (INDEPENDENT_AMBULATORY_CARE_PROVIDER_SITE_OTHER): Payer: Managed Care, Other (non HMO)

## 2015-06-28 ENCOUNTER — Telehealth: Payer: Self-pay | Admitting: Internal Medicine

## 2015-06-28 DIAGNOSIS — J309 Allergic rhinitis, unspecified: Secondary | ICD-10-CM

## 2015-06-28 NOTE — Telephone Encounter (Signed)
Date Mixed:  06/28/2015 Vial: A Strength: 1:10 Here/Mail/Pick Up: Here Mixed By: Jaynee EaglesLindsay Darolyn Double, CMA

## 2015-07-01 ENCOUNTER — Encounter: Payer: Self-pay | Admitting: Internal Medicine

## 2015-07-01 ENCOUNTER — Ambulatory Visit (INDEPENDENT_AMBULATORY_CARE_PROVIDER_SITE_OTHER): Payer: Managed Care, Other (non HMO)

## 2015-07-01 DIAGNOSIS — J309 Allergic rhinitis, unspecified: Secondary | ICD-10-CM

## 2015-07-02 ENCOUNTER — Ambulatory Visit: Payer: Managed Care, Other (non HMO)

## 2015-07-09 ENCOUNTER — Ambulatory Visit (INDEPENDENT_AMBULATORY_CARE_PROVIDER_SITE_OTHER): Payer: Managed Care, Other (non HMO)

## 2015-07-09 DIAGNOSIS — J309 Allergic rhinitis, unspecified: Secondary | ICD-10-CM | POA: Diagnosis not present

## 2015-07-16 ENCOUNTER — Ambulatory Visit (INDEPENDENT_AMBULATORY_CARE_PROVIDER_SITE_OTHER): Payer: Managed Care, Other (non HMO)

## 2015-07-16 DIAGNOSIS — J309 Allergic rhinitis, unspecified: Secondary | ICD-10-CM

## 2015-07-22 ENCOUNTER — Ambulatory Visit (INDEPENDENT_AMBULATORY_CARE_PROVIDER_SITE_OTHER): Payer: Managed Care, Other (non HMO)

## 2015-07-22 DIAGNOSIS — J309 Allergic rhinitis, unspecified: Secondary | ICD-10-CM

## 2015-08-06 ENCOUNTER — Ambulatory Visit (INDEPENDENT_AMBULATORY_CARE_PROVIDER_SITE_OTHER): Payer: Managed Care, Other (non HMO)

## 2015-08-06 DIAGNOSIS — J309 Allergic rhinitis, unspecified: Secondary | ICD-10-CM | POA: Diagnosis not present

## 2015-08-13 ENCOUNTER — Ambulatory Visit (INDEPENDENT_AMBULATORY_CARE_PROVIDER_SITE_OTHER): Payer: Managed Care, Other (non HMO)

## 2015-08-13 DIAGNOSIS — J309 Allergic rhinitis, unspecified: Secondary | ICD-10-CM | POA: Diagnosis not present

## 2015-08-20 ENCOUNTER — Ambulatory Visit (INDEPENDENT_AMBULATORY_CARE_PROVIDER_SITE_OTHER): Payer: Managed Care, Other (non HMO)

## 2015-08-20 DIAGNOSIS — J309 Allergic rhinitis, unspecified: Secondary | ICD-10-CM

## 2015-08-27 ENCOUNTER — Ambulatory Visit (INDEPENDENT_AMBULATORY_CARE_PROVIDER_SITE_OTHER): Payer: Managed Care, Other (non HMO)

## 2015-08-27 DIAGNOSIS — J309 Allergic rhinitis, unspecified: Secondary | ICD-10-CM

## 2015-08-30 ENCOUNTER — Other Ambulatory Visit: Payer: Self-pay | Admitting: Internal Medicine

## 2015-09-03 ENCOUNTER — Ambulatory Visit (INDEPENDENT_AMBULATORY_CARE_PROVIDER_SITE_OTHER): Payer: Managed Care, Other (non HMO)

## 2015-09-03 DIAGNOSIS — J309 Allergic rhinitis, unspecified: Secondary | ICD-10-CM

## 2015-09-04 ENCOUNTER — Encounter: Payer: Self-pay | Admitting: Internal Medicine

## 2015-09-10 ENCOUNTER — Ambulatory Visit (INDEPENDENT_AMBULATORY_CARE_PROVIDER_SITE_OTHER): Payer: Managed Care, Other (non HMO)

## 2015-09-10 DIAGNOSIS — J309 Allergic rhinitis, unspecified: Secondary | ICD-10-CM

## 2015-09-11 ENCOUNTER — Ambulatory Visit (INDEPENDENT_AMBULATORY_CARE_PROVIDER_SITE_OTHER): Payer: Managed Care, Other (non HMO) | Admitting: Internal Medicine

## 2015-09-11 ENCOUNTER — Encounter: Payer: Self-pay | Admitting: Internal Medicine

## 2015-09-11 VITALS — BP 120/84 | HR 69 | Ht 65.0 in | Wt 228.6 lb

## 2015-09-11 DIAGNOSIS — G4733 Obstructive sleep apnea (adult) (pediatric): Secondary | ICD-10-CM

## 2015-09-11 DIAGNOSIS — J301 Allergic rhinitis due to pollen: Secondary | ICD-10-CM

## 2015-09-11 MED ORDER — LEVOCETIRIZINE DIHYDROCHLORIDE 5 MG PO TABS: ORAL_TABLET | ORAL | Status: DC

## 2015-09-11 MED ORDER — AZELASTINE HCL 0.15 % NA SOLN: 2.0000 | Freq: Two times a day (BID) | NASAL | Status: DC | PRN

## 2015-09-11 MED ORDER — LEVOCETIRIZINE DIHYDROCHLORIDE 5 MG PO TABS: ORAL_TABLET | ORAL | Status: AC

## 2015-09-11 NOTE — Patient Instructions (Signed)
Script for azelastine nasal spray sent to [facility] for Xyzal sent to [facility]  We can continue allergy vaccine 1:10 GH  We can continue CPAP 8/ [facility]  Don't forget that flu shot !

## 2015-09-11 NOTE — Progress Notes (Signed)
Patient ID: Ralph Ware, male    DOB: 08/07/1956, 59 y.o.   MRN: [id number]  HPI 09/02/11- 49 yoM never smoker followed for OSA, allergic rhinitis, complicated by HBP. Last here March 02, 2011 Noting a little early fall nasal congestion. Had been taking 1/2 tab Xyzal and will go up to a full tab if needed. Rarely uses Astepro.  Allergy vaccine is doing fine. Using Azasite (azithromycin) eyedrop for antibacterial and antiinflammatory effects per his opthalmologist.  CPAP good- all night every night  10/13/11- 55 yoM never smoker followed for OSA, allergic rhinitis, complicated by HBP. Acute visit-shortly after last visit a month ago, laryngitis began as an allergy/cold with increasing thick postnasal drainage turning yellow. Wife had died 2 weeks ago (mixed connective tissue disease, MI) and he has been under a great deal of stress with much need to use his voice to speak. Difficult to get voice rest. Progressive hoarseness. Neti pot. Nose and throat are getting clearer and voices beginning to improve. He has had long-standing concern about his voice quality anyway and would like an ENT evaluation because of chronic raspy voice. He denies aspiration or choking with meals. He discontinued sleeping every night with CPAP, using a humidifier.  09/06/12-  56 yoM never smoker followed for OSA, allergic rhinitis, complicated by HBP. Slight sneezing in past weeks but not much to speak of; great spring this year; still on vaccine 1:10 GH and doing well. Living alone since his wife died. 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 He thinks he sleeps well. Bedtime 11 PM, at 5:30 AM, later on weekends. Admits this may just be not enough sleep.  09/06/13- 57 yoM never smoker followed for OSA, allergic rhinitis, complicated by HBP. FOLLOWS FOR: still on Allergy vaccine 1:10 GH and doing well. He experienced nasal irritation which he  blamed on beach air, resolved on return home from vacation. Continues CPAP 8/[facility] with good control, use every night. No concerns.  09/05/14- 58 yoM never smoker followed for OSA, allergic rhinitis, complicated by HBP. FOLLOWS FOR: Pt c/o runny nose and thick mucus x 10 days. Pt still on  Allergy vaccine 1:10 GH and is doing well with it.  CPAP 8/ [facility]  09/11/15- 58 yoM never smoker followed for OSA, allergic rhinitis, complicated by HBP Allergy vaccine 1:10 GH.   CPAP 8/ [facility] We tried adding singulair, nasalcrom at last ov FOLLOWS FOR: pt states he is doing well. pt using CPAP everynight for about 6 hours. pressure and mask good for pt. pt still recieving allergy vaccine every week and working well for him. no concerns at this time. Likes Astepro nasal spray. He didn't try either Singulair or Nasalcrom after last visit.  Review of Systems-see HPI Constitutional:   No-   weight loss, night sweats, fevers, chills, +fatigue, lassitude. HEENT:   No-  headaches, difficulty swallowing, tooth/dental problems, sore throat,       No-  sneezing, itching, ear ache, + nasal congestion, post nasal drip,  CV:  No-   chest pain, orthopnea, PND, swelling in lower extremities, anasarca,  dizziness, palpitations Resp: No-   shortness of breath with exertion or at rest.              No-   productive cough,  No non-productive cough,  No-  coughing up of blood.  No-   change in color of mucus.  No- wheezing.   Skin: No-   rash or lesions. GI:  No-   heartburn, indigestion, abdominal pain, nausea, vomiting, GU:. MS:  No-   joint pain or swelling.  . Neuro- nothing unusual Psych:  No- change in mood or affect. +depression or anxiety.  No memory loss.  Objective:   Physical Exam General- Alert, Oriented, Affect-appropriate, Distress- none acute  Stocky build Skin- rash-none, lesions- none, excoriation- none Lymphadenopathy- none Head- atraumatic            Eyes- Gross vision intact,  PERRLA, conjunctivae clear secretions. Periorbital edema            Ears- Hearing, canals- normal            Nose- Clear with turbinate edema, No-Septal dev, mucus is watery ,No- polyps, erosion, perforation             Throat- Mallampati III-IV , mucosa red , drainage- none, tonsils- atrophic. Hoarse. Neck- flexible , trachea midline, no stridor , thyroid nl, carotid no bruit Chest - symmetrical excursion , unlabored           Heart/CV- RRR , no murmur , no gallop  , no rub, nl s1 s2                           - JVD- none , edema- none, stasis changes- none, varices- none           Lung- clear to P&A, wheeze- none, cough- none , dullness-none, rub- none           Chest wall-  Abd-  Br/ Gen/ Rectal- Not done, not indicated Extrem- cyanosis- none, clubbing, none, atrophy- none, strength- nl Neuro- grossly intact to observation

## 2015-09-15 NOTE — Assessment & Plan Note (Signed)
Doing quite well with CPAP. He uses it all night every night and depends on it.

## 2015-09-15 NOTE — Assessment & Plan Note (Signed)
Plan-refill Astepro, continue allergy vaccine, okay to use antihistamine when needed

## 2015-09-17 ENCOUNTER — Ambulatory Visit: Payer: Managed Care, Other (non HMO)

## 2015-09-19 ENCOUNTER — Ambulatory Visit (INDEPENDENT_AMBULATORY_CARE_PROVIDER_SITE_OTHER): Payer: Managed Care, Other (non HMO)

## 2015-09-19 DIAGNOSIS — J309 Allergic rhinitis, unspecified: Secondary | ICD-10-CM

## 2015-09-24 ENCOUNTER — Ambulatory Visit (INDEPENDENT_AMBULATORY_CARE_PROVIDER_SITE_OTHER): Payer: Managed Care, Other (non HMO)

## 2015-09-24 DIAGNOSIS — J309 Allergic rhinitis, unspecified: Secondary | ICD-10-CM | POA: Diagnosis not present

## 2015-10-01 ENCOUNTER — Ambulatory Visit (INDEPENDENT_AMBULATORY_CARE_PROVIDER_SITE_OTHER): Payer: Managed Care, Other (non HMO)

## 2015-10-01 DIAGNOSIS — J309 Allergic rhinitis, unspecified: Secondary | ICD-10-CM

## 2015-10-08 ENCOUNTER — Ambulatory Visit (INDEPENDENT_AMBULATORY_CARE_PROVIDER_SITE_OTHER): Payer: Managed Care, Other (non HMO)

## 2015-10-08 DIAGNOSIS — J309 Allergic rhinitis, unspecified: Secondary | ICD-10-CM | POA: Diagnosis not present

## 2015-10-15 ENCOUNTER — Ambulatory Visit: Payer: Managed Care, Other (non HMO)

## 2015-10-18 ENCOUNTER — Ambulatory Visit (INDEPENDENT_AMBULATORY_CARE_PROVIDER_SITE_OTHER): Payer: Managed Care, Other (non HMO)

## 2015-10-18 DIAGNOSIS — J309 Allergic rhinitis, unspecified: Secondary | ICD-10-CM

## 2015-10-21 ENCOUNTER — Telehealth: Payer: Self-pay | Admitting: Internal Medicine

## 2015-10-21 ENCOUNTER — Ambulatory Visit (INDEPENDENT_AMBULATORY_CARE_PROVIDER_SITE_OTHER): Payer: Managed Care, Other (non HMO)

## 2015-10-21 DIAGNOSIS — J309 Allergic rhinitis, unspecified: Secondary | ICD-10-CM | POA: Diagnosis not present

## 2015-10-21 NOTE — Telephone Encounter (Signed)
Allergy Serum Extract Date Mixed: 10/21/15 Vial: 1 Strength: 1:10 Here/Mail/Pick Up: here Mixed By: tbs

## 2015-10-22 ENCOUNTER — Ambulatory Visit (INDEPENDENT_AMBULATORY_CARE_PROVIDER_SITE_OTHER): Payer: Managed Care, Other (non HMO)

## 2015-10-22 DIAGNOSIS — J309 Allergic rhinitis, unspecified: Secondary | ICD-10-CM

## 2015-10-29 ENCOUNTER — Ambulatory Visit (INDEPENDENT_AMBULATORY_CARE_PROVIDER_SITE_OTHER): Payer: Managed Care, Other (non HMO)

## 2015-10-29 DIAGNOSIS — J309 Allergic rhinitis, unspecified: Secondary | ICD-10-CM | POA: Diagnosis not present

## 2015-11-05 ENCOUNTER — Ambulatory Visit (INDEPENDENT_AMBULATORY_CARE_PROVIDER_SITE_OTHER): Payer: Managed Care, Other (non HMO)

## 2015-11-05 DIAGNOSIS — J309 Allergic rhinitis, unspecified: Secondary | ICD-10-CM

## 2015-11-11 ENCOUNTER — Ambulatory Visit (INDEPENDENT_AMBULATORY_CARE_PROVIDER_SITE_OTHER): Payer: Managed Care, Other (non HMO)

## 2015-11-11 DIAGNOSIS — J309 Allergic rhinitis, unspecified: Secondary | ICD-10-CM | POA: Diagnosis not present

## 2015-11-19 ENCOUNTER — Ambulatory Visit (INDEPENDENT_AMBULATORY_CARE_PROVIDER_SITE_OTHER): Payer: Managed Care, Other (non HMO)

## 2015-11-19 DIAGNOSIS — J309 Allergic rhinitis, unspecified: Secondary | ICD-10-CM

## 2015-11-26 ENCOUNTER — Ambulatory Visit (INDEPENDENT_AMBULATORY_CARE_PROVIDER_SITE_OTHER): Payer: Managed Care, Other (non HMO)

## 2015-11-26 DIAGNOSIS — J309 Allergic rhinitis, unspecified: Secondary | ICD-10-CM

## 2015-12-03 ENCOUNTER — Ambulatory Visit (INDEPENDENT_AMBULATORY_CARE_PROVIDER_SITE_OTHER): Payer: Managed Care, Other (non HMO)

## 2015-12-03 DIAGNOSIS — J309 Allergic rhinitis, unspecified: Secondary | ICD-10-CM

## 2015-12-10 ENCOUNTER — Ambulatory Visit: Payer: Managed Care, Other (non HMO)

## 2015-12-13 ENCOUNTER — Ambulatory Visit (INDEPENDENT_AMBULATORY_CARE_PROVIDER_SITE_OTHER): Payer: Managed Care, Other (non HMO)

## 2015-12-13 DIAGNOSIS — J309 Allergic rhinitis, unspecified: Secondary | ICD-10-CM

## 2015-12-17 ENCOUNTER — Ambulatory Visit: Payer: Managed Care, Other (non HMO)

## 2015-12-17 ENCOUNTER — Ambulatory Visit (INDEPENDENT_AMBULATORY_CARE_PROVIDER_SITE_OTHER): Payer: Managed Care, Other (non HMO)

## 2015-12-17 DIAGNOSIS — J309 Allergic rhinitis, unspecified: Secondary | ICD-10-CM | POA: Diagnosis not present

## 2015-12-24 ENCOUNTER — Ambulatory Visit (INDEPENDENT_AMBULATORY_CARE_PROVIDER_SITE_OTHER): Payer: Managed Care, Other (non HMO)

## 2015-12-24 DIAGNOSIS — J309 Allergic rhinitis, unspecified: Secondary | ICD-10-CM

## 2015-12-31 ENCOUNTER — Ambulatory Visit (INDEPENDENT_AMBULATORY_CARE_PROVIDER_SITE_OTHER): Payer: Managed Care, Other (non HMO)

## 2015-12-31 DIAGNOSIS — J309 Allergic rhinitis, unspecified: Secondary | ICD-10-CM | POA: Diagnosis not present

## 2016-01-07 ENCOUNTER — Ambulatory Visit (INDEPENDENT_AMBULATORY_CARE_PROVIDER_SITE_OTHER): Payer: Managed Care, Other (non HMO)

## 2016-01-07 DIAGNOSIS — J309 Allergic rhinitis, unspecified: Secondary | ICD-10-CM

## 2016-01-14 ENCOUNTER — Ambulatory Visit (INDEPENDENT_AMBULATORY_CARE_PROVIDER_SITE_OTHER): Payer: Managed Care, Other (non HMO)

## 2016-01-14 DIAGNOSIS — J309 Allergic rhinitis, unspecified: Secondary | ICD-10-CM

## 2016-01-21 ENCOUNTER — Ambulatory Visit (INDEPENDENT_AMBULATORY_CARE_PROVIDER_SITE_OTHER): Payer: Managed Care, Other (non HMO)

## 2016-01-21 DIAGNOSIS — J309 Allergic rhinitis, unspecified: Secondary | ICD-10-CM

## 2016-01-28 ENCOUNTER — Ambulatory Visit (INDEPENDENT_AMBULATORY_CARE_PROVIDER_SITE_OTHER): Payer: Managed Care, Other (non HMO)

## 2016-01-28 DIAGNOSIS — J309 Allergic rhinitis, unspecified: Secondary | ICD-10-CM

## 2016-01-30 ENCOUNTER — Telehealth: Payer: Self-pay | Admitting: Internal Medicine

## 2016-01-30 DIAGNOSIS — J309 Allergic rhinitis, unspecified: Secondary | ICD-10-CM | POA: Diagnosis not present

## 2016-01-30 NOTE — Telephone Encounter (Signed)
Allergy Serum Extract Date Mixed: 01/30/16 Vial: 1 Strength: 1:10 Here/Mail/Pick Up: here Mixed By: tbs Last OV: 09/11/15 Pending OV: 09/10/16

## 2016-02-04 ENCOUNTER — Ambulatory Visit (INDEPENDENT_AMBULATORY_CARE_PROVIDER_SITE_OTHER): Payer: Managed Care, Other (non HMO)

## 2016-02-04 DIAGNOSIS — J309 Allergic rhinitis, unspecified: Secondary | ICD-10-CM

## 2016-02-12 ENCOUNTER — Ambulatory Visit (INDEPENDENT_AMBULATORY_CARE_PROVIDER_SITE_OTHER): Payer: Managed Care, Other (non HMO)

## 2016-02-12 DIAGNOSIS — J309 Allergic rhinitis, unspecified: Secondary | ICD-10-CM

## 2016-02-18 ENCOUNTER — Ambulatory Visit (INDEPENDENT_AMBULATORY_CARE_PROVIDER_SITE_OTHER): Payer: Managed Care, Other (non HMO) | Admitting: *Deleted

## 2016-02-18 DIAGNOSIS — J309 Allergic rhinitis, unspecified: Secondary | ICD-10-CM

## 2016-02-28 ENCOUNTER — Ambulatory Visit (INDEPENDENT_AMBULATORY_CARE_PROVIDER_SITE_OTHER): Payer: Managed Care, Other (non HMO) | Admitting: *Deleted

## 2016-02-28 DIAGNOSIS — J309 Allergic rhinitis, unspecified: Secondary | ICD-10-CM | POA: Diagnosis not present

## 2016-03-03 ENCOUNTER — Ambulatory Visit (INDEPENDENT_AMBULATORY_CARE_PROVIDER_SITE_OTHER): Payer: Managed Care, Other (non HMO)

## 2016-03-03 DIAGNOSIS — J309 Allergic rhinitis, unspecified: Secondary | ICD-10-CM

## 2016-03-10 ENCOUNTER — Ambulatory Visit (INDEPENDENT_AMBULATORY_CARE_PROVIDER_SITE_OTHER): Payer: Managed Care, Other (non HMO) | Admitting: *Deleted

## 2016-03-10 DIAGNOSIS — J309 Allergic rhinitis, unspecified: Secondary | ICD-10-CM | POA: Diagnosis not present

## 2016-03-17 ENCOUNTER — Ambulatory Visit (INDEPENDENT_AMBULATORY_CARE_PROVIDER_SITE_OTHER): Payer: Managed Care, Other (non HMO) | Admitting: *Deleted

## 2016-03-17 DIAGNOSIS — J309 Allergic rhinitis, unspecified: Secondary | ICD-10-CM

## 2016-03-24 ENCOUNTER — Ambulatory Visit (INDEPENDENT_AMBULATORY_CARE_PROVIDER_SITE_OTHER): Payer: Managed Care, Other (non HMO) | Admitting: *Deleted

## 2016-03-24 DIAGNOSIS — J309 Allergic rhinitis, unspecified: Secondary | ICD-10-CM | POA: Diagnosis not present

## 2016-03-31 ENCOUNTER — Ambulatory Visit (INDEPENDENT_AMBULATORY_CARE_PROVIDER_SITE_OTHER): Payer: Managed Care, Other (non HMO) | Admitting: *Deleted

## 2016-03-31 DIAGNOSIS — J309 Allergic rhinitis, unspecified: Secondary | ICD-10-CM

## 2016-04-07 ENCOUNTER — Ambulatory Visit: Payer: Managed Care, Other (non HMO)

## 2016-04-07 ENCOUNTER — Ambulatory Visit (INDEPENDENT_AMBULATORY_CARE_PROVIDER_SITE_OTHER): Payer: Managed Care, Other (non HMO) | Admitting: *Deleted

## 2016-04-07 DIAGNOSIS — J309 Allergic rhinitis, unspecified: Secondary | ICD-10-CM | POA: Diagnosis not present

## 2016-04-14 ENCOUNTER — Ambulatory Visit (INDEPENDENT_AMBULATORY_CARE_PROVIDER_SITE_OTHER): Payer: Managed Care, Other (non HMO) | Admitting: *Deleted

## 2016-04-14 DIAGNOSIS — J309 Allergic rhinitis, unspecified: Secondary | ICD-10-CM

## 2016-04-21 ENCOUNTER — Ambulatory Visit (INDEPENDENT_AMBULATORY_CARE_PROVIDER_SITE_OTHER): Payer: Managed Care, Other (non HMO) | Admitting: *Deleted

## 2016-04-21 DIAGNOSIS — J309 Allergic rhinitis, unspecified: Secondary | ICD-10-CM | POA: Diagnosis not present

## 2016-04-28 ENCOUNTER — Ambulatory Visit (INDEPENDENT_AMBULATORY_CARE_PROVIDER_SITE_OTHER): Payer: Managed Care, Other (non HMO) | Admitting: *Deleted

## 2016-04-28 DIAGNOSIS — J309 Allergic rhinitis, unspecified: Secondary | ICD-10-CM | POA: Diagnosis not present

## 2016-05-05 ENCOUNTER — Ambulatory Visit (INDEPENDENT_AMBULATORY_CARE_PROVIDER_SITE_OTHER): Payer: Managed Care, Other (non HMO) | Admitting: *Deleted

## 2016-05-05 DIAGNOSIS — J309 Allergic rhinitis, unspecified: Secondary | ICD-10-CM | POA: Diagnosis not present

## 2016-05-07 ENCOUNTER — Encounter: Payer: Self-pay | Admitting: Internal Medicine

## 2016-05-12 ENCOUNTER — Ambulatory Visit (INDEPENDENT_AMBULATORY_CARE_PROVIDER_SITE_OTHER): Payer: Managed Care, Other (non HMO)

## 2016-05-12 DIAGNOSIS — J309 Allergic rhinitis, unspecified: Secondary | ICD-10-CM

## 2016-05-19 ENCOUNTER — Ambulatory Visit (INDEPENDENT_AMBULATORY_CARE_PROVIDER_SITE_OTHER): Payer: Managed Care, Other (non HMO) | Admitting: *Deleted

## 2016-05-19 DIAGNOSIS — J309 Allergic rhinitis, unspecified: Secondary | ICD-10-CM | POA: Diagnosis not present

## 2016-05-20 ENCOUNTER — Telehealth: Payer: Self-pay | Admitting: Internal Medicine

## 2016-05-20 DIAGNOSIS — J309 Allergic rhinitis, unspecified: Secondary | ICD-10-CM | POA: Diagnosis not present

## 2016-05-20 NOTE — Telephone Encounter (Signed)
Allergy Serum Extract Date Mixed: 05/20/16 Vial: 1 Strength: 1:10 Here/Mail/Pick Up: here Mixed By: tbs Last OV: 09/11/15 Pending OV: 09/10/16

## 2016-05-26 ENCOUNTER — Ambulatory Visit (INDEPENDENT_AMBULATORY_CARE_PROVIDER_SITE_OTHER): Payer: Managed Care, Other (non HMO) | Admitting: *Deleted

## 2016-05-26 DIAGNOSIS — J309 Allergic rhinitis, unspecified: Secondary | ICD-10-CM

## 2016-06-02 ENCOUNTER — Ambulatory Visit (INDEPENDENT_AMBULATORY_CARE_PROVIDER_SITE_OTHER): Payer: Managed Care, Other (non HMO) | Admitting: *Deleted

## 2016-06-02 DIAGNOSIS — J309 Allergic rhinitis, unspecified: Secondary | ICD-10-CM | POA: Diagnosis not present

## 2016-06-09 ENCOUNTER — Ambulatory Visit (INDEPENDENT_AMBULATORY_CARE_PROVIDER_SITE_OTHER): Payer: Managed Care, Other (non HMO) | Admitting: *Deleted

## 2016-06-09 DIAGNOSIS — J309 Allergic rhinitis, unspecified: Secondary | ICD-10-CM | POA: Diagnosis not present

## 2016-06-15 ENCOUNTER — Ambulatory Visit (INDEPENDENT_AMBULATORY_CARE_PROVIDER_SITE_OTHER): Payer: Managed Care, Other (non HMO) | Admitting: *Deleted

## 2016-06-15 DIAGNOSIS — J309 Allergic rhinitis, unspecified: Secondary | ICD-10-CM

## 2016-06-16 ENCOUNTER — Ambulatory Visit: Payer: Managed Care, Other (non HMO)

## 2016-06-22 ENCOUNTER — Ambulatory Visit (INDEPENDENT_AMBULATORY_CARE_PROVIDER_SITE_OTHER): Payer: Managed Care, Other (non HMO) | Admitting: *Deleted

## 2016-06-22 DIAGNOSIS — J309 Allergic rhinitis, unspecified: Secondary | ICD-10-CM | POA: Diagnosis not present

## 2016-06-30 ENCOUNTER — Ambulatory Visit (INDEPENDENT_AMBULATORY_CARE_PROVIDER_SITE_OTHER): Payer: Managed Care, Other (non HMO) | Admitting: *Deleted

## 2016-06-30 DIAGNOSIS — J309 Allergic rhinitis, unspecified: Secondary | ICD-10-CM

## 2016-07-07 ENCOUNTER — Ambulatory Visit (INDEPENDENT_AMBULATORY_CARE_PROVIDER_SITE_OTHER): Payer: Managed Care, Other (non HMO) | Admitting: *Deleted

## 2016-07-07 DIAGNOSIS — J309 Allergic rhinitis, unspecified: Secondary | ICD-10-CM | POA: Diagnosis not present

## 2016-07-14 ENCOUNTER — Ambulatory Visit (INDEPENDENT_AMBULATORY_CARE_PROVIDER_SITE_OTHER): Payer: Managed Care, Other (non HMO) | Admitting: *Deleted

## 2016-07-14 DIAGNOSIS — J309 Allergic rhinitis, unspecified: Secondary | ICD-10-CM

## 2016-07-21 ENCOUNTER — Ambulatory Visit (INDEPENDENT_AMBULATORY_CARE_PROVIDER_SITE_OTHER): Payer: Managed Care, Other (non HMO) | Admitting: *Deleted

## 2016-07-21 DIAGNOSIS — J309 Allergic rhinitis, unspecified: Secondary | ICD-10-CM

## 2016-07-28 ENCOUNTER — Ambulatory Visit: Payer: Managed Care, Other (non HMO)

## 2016-07-28 ENCOUNTER — Ambulatory Visit (INDEPENDENT_AMBULATORY_CARE_PROVIDER_SITE_OTHER): Payer: Managed Care, Other (non HMO)

## 2016-07-28 DIAGNOSIS — J309 Allergic rhinitis, unspecified: Secondary | ICD-10-CM | POA: Diagnosis not present

## 2016-08-04 ENCOUNTER — Ambulatory Visit (INDEPENDENT_AMBULATORY_CARE_PROVIDER_SITE_OTHER): Payer: Managed Care, Other (non HMO) | Admitting: *Deleted

## 2016-08-04 DIAGNOSIS — J309 Allergic rhinitis, unspecified: Secondary | ICD-10-CM | POA: Diagnosis not present

## 2016-08-13 ENCOUNTER — Ambulatory Visit (INDEPENDENT_AMBULATORY_CARE_PROVIDER_SITE_OTHER): Payer: Managed Care, Other (non HMO) | Admitting: *Deleted

## 2016-08-13 DIAGNOSIS — J309 Allergic rhinitis, unspecified: Secondary | ICD-10-CM

## 2016-08-18 ENCOUNTER — Ambulatory Visit (INDEPENDENT_AMBULATORY_CARE_PROVIDER_SITE_OTHER): Payer: Managed Care, Other (non HMO) | Admitting: *Deleted

## 2016-08-18 DIAGNOSIS — J309 Allergic rhinitis, unspecified: Secondary | ICD-10-CM | POA: Diagnosis not present

## 2016-08-25 ENCOUNTER — Ambulatory Visit: Payer: Managed Care, Other (non HMO)

## 2016-08-26 ENCOUNTER — Ambulatory Visit (INDEPENDENT_AMBULATORY_CARE_PROVIDER_SITE_OTHER): Payer: Managed Care, Other (non HMO) | Admitting: *Deleted

## 2016-08-26 DIAGNOSIS — J309 Allergic rhinitis, unspecified: Secondary | ICD-10-CM

## 2016-09-01 ENCOUNTER — Ambulatory Visit (INDEPENDENT_AMBULATORY_CARE_PROVIDER_SITE_OTHER): Payer: Managed Care, Other (non HMO) | Admitting: *Deleted

## 2016-09-01 DIAGNOSIS — J309 Allergic rhinitis, unspecified: Secondary | ICD-10-CM

## 2016-09-10 ENCOUNTER — Ambulatory Visit: Payer: Managed Care, Other (non HMO) | Admitting: Internal Medicine

## 2016-09-11 ENCOUNTER — Encounter: Payer: Self-pay | Admitting: Internal Medicine

## 2016-09-11 ENCOUNTER — Ambulatory Visit (INDEPENDENT_AMBULATORY_CARE_PROVIDER_SITE_OTHER): Payer: Managed Care, Other (non HMO) | Admitting: *Deleted

## 2016-09-11 ENCOUNTER — Ambulatory Visit (INDEPENDENT_AMBULATORY_CARE_PROVIDER_SITE_OTHER): Payer: Managed Care, Other (non HMO) | Admitting: Internal Medicine

## 2016-09-11 VITALS — BP 128/82 | HR 75 | Ht 66.0 in | Wt 229.0 lb

## 2016-09-11 DIAGNOSIS — J309 Allergic rhinitis, unspecified: Secondary | ICD-10-CM

## 2016-09-11 DIAGNOSIS — G4733 Obstructive sleep apnea (adult) (pediatric): Secondary | ICD-10-CM

## 2016-09-11 DIAGNOSIS — J301 Allergic rhinitis due to pollen: Secondary | ICD-10-CM

## 2016-09-11 DIAGNOSIS — Z23 Encounter for immunization: Secondary | ICD-10-CM

## 2016-09-11 MED ORDER — AZELASTINE HCL 0.15 % NA SOLN: 2.0000 | Freq: Two times a day (BID) | NASAL | 99 refills | Status: DC | PRN

## 2016-09-11 NOTE — Progress Notes (Signed)
Patient ID: Ralph Ware, male    DOB: [birth year], 60 y.o.   MRN: [id number]  HPI M never smoker followed for OSA, allergic rhinitis, complicated by HBP.   09/11/15- 58 yoM never smoker followed for OSA, allergic rhinitis, complicated by HBP Allergy vaccine 1:10 GH.   CPAP 8/ [facility] We tried adding singulair, nasalcrom at last ov FOLLOWS FOR: pt states he is doing well. pt using CPAP everynight for about 6 hours. pressure and mask good for pt. pt still recieving allergy vaccine every week and working well for him. no concerns at this time. Likes Astepro nasal spray. He didn't try either Singulair or Nasalcrom after last visit.  09/11/2016-60 year old male never smoker followed for OSA, allergic rhinitis, complicated by HBP Allergy Vaccine 1:10 GH    CPAP 8/[facility] FOLLOWS FOR: Wears CPAP nightly. Denies problems with mask or pressure. Patient having issues with DME. DME: [facility] ; Tolerating vaccine well - no new complaints.  Aware of some residual snoring. Download shows compliance only 53%/4 hours with residual AHI 34.4 which seems to be mostly obstructive apneas. He admits some missed and some short nights. He likes Astepro nasal spray and has started now in anticipation of fall allergy season.  Review of Systems-see HPI Constitutional:   No-   weight loss, night sweats, fevers, chills, +fatigue, lassitude. HEENT:   No-  headaches, difficulty swallowing, tooth/dental problems, sore throat,       No-  sneezing, itching, ear ache, + nasal congestion, post nasal drip,  CV:  No-   chest pain, orthopnea, PND, swelling in lower extremities, anasarca,  dizziness, palpitations Resp: No-   shortness of breath with exertion or at rest.              No-   productive cough,  No non-productive cough,  No-  coughing up of blood.              No-   change in color of mucus.  No- wheezing.   Skin: No-   rash or lesions. GI:  No-   heartburn, indigestion, abdominal pain, nausea,  vomiting, GU:. MS:  No-   joint pain or swelling.  . Neuro- nothing unusual Psych:  No- change in mood or affect. +depression or anxiety.  No memory loss.  Objective:   Physical Exam General- Alert, Oriented, Affect-appropriate, Distress- none acute  + Obese Skin- rash-none, lesions- none, excoriation- none Lymphadenopathy- none Head- atraumatic            Eyes- Gross vision intact, PERRLA, conjunctivae clear secretions. Periorbital edema            Ears- Hearing, canals- normal            Nose- Clear with turbinate edema, No-Septal dev ,No- polyps, erosion, perforation             Throat- Mallampati III-IV , mucosa red , drainage- none, tonsils- atrophic. Hoarse. Neck- flexible , trachea midline, no stridor , thyroid nl, carotid no bruit Chest - symmetrical excursion , unlabored           Heart/CV- RRR , no murmur , no gallop  , no rub, nl s1 s2                           - JVD- none , edema- none, stasis changes- none, varices- none           Lung- clear to P&A, wheeze- none, cough-  none , dullness-none, rub- none           Chest wall-  Abd-  Br/ Gen/ Rectal- Not done, not indicated Extrem- cyanosis- none, clubbing, none, atrophy- none, strength- nl Neuro- grossly intact to observation

## 2016-09-11 NOTE — Patient Instructions (Signed)
Refill script sent for Astepro nasal spray  Flu vax  Order [facility]CC- patient would like to change DME back to [facility] to continue VPAP S, 10 cwp, mask of choice, humidifier, supplies, AirView    Dx OSA  We can stop allergy shots as discussed at any time, up to closing of the allergy office this winter. Then you can traqnsger to another allergy office, or wait and see if you do well enough with antihistamines and nasal sprays that Dr Kevan NyGates can help you with.

## 2016-09-12 NOTE — Assessment & Plan Note (Signed)
He told nurse he was using CPAP every night but download shows only 53% 4 hour compliance and many missed nights. Control is not we want. We discussed comfort, goals of care. Plan-increase VPAP to 10 and help him with his request to change back to [facility] as his DME company.

## 2016-09-12 NOTE — Assessment & Plan Note (Signed)
He feels allergy vaccine has helped but also uses antihistamine nasal spray. We discussed anticipated closing of our allergy clinic this winter as I slowed down. I suggested he observe off of allergy vaccine, using nasal steroid, nasal antihistamine and OTC antihistamine as needed. If these are not sufficient then he will go to another allergy office as needed.

## 2016-09-16 ENCOUNTER — Encounter: Payer: Self-pay | Admitting: Internal Medicine

## 2016-09-16 ENCOUNTER — Ambulatory Visit (INDEPENDENT_AMBULATORY_CARE_PROVIDER_SITE_OTHER): Payer: Managed Care, Other (non HMO) | Admitting: *Deleted

## 2016-09-16 DIAGNOSIS — J309 Allergic rhinitis, unspecified: Secondary | ICD-10-CM | POA: Diagnosis not present

## 2016-09-18 ENCOUNTER — Telehealth: Payer: Self-pay | Admitting: Internal Medicine

## 2016-09-18 DIAGNOSIS — J309 Allergic rhinitis, unspecified: Secondary | ICD-10-CM | POA: Diagnosis not present

## 2016-09-18 NOTE — Telephone Encounter (Signed)
Allergy Serum Extract Date Mixed: 09/18/16 Vial: 1 Strength: 1:10 Here/Mail/Pick Up: here Mixed By: tbs Last OV: 09/11/16 Pending OV: N/A

## 2016-09-23 ENCOUNTER — Ambulatory Visit (INDEPENDENT_AMBULATORY_CARE_PROVIDER_SITE_OTHER): Payer: Managed Care, Other (non HMO) | Admitting: *Deleted

## 2016-09-23 DIAGNOSIS — J309 Allergic rhinitis, unspecified: Secondary | ICD-10-CM

## 2016-09-30 ENCOUNTER — Ambulatory Visit: Payer: Managed Care, Other (non HMO)

## 2016-10-20 ENCOUNTER — Ambulatory Visit (INDEPENDENT_AMBULATORY_CARE_PROVIDER_SITE_OTHER): Payer: Managed Care, Other (non HMO) | Admitting: *Deleted

## 2016-10-20 DIAGNOSIS — J309 Allergic rhinitis, unspecified: Secondary | ICD-10-CM | POA: Diagnosis not present

## 2016-10-26 ENCOUNTER — Ambulatory Visit: Payer: Managed Care, Other (non HMO)

## 2016-10-30 ENCOUNTER — Ambulatory Visit (INDEPENDENT_AMBULATORY_CARE_PROVIDER_SITE_OTHER): Payer: Managed Care, Other (non HMO) | Admitting: *Deleted

## 2016-10-30 DIAGNOSIS — J309 Allergic rhinitis, unspecified: Secondary | ICD-10-CM | POA: Diagnosis not present

## 2016-11-02 ENCOUNTER — Ambulatory Visit (INDEPENDENT_AMBULATORY_CARE_PROVIDER_SITE_OTHER): Payer: Managed Care, Other (non HMO) | Admitting: *Deleted

## 2016-11-02 DIAGNOSIS — J309 Allergic rhinitis, unspecified: Secondary | ICD-10-CM

## 2016-11-10 ENCOUNTER — Ambulatory Visit (INDEPENDENT_AMBULATORY_CARE_PROVIDER_SITE_OTHER): Payer: Managed Care, Other (non HMO) | Admitting: *Deleted

## 2016-11-10 DIAGNOSIS — J309 Allergic rhinitis, unspecified: Secondary | ICD-10-CM

## 2016-11-17 ENCOUNTER — Ambulatory Visit (INDEPENDENT_AMBULATORY_CARE_PROVIDER_SITE_OTHER): Payer: Managed Care, Other (non HMO) | Admitting: *Deleted

## 2016-11-17 DIAGNOSIS — J309 Allergic rhinitis, unspecified: Secondary | ICD-10-CM

## 2016-11-24 ENCOUNTER — Ambulatory Visit (INDEPENDENT_AMBULATORY_CARE_PROVIDER_SITE_OTHER): Payer: Managed Care, Other (non HMO) | Admitting: *Deleted

## 2016-11-24 DIAGNOSIS — J309 Allergic rhinitis, unspecified: Secondary | ICD-10-CM | POA: Diagnosis not present

## 2016-12-01 ENCOUNTER — Ambulatory Visit (INDEPENDENT_AMBULATORY_CARE_PROVIDER_SITE_OTHER): Payer: Managed Care, Other (non HMO) | Admitting: *Deleted

## 2016-12-01 DIAGNOSIS — J309 Allergic rhinitis, unspecified: Secondary | ICD-10-CM | POA: Diagnosis not present

## 2016-12-08 ENCOUNTER — Ambulatory Visit (INDEPENDENT_AMBULATORY_CARE_PROVIDER_SITE_OTHER): Payer: Managed Care, Other (non HMO) | Admitting: *Deleted

## 2016-12-08 DIAGNOSIS — J309 Allergic rhinitis, unspecified: Secondary | ICD-10-CM

## 2016-12-16 ENCOUNTER — Ambulatory Visit (INDEPENDENT_AMBULATORY_CARE_PROVIDER_SITE_OTHER): Payer: Managed Care, Other (non HMO) | Admitting: *Deleted

## 2016-12-16 DIAGNOSIS — J309 Allergic rhinitis, unspecified: Secondary | ICD-10-CM

## 2017-01-18 ENCOUNTER — Encounter (INDEPENDENT_AMBULATORY_CARE_PROVIDER_SITE_OTHER): Payer: Self-pay

## 2017-01-18 ENCOUNTER — Encounter: Payer: Self-pay | Admitting: Allergy & Immunology

## 2017-01-18 ENCOUNTER — Ambulatory Visit (INDEPENDENT_AMBULATORY_CARE_PROVIDER_SITE_OTHER): Payer: Managed Care, Other (non HMO) | Admitting: Allergy & Immunology

## 2017-01-18 VITALS — BP 130/90 | HR 74 | Temp 97.7°F | Resp 16 | Ht 64.76 in | Wt 232.0 lb

## 2017-01-18 DIAGNOSIS — J3089 Other allergic rhinitis: Secondary | ICD-10-CM | POA: Diagnosis not present

## 2017-01-18 DIAGNOSIS — J310 Chronic rhinitis: Secondary | ICD-10-CM | POA: Diagnosis not present

## 2017-01-18 DIAGNOSIS — T781XXD Other adverse food reactions, not elsewhere classified, subsequent encounter: Secondary | ICD-10-CM

## 2017-01-18 DIAGNOSIS — T781XXA Other adverse food reactions, not elsewhere classified, initial encounter: Secondary | ICD-10-CM | POA: Insufficient documentation

## 2017-01-18 MED ORDER — EPINEPHRINE 0.3 MG/0.3ML IJ SOAJ: 0.3000 mg | Freq: Once | INTRAMUSCULAR | 1 refills | Status: AC

## 2017-01-18 NOTE — Progress Notes (Signed)
NEW PATIENT  Date of Service/Encounter:  01/18/17  Referring provider: Pearla Dubonnet, MD   Assessment:   Chronic nonseasonal allergic rhinitis due to fungal spores  Adverse food reaction, subsequent encounter   Plan/Recommendations:   1. Chronic rhinitis - Testing today showed: positive to bahia grass, [place] blue grass, birch, maple, pecan pollen, black walnut pollen, Alternaria, Cladosporium, Bipolaris, Drechslera, Fusarium, Aspergillus, Pencillium, dust mite, and cat - Avoidance measures discussed.  - Continue with Xyzal daily. - Continue with Astepro as needed during the worst times of the year. - Information on allergy shots provided. - We will mix the vials and you can start the new vials in two weeks.  2. Adverse food reaction - Testing was negative to the most common foods.  - Keep a good food diary and we are do more targeted testing at the next visit.  3. Return in about 6 months (around 07/18/2017).      Subjective:   Ralph Ware is a 61 y.o. male presenting today for evaluation of  Chief Complaint  Patient presents with  . Allergic Rhinitis     Ralph Ware has a history of the following: Patient Active Problem List   Diagnosis Date Noted  . Allergic rhinitis due to pollen 02/24/2011  . Obstructive sleep apnea 08/30/2009  . RHINOSINUSITIS, ACUTE 01/03/2009  . HYPERLIPIDEMIA 06/28/2008  . HYPERTENSION 06/28/2008    History obtained from: chart review and patient.  Ralph Ware was referred by Pearla Dubonnet, MD.     Ralph Ware is a 61 y.o. male presenting to establish care. He is transferring his vials from Dr. Roxy Cedar office.   Ralph Ware reports that he has had allergies for decades. He was last allergy tested in the early 2000s and has been on allergy shots since 2005. He has been going weekly for that entire time. His symptoms include rhinorrhea as well as sinus pain and pressure. Occasionally he will get a cold which leads to a  sinus infection. He estimates that he gets sinus infections that require antibiotics around 1 time per year. He is on Xyzal one half of a tablet daily as well as Astepro during the worst seasons. He has been on nasal steroids in the past, but gets nosebleeds with these. He uses nasal saline rinses every morning.  Ralph Ware has no known history of food allergies. However, he does report becoming "phlegmy" and clears his throat after eating mayonnaise. He is interested in milk testing due to some vague abdominal pain with cows milk. He does drink Lactaid without a problem. He eats scrambled eggs, he also endorses some vague symptoms with shellfish. He eats wheat all of the time without a problem.  Ralph Ware does have a history of a deviated septum which is being corrected on 2 different occasions. He also has a history of polyposis and had these removed in the early 1990s. He does not see an ENT regularly at this time. He denies problems with NSAID sensitivity.  Ralph Ware has been on shots since 2005. He has been going weekly for that entire time. Symptoms include rhinorrhea. Occasionally it will turn into a cold and then a sinus infection. He estimates that he gets antibiotics around once per year or less. He is on antihistamines Xyzal 1/2 tablet every day. He will use Astepro during the worst seasons. He cannot do steroids because he gets nose bleeds. He uses nasal saline rinses every morning.   Ralph Ware does have a history of a rash  on the back of his neck. He is followed by Dr. Janalyn HarderStuart Tafeen. Currently it is being treated with topical clindamycin. It has not be biopsied yet.   Otherwise, there is no history of other atopic diseases, including asthma, drug allergies, stinging insect allergies, or urticaria. There is no significant infectious history. Vaccinations are up to date.    Past Medical History: Patient Active Problem List   Diagnosis Date Noted  . Allergic rhinitis due to pollen  02/24/2011  . Obstructive sleep apnea 08/30/2009  . RHINOSINUSITIS, ACUTE 01/03/2009  . HYPERLIPIDEMIA 06/28/2008  . HYPERTENSION 06/28/2008    Medication List:  Allergies as of 01/18/2017   No Known Allergies     Medication List       Accurate as of 01/18/17  3:53 PM. Always use your most recent med list.          amLODipine 2.5 MG tablet Commonly known as:  NORVASC TK 1 T PO D   aspirin 81 MG tablet Take 81 mg by mouth daily.   AZASITE 1 % ophthalmic solution Generic drug:  azithromycin   Azelastine HCl 0.15 % Soln Commonly known as:  ASTEPRO Place 2 sprays into the nose 2 (two) times daily as needed.   clindamycin 1 % gel Commonly known as:  CLINDAGEL APP TO NECK BID   CoQ10 100 MG Caps Take 1 capsule by mouth daily.   diphenhydrAMINE 25 MG tablet Commonly known as:  BENADRYL Take 25 mg by mouth at bedtime as needed.   levocetirizine 5 MG tablet Commonly known as:  XYZAL 1 daily for allergy   losartan-hydrochlorothiazide 100-12.5 MG tablet Commonly known as:  HYZAAR Take 1 tablet by mouth daily.   Magnesium 250 MG Tabs Take 1 tablet by mouth daily.   NONFORMULARY OR COMPOUNDED ITEM Allergy Vaccine 1:10 Given at [facility]   simvastatin 40 MG tablet Commonly known as:  ZOCOR Take 1 tablet by mouth daily.   vitamin C 1000 MG tablet Take 1,000 mg by mouth daily.   Vitamin D 1000 units capsule Take 1,000 Units by mouth daily.   zinc gluconate 50 MG tablet Take 50 mg by mouth daily.       Birth History: non-contributory.   Developmental History: non-contributory.   Past Surgical History: Past Surgical History:  Procedure Laterality Date  . NASAL SEPTUM SURGERY     x2     Family History: Family History  Problem Relation Age of Onset  . Allergic rhinitis Neg Hx   . Angioedema Neg Hx   . Asthma Neg Hx   . Atopy Neg Hx   . Eczema Neg Hx   . Immunodeficiency Neg Hx   . Urticaria Neg Hx      Social History: Ralph GristDave lives at  home with his wife. He lives in a 63 year old home. There is carpeting throughout. They have gas heating and central cooling. There are no mildew or red tissues in the home. He does not have dust mite covers on his bedding. There is no tobacco exposure. There is a cat in the home. 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.   Review of Systems: a 14-point review of systems is pertinent for what is mentioned in HPI.  Otherwise, all other systems were negative. Constitutional: negative other than that listed in the HPI Eyes: negative other than that listed in the HPI Ears, nose, mouth, throat, and face: negative other than that listed in the HPI Respiratory: negative other than that  listed in the HPI Cardiovascular: negative other than that listed in the HPI Gastrointestinal: negative other than that listed in the HPI Genitourinary: negative other than that listed in the HPI Integument: negative other than that listed in the HPI Hematologic: negative other than that listed in the HPI Musculoskeletal: negative other than that listed in the HPI Neurological: negative other than that listed in the HPI Allergy/Immunologic: negative other than that listed in the HPI    Objective:   Blood pressure 130/90, pulse 74, temperature 97.7 F (36.5 C), temperature source Oral, resp. rate 16, height 5' 4.76" (1.645 m), weight 232 lb (105.2 kg), SpO2 95 %. Body mass index is 38.89 kg/m.   Physical Exam:  General: Alert, interactive, in no acute distress. Cooperative with the exam. Obese male.  Eyes: No conjunctival injection present on the right, No conjunctival injection present on the left, PERRL bilaterally, No discharge on the right, No discharge on the left and No Horner-Trantas dots present Ears: Right TM pearly gray with normal light reflex, Left TM pearly gray with normal light reflex, Right TM intact without perforation and Left TM intact without perforation.    Nose/Throat: nasal polyp versus clear mucosu within the left nasal cavity, External nose within normal limits, nasal crease present and septum midline, turbinates markedly edematous with clear discharge, post-pharynx erythematous with cobblestoning in the posterior oropharynx. Tonsils 2+ without exudates Neck: Supple without thyromegaly. Adenopathy: no enlarged lymph nodes appreciated in the anterior cervical, occipital, axillary, epitrochlear, inguinal, or popliteal regions Lungs: Clear to auscultation without wheezing, rhonchi or rales. No increased work of breathing. CV: Normal S1/S2, no murmurs. Capillary refill <2 seconds.  Abdomen: Nondistended, nontender. No guarding or rebound tenderness. Bowel sounds present in all fields and hypoactive  Skin: Warm and dry, without lesions or rashes. Extremities:  No clubbing, cyanosis or edema. Neuro:   Grossly intact. No focal deficits appreciated. Responsive to questions.  Diagnostic studies:   Allergy Studies:   Indoor/Outdoor Percutaneous Adult Environmental Panel: positive to bahia grass, [place] blue grass, birch, maple, pecan pollen, black walnut pollen, Alternaria, Cladosporium, Bipolaris, Drechslera, Fusarium and cat. Otherwise negative with adequate controls.  Indoor/Outdoor Selected Intradermal Environmental Panel: positive to ragweed mix, mold mix #2 and mite mix. Otherwise negative with adequate controls.  Most Common Foods Panel (peanut, tree nut, soy, fish mix, shellfish mix, wheat, milk, egg): negative with adequate controls     Malachi Bonds, MD [facility]

## 2017-01-18 NOTE — Patient Instructions (Addendum)
1. Chronic rhinitis - Testing today showed: positive to bahia grass, [place] blue grass, birch, maple, pecan pollen, black walnut pollen, Alternaria, Cladosporium, Bipolaris, Drechslera, Fusarium, Aspergillus, Pencillium, dust mite, and cat - Continue with Xyzal daily. - Continue with Astepro as needed during the worst times of the year. - Information on allergy shots provided. - We will mix the vials and you can start the new vials in two weeks.  2. Adverse food reaction - Testing was negative to the most common foods.  - Keep a good food diary and we are do more targeted testing at the next visit.  3. Return in about 6 months (around 07/18/2017).  Please inform us of any Emergency Department visits, hospitalizations, or changes in symptoms. Call us before going to the ED for breathing or allergy symptoms since we might be able to fit you in for a sick visit. Feel free to contact us anytime with any questions, problems, or concerns.  It was a pleasure to meet you today! Best wishes in the [place] Year!   Websites that have reliable patient information: 1. American Academy of Asthma, Allergy, and Immunology: www.aaaai.org 2. Food Allergy Research and Education (FARE): foodallergy.org 3. Mothers of Asthmatics: http://www.asthmacommunitynetwork.org 4. American College of Allergy, Asthma, and Immunology: www.acaai.org  Reducing Pollen Exposure  The American Academy of Allergy, Asthma and Immunology suggests the following steps to reduce your exposure to pollen during allergy seasons.    1. Do not hang sheets or clothing out to dry; pollen may collect on these items. 2. Do not mow lawns or spend time around freshly cut grass; mowing stirs up pollen. 3. Keep windows closed at night.  Keep car windows closed while driving. 4. Minimize morning activities outdoors, a time when pollen counts are usually at their highest. 5. Stay indoors as much as possible when pollen counts or humidity is high and  on windy days when pollen tends to remain in the air longer. 6. Use air conditioning when possible.  Many air conditioners have filters that trap the pollen spores. 7. Use a HEPA room air filter to remove pollen form the indoor air you breathe.  Control of Mold Allergen  Mold and fungi can grow on a variety of surfaces provided certain temperature and moisture conditions exist.  Outdoor molds grow on plants, decaying vegetation and soil.  The major outdoor mold, Alternaria and Cladosporium, are found in very high numbers during hot and dry conditions.  Generally, a late Summer - Fall peak is seen for common outdoor fungal spores.  Rain will temporarily lower outdoor mold spore count, but counts rise rapidly when the rainy period ends.  The most important indoor molds are Aspergillus and Penicillium.  Dark, humid and poorly ventilated basements are ideal sites for mold growth.  The next most common sites of mold growth are the bathroom and the kitchen.  Outdoor [employer] 1. Use air conditioning and keep windows closed 2. Avoid exposure to decaying vegetation. 3. Avoid leaf raking. 4. Avoid grain handling. 5. Consider wearing a face mask if working in moldy areas.  Indoor Mold Control 1. Maintain humidity below 50%. 2. Clean washable surfaces with 5% bleach solution. 3. Remove sources e.g. contaminated carpets.  Control of Dog or Cat Allergen  Avoidance is the best way to manage a dog or cat allergy. If you have a dog or cat and are allergic to dog or cats, consider removing the dog or cat from the home. If you have a dog or  cat but don't want to find it a new home, or if your family wants a pet even though someone in the household is allergic, here are some strategies that may help keep symptoms at bay:  1. Keep the pet out of your bedroom and restrict it to only a few rooms. Be advised that keeping the dog or cat in only one room will not limit the allergens to that room. 2. Don't pet,  hug or kiss the dog or cat; if you do, wash your hands with soap and water. 3. High-efficiency particulate air (HEPA) cleaners run continuously in a bedroom or living room can reduce allergen levels over time. 4. Regular use of a high-efficiency vacuum cleaner or a central vacuum can reduce allergen levels. 5. Giving your dog or cat a bath at least once a week can reduce airborne allergen.  Control of House Dust Mite Allergen    House dust mites play a major role in allergic asthma and rhinitis.  They occur in environments with high humidity wherever human skin, the food for dust mites is found. High levels have been detected in dust obtained from mattresses, pillows, carpets, upholstered furniture, bed covers, clothes and soft toys.  The principal allergen of the house dust mite is found in its feces.  A gram of dust may contain 1,000 mites and 250,000 fecal particles.  Mite antigen is easily measured in the air during house cleaning activities.    1. Encase mattresses, including the box spring, and pillow, in an air tight cover.  Seal the zipper end of the encased mattresses with wide adhesive tape. 2. Wash the bedding in water of 130 degrees Farenheit weekly.  Avoid cotton comforters/quilts and flannel bedding: the most ideal bed covering is the dacron comforter. 3. Remove all upholstered furniture from the bedroom. 4. Remove carpets, carpet padding, rugs, and non-washable window drapes from the bedroom.  Wash drapes weekly or use plastic window coverings. 5. Remove all non-washable stuffed toys from the bedroom.  Wash stuffed toys weekly. 6. Have the room cleaned frequently with a vacuum cleaner and a damp dust-mop.  The patient should not be in a room which is being cleaned and should wait 1 hour after cleaning before going into the room. 7. Close and seal all heating outlets in the bedroom.  Otherwise, the room will become filled with dust-laden air.  An electric heater can be used to heat  the room. 8. Reduce indoor humidity to less than 50%.  Do not use a humidifier.

## 2017-01-26 ENCOUNTER — Ambulatory Visit (INDEPENDENT_AMBULATORY_CARE_PROVIDER_SITE_OTHER): Payer: Managed Care, Other (non HMO)

## 2017-01-26 DIAGNOSIS — J309 Allergic rhinitis, unspecified: Secondary | ICD-10-CM | POA: Diagnosis not present

## 2017-02-02 ENCOUNTER — Ambulatory Visit (INDEPENDENT_AMBULATORY_CARE_PROVIDER_SITE_OTHER): Payer: Managed Care, Other (non HMO) | Admitting: *Deleted

## 2017-02-02 DIAGNOSIS — J309 Allergic rhinitis, unspecified: Secondary | ICD-10-CM

## 2017-02-05 ENCOUNTER — Other Ambulatory Visit: Payer: Self-pay | Admitting: Internal Medicine

## 2017-02-05 DIAGNOSIS — R1310 Dysphagia, unspecified: Secondary | ICD-10-CM

## 2017-02-05 DIAGNOSIS — R1319 Other dysphagia: Secondary | ICD-10-CM

## 2017-02-08 ENCOUNTER — Ambulatory Visit (INDEPENDENT_AMBULATORY_CARE_PROVIDER_SITE_OTHER): Payer: Managed Care, Other (non HMO)

## 2017-02-08 ENCOUNTER — Ambulatory Visit
Admission: RE | Admit: 2017-02-08 | Discharge: 2017-02-08 | Disposition: A | Discharge status: Home / Self Care | Payer: Managed Care, Other (non HMO) | Source: Ambulatory Visit | Attending: Internal Medicine | Admitting: Internal Medicine

## 2017-02-08 DIAGNOSIS — J309 Allergic rhinitis, unspecified: Secondary | ICD-10-CM

## 2017-02-08 DIAGNOSIS — R1319 Other dysphagia: Secondary | ICD-10-CM

## 2017-02-08 DIAGNOSIS — R1310 Dysphagia, unspecified: Secondary | ICD-10-CM

## 2017-02-16 ENCOUNTER — Ambulatory Visit (INDEPENDENT_AMBULATORY_CARE_PROVIDER_SITE_OTHER): Payer: Managed Care, Other (non HMO) | Admitting: *Deleted

## 2017-02-16 DIAGNOSIS — J309 Allergic rhinitis, unspecified: Secondary | ICD-10-CM | POA: Diagnosis not present

## 2017-02-17 ENCOUNTER — Telehealth: Payer: Self-pay | Admitting: *Deleted

## 2017-02-17 NOTE — Telephone Encounter (Signed)
error 

## 2017-02-24 DIAGNOSIS — J3089 Other allergic rhinitis: Secondary | ICD-10-CM | POA: Diagnosis not present

## 2017-02-25 ENCOUNTER — Other Ambulatory Visit: Payer: Self-pay | Admitting: Allergy and Immunology

## 2017-02-25 DIAGNOSIS — J301 Allergic rhinitis due to pollen: Secondary | ICD-10-CM | POA: Diagnosis not present

## 2017-02-25 DIAGNOSIS — J3089 Other allergic rhinitis: Secondary | ICD-10-CM

## 2017-02-26 ENCOUNTER — Encounter: Payer: Self-pay | Admitting: *Deleted

## 2017-03-02 ENCOUNTER — Ambulatory Visit (INDEPENDENT_AMBULATORY_CARE_PROVIDER_SITE_OTHER): Payer: Managed Care, Other (non HMO) | Admitting: *Deleted

## 2017-03-02 DIAGNOSIS — J309 Allergic rhinitis, unspecified: Secondary | ICD-10-CM

## 2017-03-03 NOTE — Progress Notes (Signed)
Immunotherapy   Patient Details  Name: Thamas JaegersDave Evett MRN: [id number] Date of Birth: [birth year]  03/03/2017  Thamas Jaegersave Pro started injections for  Pollen-Cat-Mite & Mold-Weed. Following schedule: B  Frequency:2 times per week Epi-Pen:Epi-Pen Available  Consent signed and patient instructions given. No problems after 30 minutes in the office.    Vella RedheadHeather Clark 03/03/2017, 11:07 AM

## 2017-03-05 ENCOUNTER — Ambulatory Visit (INDEPENDENT_AMBULATORY_CARE_PROVIDER_SITE_OTHER): Payer: Managed Care, Other (non HMO) | Admitting: *Deleted

## 2017-03-05 DIAGNOSIS — J309 Allergic rhinitis, unspecified: Secondary | ICD-10-CM | POA: Diagnosis not present

## 2017-03-10 ENCOUNTER — Ambulatory Visit (INDEPENDENT_AMBULATORY_CARE_PROVIDER_SITE_OTHER): Payer: Managed Care, Other (non HMO) | Admitting: *Deleted

## 2017-03-10 DIAGNOSIS — J309 Allergic rhinitis, unspecified: Secondary | ICD-10-CM

## 2017-03-16 ENCOUNTER — Ambulatory Visit (INDEPENDENT_AMBULATORY_CARE_PROVIDER_SITE_OTHER): Payer: Managed Care, Other (non HMO) | Admitting: *Deleted

## 2017-03-16 DIAGNOSIS — J309 Allergic rhinitis, unspecified: Secondary | ICD-10-CM | POA: Diagnosis not present

## 2017-03-18 ENCOUNTER — Ambulatory Visit (INDEPENDENT_AMBULATORY_CARE_PROVIDER_SITE_OTHER): Payer: Managed Care, Other (non HMO) | Admitting: *Deleted

## 2017-03-18 DIAGNOSIS — J309 Allergic rhinitis, unspecified: Secondary | ICD-10-CM | POA: Diagnosis not present

## 2017-03-22 ENCOUNTER — Ambulatory Visit (INDEPENDENT_AMBULATORY_CARE_PROVIDER_SITE_OTHER): Payer: Managed Care, Other (non HMO) | Admitting: *Deleted

## 2017-03-22 DIAGNOSIS — J309 Allergic rhinitis, unspecified: Secondary | ICD-10-CM | POA: Diagnosis not present

## 2017-03-25 ENCOUNTER — Ambulatory Visit (INDEPENDENT_AMBULATORY_CARE_PROVIDER_SITE_OTHER): Payer: Managed Care, Other (non HMO) | Admitting: *Deleted

## 2017-03-25 DIAGNOSIS — J309 Allergic rhinitis, unspecified: Secondary | ICD-10-CM

## 2017-03-30 ENCOUNTER — Ambulatory Visit (INDEPENDENT_AMBULATORY_CARE_PROVIDER_SITE_OTHER): Payer: Managed Care, Other (non HMO) | Admitting: *Deleted

## 2017-03-30 DIAGNOSIS — J309 Allergic rhinitis, unspecified: Secondary | ICD-10-CM

## 2017-04-02 ENCOUNTER — Ambulatory Visit (INDEPENDENT_AMBULATORY_CARE_PROVIDER_SITE_OTHER): Payer: Managed Care, Other (non HMO) | Admitting: *Deleted

## 2017-04-02 DIAGNOSIS — J309 Allergic rhinitis, unspecified: Secondary | ICD-10-CM

## 2017-04-05 ENCOUNTER — Ambulatory Visit (INDEPENDENT_AMBULATORY_CARE_PROVIDER_SITE_OTHER): Payer: Managed Care, Other (non HMO) | Admitting: *Deleted

## 2017-04-05 DIAGNOSIS — J309 Allergic rhinitis, unspecified: Secondary | ICD-10-CM | POA: Diagnosis not present

## 2017-04-08 ENCOUNTER — Ambulatory Visit (INDEPENDENT_AMBULATORY_CARE_PROVIDER_SITE_OTHER): Payer: Managed Care, Other (non HMO)

## 2017-04-08 DIAGNOSIS — J309 Allergic rhinitis, unspecified: Secondary | ICD-10-CM

## 2017-04-13 ENCOUNTER — Ambulatory Visit (INDEPENDENT_AMBULATORY_CARE_PROVIDER_SITE_OTHER): Payer: Managed Care, Other (non HMO) | Admitting: *Deleted

## 2017-04-13 DIAGNOSIS — J309 Allergic rhinitis, unspecified: Secondary | ICD-10-CM | POA: Diagnosis not present

## 2017-04-16 ENCOUNTER — Ambulatory Visit (INDEPENDENT_AMBULATORY_CARE_PROVIDER_SITE_OTHER): Payer: Managed Care, Other (non HMO)

## 2017-04-16 DIAGNOSIS — J309 Allergic rhinitis, unspecified: Secondary | ICD-10-CM | POA: Diagnosis not present

## 2017-04-20 ENCOUNTER — Ambulatory Visit (INDEPENDENT_AMBULATORY_CARE_PROVIDER_SITE_OTHER): Payer: Managed Care, Other (non HMO) | Admitting: *Deleted

## 2017-04-20 DIAGNOSIS — J309 Allergic rhinitis, unspecified: Secondary | ICD-10-CM

## 2017-04-23 ENCOUNTER — Ambulatory Visit (INDEPENDENT_AMBULATORY_CARE_PROVIDER_SITE_OTHER): Payer: Managed Care, Other (non HMO)

## 2017-04-23 DIAGNOSIS — J309 Allergic rhinitis, unspecified: Secondary | ICD-10-CM | POA: Diagnosis not present

## 2017-04-27 ENCOUNTER — Ambulatory Visit (INDEPENDENT_AMBULATORY_CARE_PROVIDER_SITE_OTHER): Payer: Managed Care, Other (non HMO) | Admitting: *Deleted

## 2017-04-27 DIAGNOSIS — J309 Allergic rhinitis, unspecified: Secondary | ICD-10-CM

## 2017-04-30 ENCOUNTER — Ambulatory Visit (INDEPENDENT_AMBULATORY_CARE_PROVIDER_SITE_OTHER): Payer: Managed Care, Other (non HMO) | Admitting: *Deleted

## 2017-04-30 DIAGNOSIS — J309 Allergic rhinitis, unspecified: Secondary | ICD-10-CM | POA: Diagnosis not present

## 2017-05-04 ENCOUNTER — Ambulatory Visit (INDEPENDENT_AMBULATORY_CARE_PROVIDER_SITE_OTHER): Payer: Managed Care, Other (non HMO)

## 2017-05-04 DIAGNOSIS — J309 Allergic rhinitis, unspecified: Secondary | ICD-10-CM | POA: Diagnosis not present

## 2017-05-11 ENCOUNTER — Ambulatory Visit (INDEPENDENT_AMBULATORY_CARE_PROVIDER_SITE_OTHER): Payer: Managed Care, Other (non HMO) | Admitting: *Deleted

## 2017-05-11 DIAGNOSIS — J309 Allergic rhinitis, unspecified: Secondary | ICD-10-CM

## 2017-05-21 ENCOUNTER — Ambulatory Visit (INDEPENDENT_AMBULATORY_CARE_PROVIDER_SITE_OTHER): Payer: Managed Care, Other (non HMO)

## 2017-05-21 DIAGNOSIS — J309 Allergic rhinitis, unspecified: Secondary | ICD-10-CM | POA: Diagnosis not present

## 2017-05-25 ENCOUNTER — Ambulatory Visit (INDEPENDENT_AMBULATORY_CARE_PROVIDER_SITE_OTHER): Payer: Managed Care, Other (non HMO) | Admitting: *Deleted

## 2017-05-25 DIAGNOSIS — J309 Allergic rhinitis, unspecified: Secondary | ICD-10-CM

## 2017-06-01 ENCOUNTER — Ambulatory Visit (INDEPENDENT_AMBULATORY_CARE_PROVIDER_SITE_OTHER): Payer: Managed Care, Other (non HMO) | Admitting: *Deleted

## 2017-06-01 DIAGNOSIS — J309 Allergic rhinitis, unspecified: Secondary | ICD-10-CM

## 2017-06-08 ENCOUNTER — Ambulatory Visit (INDEPENDENT_AMBULATORY_CARE_PROVIDER_SITE_OTHER): Payer: Managed Care, Other (non HMO) | Admitting: *Deleted

## 2017-06-08 DIAGNOSIS — J309 Allergic rhinitis, unspecified: Secondary | ICD-10-CM | POA: Diagnosis not present

## 2017-06-15 ENCOUNTER — Ambulatory Visit (INDEPENDENT_AMBULATORY_CARE_PROVIDER_SITE_OTHER): Payer: Managed Care, Other (non HMO) | Admitting: *Deleted

## 2017-06-15 DIAGNOSIS — J309 Allergic rhinitis, unspecified: Secondary | ICD-10-CM | POA: Diagnosis not present

## 2017-06-24 ENCOUNTER — Ambulatory Visit (INDEPENDENT_AMBULATORY_CARE_PROVIDER_SITE_OTHER): Payer: Managed Care, Other (non HMO) | Admitting: *Deleted

## 2017-06-24 DIAGNOSIS — J309 Allergic rhinitis, unspecified: Secondary | ICD-10-CM | POA: Diagnosis not present

## 2017-06-29 ENCOUNTER — Ambulatory Visit (INDEPENDENT_AMBULATORY_CARE_PROVIDER_SITE_OTHER): Payer: Managed Care, Other (non HMO) | Admitting: *Deleted

## 2017-06-29 DIAGNOSIS — J309 Allergic rhinitis, unspecified: Secondary | ICD-10-CM

## 2017-07-06 ENCOUNTER — Ambulatory Visit (INDEPENDENT_AMBULATORY_CARE_PROVIDER_SITE_OTHER): Payer: Managed Care, Other (non HMO) | Admitting: *Deleted

## 2017-07-06 DIAGNOSIS — J309 Allergic rhinitis, unspecified: Secondary | ICD-10-CM | POA: Diagnosis not present

## 2017-07-06 NOTE — Progress Notes (Signed)
VIALS EXP- 07/09/18 

## 2017-07-09 DIAGNOSIS — J301 Allergic rhinitis due to pollen: Secondary | ICD-10-CM | POA: Diagnosis not present

## 2017-07-14 ENCOUNTER — Encounter: Payer: Self-pay | Admitting: Allergy & Immunology

## 2017-07-14 ENCOUNTER — Ambulatory Visit: Payer: Self-pay

## 2017-07-14 ENCOUNTER — Ambulatory Visit (INDEPENDENT_AMBULATORY_CARE_PROVIDER_SITE_OTHER): Payer: Managed Care, Other (non HMO) | Admitting: Allergy & Immunology

## 2017-07-14 VITALS — BP 120/72 | HR 71 | Temp 98.6°F | Resp 19

## 2017-07-14 DIAGNOSIS — J309 Allergic rhinitis, unspecified: Secondary | ICD-10-CM

## 2017-07-14 DIAGNOSIS — J302 Other seasonal allergic rhinitis: Secondary | ICD-10-CM | POA: Insufficient documentation

## 2017-07-14 DIAGNOSIS — J3089 Other allergic rhinitis: Secondary | ICD-10-CM | POA: Diagnosis not present

## 2017-07-14 NOTE — Progress Notes (Signed)
FOLLOW UP  Date of Service/Encounter:  07/14/17   Assessment:   Seasonal and perennial allergic rhinitis   Plan/Recommendations:   1. Chronic rhinitis (grasses, trees, molds, dust mite, cat) - Continue allergy shots at the same schedule. - Continue with Xyzal daily.  - Continue with Astepro as needed during the worst times of the year. - His current allergen immunotherapy prescription is lacking the following allergens: dust mite, Cladosporium, Bipolaris, Drechslera, Fusarium, Bahia grass, birch pollen, pecan pollen, and black walnut pollen.  - However, since he reports that he is doing well with the current allergy shot, we will continue with the vials that he is using. - If we re-mixed the vials, he would have to restart at the lowest concentration. - I will follow up with Jacqulyn CaneJanet McDowell to see why another allergen immunotherapy script was ordered by a different provider.   2. Return in about 1 year (around 07/14/2018).   Subjective:   Ralph Ware is a 61 y.o. male presenting today for follow up of  Chief Complaint  Patient presents with  . Allergies    Taking Xyzal with relief, Astepro prn.     Ralph Ware has a history of the following: Patient Active Problem List   Diagnosis Date Noted  . Seasonal and perennial allergic rhinitis 07/14/2017  . Adverse food reaction 01/18/2017  . Allergic rhinitis due to pollen 02/24/2011  . Obstructive sleep apnea 08/30/2009  . RHINOSINUSITIS, ACUTE 01/03/2009  . HYPERLIPIDEMIA 06/28/2008  . HYPERTENSION 06/28/2008  . Chronic nonseasonal allergic rhinitis due to fungal spores 10/12/2007    History obtained from: chart review and patient.  Ralph Jaegersave Bettes was referred by Marden NobleGates, Robert, MD.     Ralph Ware is a 61 y.o. male presenting for a follow up visit. He was last seen in January 2018 for his new patient appointment. He had testing at that time that was positive to grasses, ragweed, trees, molds, dust mite, and cat. We started Xyzal  5mg  and continue Astepro daily PRN during the worst times of the year. We also started him on allergy ots in our practice, but previously he had received injections at Dr. Roxy CedarYoung's office. Review of his allergy prescriptions show that I put in an order in January 2018, but for whatever reason Dr. Lucie LeatherKozlow put in another prescription in March 2018 for unknown reasons. Dr. Kathyrn LassKozlow's prescription lacks dust mite as well as several molds and trees that my original order contained.    Since the last visit, he has done well. He is tolerating his injections without adverse event. Ralph Ware is on allergen immunotherapy. He receives two injections. Immunotherapy script #1 contains ragweed, cocklebur, English plantain, and Alternaria. He currently receives 0.6650mL of the RED vial (1/100). Immunotherapy script #2 contains grass mix, elm mix, and cat. He currently receives 0.1450mL of the RED vial (1/100). He started shots January of 2018 and reached maintenance in May of 2018. He feels quite good with this regimen, and continues on his 2.5mg  of Xyzal (one half of a tablet) daily in addition to Astepro as needed.  Otherwise, there have been no changes to his past medical history, surgical history, family history, or social history.     Review of Systems: a 14-point review of systems is pertinent for what is mentioned in HPI.  Otherwise, all other systems were negative. Constitutional: negative other than that listed in the HPI Eyes: negative other than that listed in the HPI Ears, nose, mouth, throat, and face: negative other than that listed  in the HPI Respiratory: negative other than that listed in the HPI Cardiovascular: negative other than that listed in the HPI Gastrointestinal: negative other than that listed in the HPI Genitourinary: negative other than that listed in the HPI Integument: negative other than that listed in the HPI Hematologic: negative other than that listed in the HPI Musculoskeletal: negative  other than that listed in the HPI Neurological: negative other than that listed in the HPI Allergy/Immunologic: negative other than that listed in the HPI    Objective:   Blood pressure 120/72, pulse 71, temperature 98.6 F (37 C), temperature source Oral, resp. rate 19, SpO2 94 %. There is no height or weight on file to calculate BMI.   Physical Exam:  General: Alert, interactive, in no acute distress. Pleasant and talkative.  Eyes: No conjunctival injection present on the right, No conjunctival injection present on the left, PERRL bilaterally, No discharge on the right, No discharge on the left and No Horner-Trantas dots present Ears: Right TM pearly gray with normal light reflex, Left TM pearly gray with normal light reflex, Right TM intact without perforation and Left TM intact without perforation.  Nose/Throat: External nose within normal limits and septum midline, turbinates edematous and pale without discharge, post-pharynx mildly erythematous without cobblestoning in the posterior oropharynx. Tonsils 2+ without exudates Neck: Supple without thyromegaly. Lungs: Clear to auscultation without wheezing, rhonchi or rales. No increased work of breathing. CV: Normal S1/S2, no murmurs. Capillary refill <2 seconds.  Skin: Warm and dry, without lesions or rashes. Neuro:   Grossly intact. No focal deficits appreciated. Responsive to questions.   Diagnostic studies: none      Malachi BondsJoel Marliss Buttacavoli, MD [facility] [place]

## 2017-07-14 NOTE — Patient Instructions (Addendum)
1. Chronic rhinitis (grasses, trees, molds, dust mite, cat) - Continue allergy shots at the same Schedule. - Continue with Xyzal daily. - Continue with Astepro as needed during the worst times of the year.  2. Return in about 1 year (around 07/14/2018).  Please inform us of any Emergency Department visits, hospitalizations, or changes in symptoms. Call us before going to the ED for breathing or allergy symptoms since we might be able to fit you in for a sick visit. Feel free to contact us anytime with any questions, problems, or concerns.  It was a pleasure to see you again today! Have a great summer!   Websites that have reliable patient information: 1. American Academy of Asthma, Allergy, and Immunology: www.aaaai.org 2. Food Allergy Research and Education (FARE): foodallergy.org 3. Mothers of Asthmatics: http://www.asthmacommunitynetwork.org 4. American College of Allergy, Asthma, and Immunology: www.acaai.org

## 2017-07-20 ENCOUNTER — Ambulatory Visit (INDEPENDENT_AMBULATORY_CARE_PROVIDER_SITE_OTHER): Payer: Managed Care, Other (non HMO)

## 2017-07-20 ENCOUNTER — Telehealth: Payer: Self-pay

## 2017-07-20 DIAGNOSIS — J309 Allergic rhinitis, unspecified: Secondary | ICD-10-CM | POA: Diagnosis not present

## 2017-07-20 NOTE — Telephone Encounter (Signed)
Patient is starting new red vial with his immunotherapy. He is on schedule b and at 0.1 cc. He has 4 weeks on build up. Would like to know if he can do every 2 weeks when he reaches his 0.5 cc dose.

## 2017-07-20 NOTE — Telephone Encounter (Signed)
That is perfectly fine with me.  Thanks, Malachi BondsJoel Izear Pine, MD [facility] [place]

## 2017-07-21 NOTE — Telephone Encounter (Signed)
I have noted this information on immunotherapy flowsheet and flagged it for future injections.

## 2017-07-27 ENCOUNTER — Ambulatory Visit (INDEPENDENT_AMBULATORY_CARE_PROVIDER_SITE_OTHER): Payer: Managed Care, Other (non HMO) | Admitting: *Deleted

## 2017-07-27 DIAGNOSIS — J309 Allergic rhinitis, unspecified: Secondary | ICD-10-CM

## 2017-08-03 ENCOUNTER — Ambulatory Visit (INDEPENDENT_AMBULATORY_CARE_PROVIDER_SITE_OTHER): Payer: Managed Care, Other (non HMO) | Admitting: *Deleted

## 2017-08-03 DIAGNOSIS — J309 Allergic rhinitis, unspecified: Secondary | ICD-10-CM

## 2017-08-10 ENCOUNTER — Ambulatory Visit (INDEPENDENT_AMBULATORY_CARE_PROVIDER_SITE_OTHER): Payer: Managed Care, Other (non HMO) | Admitting: *Deleted

## 2017-08-10 DIAGNOSIS — J309 Allergic rhinitis, unspecified: Secondary | ICD-10-CM | POA: Diagnosis not present

## 2017-08-17 ENCOUNTER — Ambulatory Visit (INDEPENDENT_AMBULATORY_CARE_PROVIDER_SITE_OTHER): Payer: Managed Care, Other (non HMO) | Admitting: *Deleted

## 2017-08-17 DIAGNOSIS — J309 Allergic rhinitis, unspecified: Secondary | ICD-10-CM | POA: Diagnosis not present

## 2017-08-30 ENCOUNTER — Ambulatory Visit (INDEPENDENT_AMBULATORY_CARE_PROVIDER_SITE_OTHER): Payer: Managed Care, Other (non HMO)

## 2017-08-30 DIAGNOSIS — J309 Allergic rhinitis, unspecified: Secondary | ICD-10-CM | POA: Diagnosis not present

## 2017-09-14 ENCOUNTER — Ambulatory Visit (INDEPENDENT_AMBULATORY_CARE_PROVIDER_SITE_OTHER): Payer: Managed Care, Other (non HMO) | Admitting: *Deleted

## 2017-09-14 DIAGNOSIS — J309 Allergic rhinitis, unspecified: Secondary | ICD-10-CM | POA: Diagnosis not present

## 2017-09-22 NOTE — Progress Notes (Signed)
VIALS EXP 09-23-18

## 2017-09-24 DIAGNOSIS — J301 Allergic rhinitis due to pollen: Secondary | ICD-10-CM | POA: Diagnosis not present

## 2017-09-28 ENCOUNTER — Ambulatory Visit (INDEPENDENT_AMBULATORY_CARE_PROVIDER_SITE_OTHER): Payer: Managed Care, Other (non HMO)

## 2017-09-28 DIAGNOSIS — J309 Allergic rhinitis, unspecified: Secondary | ICD-10-CM | POA: Diagnosis not present

## 2017-10-12 ENCOUNTER — Ambulatory Visit (INDEPENDENT_AMBULATORY_CARE_PROVIDER_SITE_OTHER): Payer: Managed Care, Other (non HMO) | Admitting: *Deleted

## 2017-10-12 DIAGNOSIS — J309 Allergic rhinitis, unspecified: Secondary | ICD-10-CM

## 2017-10-26 ENCOUNTER — Ambulatory Visit (INDEPENDENT_AMBULATORY_CARE_PROVIDER_SITE_OTHER): Payer: Managed Care, Other (non HMO)

## 2017-10-26 DIAGNOSIS — J309 Allergic rhinitis, unspecified: Secondary | ICD-10-CM

## 2017-11-08 ENCOUNTER — Ambulatory Visit (INDEPENDENT_AMBULATORY_CARE_PROVIDER_SITE_OTHER): Payer: Managed Care, Other (non HMO) | Admitting: *Deleted

## 2017-11-08 DIAGNOSIS — J309 Allergic rhinitis, unspecified: Secondary | ICD-10-CM

## 2017-11-18 ENCOUNTER — Ambulatory Visit (INDEPENDENT_AMBULATORY_CARE_PROVIDER_SITE_OTHER): Payer: Managed Care, Other (non HMO) | Admitting: *Deleted

## 2017-11-18 DIAGNOSIS — J309 Allergic rhinitis, unspecified: Secondary | ICD-10-CM | POA: Diagnosis not present

## 2017-11-23 ENCOUNTER — Ambulatory Visit (INDEPENDENT_AMBULATORY_CARE_PROVIDER_SITE_OTHER): Payer: Managed Care, Other (non HMO) | Admitting: *Deleted

## 2017-11-23 DIAGNOSIS — J309 Allergic rhinitis, unspecified: Secondary | ICD-10-CM | POA: Diagnosis not present

## 2017-12-02 ENCOUNTER — Ambulatory Visit (INDEPENDENT_AMBULATORY_CARE_PROVIDER_SITE_OTHER): Payer: Managed Care, Other (non HMO) | Admitting: *Deleted

## 2017-12-02 DIAGNOSIS — J309 Allergic rhinitis, unspecified: Secondary | ICD-10-CM | POA: Diagnosis not present

## 2017-12-07 ENCOUNTER — Ambulatory Visit (INDEPENDENT_AMBULATORY_CARE_PROVIDER_SITE_OTHER): Payer: Managed Care, Other (non HMO) | Admitting: *Deleted

## 2017-12-07 DIAGNOSIS — J309 Allergic rhinitis, unspecified: Secondary | ICD-10-CM

## 2017-12-23 ENCOUNTER — Ambulatory Visit (INDEPENDENT_AMBULATORY_CARE_PROVIDER_SITE_OTHER): Payer: Managed Care, Other (non HMO) | Admitting: *Deleted

## 2017-12-23 DIAGNOSIS — J309 Allergic rhinitis, unspecified: Secondary | ICD-10-CM | POA: Diagnosis not present

## 2018-01-04 ENCOUNTER — Ambulatory Visit (INDEPENDENT_AMBULATORY_CARE_PROVIDER_SITE_OTHER): Payer: Managed Care, Other (non HMO) | Admitting: *Deleted

## 2018-01-04 DIAGNOSIS — J309 Allergic rhinitis, unspecified: Secondary | ICD-10-CM | POA: Diagnosis not present

## 2018-01-13 DIAGNOSIS — J301 Allergic rhinitis due to pollen: Secondary | ICD-10-CM | POA: Diagnosis not present

## 2018-01-18 ENCOUNTER — Ambulatory Visit (INDEPENDENT_AMBULATORY_CARE_PROVIDER_SITE_OTHER): Payer: Managed Care, Other (non HMO)

## 2018-01-18 DIAGNOSIS — J309 Allergic rhinitis, unspecified: Secondary | ICD-10-CM

## 2018-02-01 ENCOUNTER — Ambulatory Visit (INDEPENDENT_AMBULATORY_CARE_PROVIDER_SITE_OTHER): Payer: Managed Care, Other (non HMO) | Admitting: *Deleted

## 2018-02-01 DIAGNOSIS — J309 Allergic rhinitis, unspecified: Secondary | ICD-10-CM | POA: Diagnosis not present

## 2018-02-15 ENCOUNTER — Ambulatory Visit (INDEPENDENT_AMBULATORY_CARE_PROVIDER_SITE_OTHER): Payer: Managed Care, Other (non HMO) | Admitting: *Deleted

## 2018-02-15 DIAGNOSIS — J309 Allergic rhinitis, unspecified: Secondary | ICD-10-CM

## 2018-03-01 ENCOUNTER — Ambulatory Visit (INDEPENDENT_AMBULATORY_CARE_PROVIDER_SITE_OTHER): Payer: Managed Care, Other (non HMO) | Admitting: *Deleted

## 2018-03-01 DIAGNOSIS — J309 Allergic rhinitis, unspecified: Secondary | ICD-10-CM

## 2018-03-08 ENCOUNTER — Ambulatory Visit (INDEPENDENT_AMBULATORY_CARE_PROVIDER_SITE_OTHER): Payer: Managed Care, Other (non HMO) | Admitting: *Deleted

## 2018-03-08 DIAGNOSIS — J309 Allergic rhinitis, unspecified: Secondary | ICD-10-CM

## 2018-03-16 ENCOUNTER — Ambulatory Visit (INDEPENDENT_AMBULATORY_CARE_PROVIDER_SITE_OTHER): Payer: Managed Care, Other (non HMO) | Admitting: *Deleted

## 2018-03-16 DIAGNOSIS — J309 Allergic rhinitis, unspecified: Secondary | ICD-10-CM | POA: Diagnosis not present

## 2018-03-22 ENCOUNTER — Ambulatory Visit (INDEPENDENT_AMBULATORY_CARE_PROVIDER_SITE_OTHER): Payer: Managed Care, Other (non HMO) | Admitting: *Deleted

## 2018-03-22 DIAGNOSIS — J309 Allergic rhinitis, unspecified: Secondary | ICD-10-CM | POA: Diagnosis not present

## 2018-03-29 ENCOUNTER — Ambulatory Visit (INDEPENDENT_AMBULATORY_CARE_PROVIDER_SITE_OTHER): Payer: Managed Care, Other (non HMO) | Admitting: *Deleted

## 2018-03-29 DIAGNOSIS — J309 Allergic rhinitis, unspecified: Secondary | ICD-10-CM | POA: Diagnosis not present

## 2018-04-12 ENCOUNTER — Ambulatory Visit (INDEPENDENT_AMBULATORY_CARE_PROVIDER_SITE_OTHER): Payer: Managed Care, Other (non HMO) | Admitting: *Deleted

## 2018-04-12 DIAGNOSIS — J309 Allergic rhinitis, unspecified: Secondary | ICD-10-CM

## 2018-04-26 ENCOUNTER — Ambulatory Visit (INDEPENDENT_AMBULATORY_CARE_PROVIDER_SITE_OTHER): Payer: Managed Care, Other (non HMO) | Admitting: *Deleted

## 2018-04-26 DIAGNOSIS — J309 Allergic rhinitis, unspecified: Secondary | ICD-10-CM | POA: Diagnosis not present

## 2018-05-10 ENCOUNTER — Ambulatory Visit (INDEPENDENT_AMBULATORY_CARE_PROVIDER_SITE_OTHER): Payer: Managed Care, Other (non HMO) | Admitting: *Deleted

## 2018-05-10 DIAGNOSIS — J309 Allergic rhinitis, unspecified: Secondary | ICD-10-CM | POA: Diagnosis not present

## 2018-05-11 DIAGNOSIS — J301 Allergic rhinitis due to pollen: Secondary | ICD-10-CM | POA: Diagnosis not present

## 2018-05-11 NOTE — Progress Notes (Signed)
VIALS EXP 05-12-19 

## 2018-05-24 ENCOUNTER — Ambulatory Visit (INDEPENDENT_AMBULATORY_CARE_PROVIDER_SITE_OTHER): Payer: Managed Care, Other (non HMO) | Admitting: *Deleted

## 2018-05-24 DIAGNOSIS — J309 Allergic rhinitis, unspecified: Secondary | ICD-10-CM

## 2018-06-06 ENCOUNTER — Ambulatory Visit (INDEPENDENT_AMBULATORY_CARE_PROVIDER_SITE_OTHER): Payer: Managed Care, Other (non HMO)

## 2018-06-06 DIAGNOSIS — J309 Allergic rhinitis, unspecified: Secondary | ICD-10-CM

## 2018-06-14 ENCOUNTER — Ambulatory Visit (INDEPENDENT_AMBULATORY_CARE_PROVIDER_SITE_OTHER): Payer: Managed Care, Other (non HMO) | Admitting: *Deleted

## 2018-06-14 DIAGNOSIS — J309 Allergic rhinitis, unspecified: Secondary | ICD-10-CM

## 2018-06-21 ENCOUNTER — Ambulatory Visit (INDEPENDENT_AMBULATORY_CARE_PROVIDER_SITE_OTHER): Payer: Managed Care, Other (non HMO) | Admitting: *Deleted

## 2018-06-21 DIAGNOSIS — J309 Allergic rhinitis, unspecified: Secondary | ICD-10-CM | POA: Diagnosis not present

## 2018-06-28 ENCOUNTER — Ambulatory Visit (INDEPENDENT_AMBULATORY_CARE_PROVIDER_SITE_OTHER): Payer: Managed Care, Other (non HMO) | Admitting: *Deleted

## 2018-06-28 DIAGNOSIS — J309 Allergic rhinitis, unspecified: Secondary | ICD-10-CM

## 2018-07-05 ENCOUNTER — Ambulatory Visit (INDEPENDENT_AMBULATORY_CARE_PROVIDER_SITE_OTHER): Payer: Managed Care, Other (non HMO) | Admitting: *Deleted

## 2018-07-05 DIAGNOSIS — J309 Allergic rhinitis, unspecified: Secondary | ICD-10-CM | POA: Diagnosis not present

## 2018-07-14 ENCOUNTER — Ambulatory Visit (INDEPENDENT_AMBULATORY_CARE_PROVIDER_SITE_OTHER): Payer: Managed Care, Other (non HMO) | Admitting: Allergy & Immunology

## 2018-07-14 ENCOUNTER — Encounter: Payer: Self-pay | Admitting: Allergy & Immunology

## 2018-07-14 VITALS — BP 130/90 | HR 66 | Temp 98.3°F | Resp 15 | Ht 64.0 in | Wt 236.0 lb

## 2018-07-14 DIAGNOSIS — J302 Other seasonal allergic rhinitis: Secondary | ICD-10-CM | POA: Diagnosis not present

## 2018-07-14 DIAGNOSIS — J3089 Other allergic rhinitis: Secondary | ICD-10-CM | POA: Diagnosis not present

## 2018-07-14 DIAGNOSIS — J0100 Acute maxillary sinusitis, unspecified: Secondary | ICD-10-CM

## 2018-07-14 MED ORDER — AMOXICILLIN-POT CLAVULANATE 875-125 MG PO TABS: 1.0000 | ORAL_TABLET | Freq: Two times a day (BID) | ORAL | 0 refills | Status: AC

## 2018-07-14 NOTE — Progress Notes (Signed)
FOLLOW UP  Date of Service/Encounter:  07/14/18   Assessment:   Seasonal and perennial allergic rhinitis  Acute non-recurrent maxillary sinusitis - triggered initially by allergen exposure (cat)  Plan/Recommendations:   1. Chronic rhinitis (grasses, trees, molds, dust mite, cat) - with current exacerbation of cats - Continue allergy shots at the same Schedule. - I did review his allergen immunotherapy prescription, and he is already getting higher than normal cat dander, which I ordered due to the presence of the cat in the home).  - Therefore there are no changes I could make to increase the strength of the protection.  - Ralph Ware might just need more time for his immune system to change.  - Continue with Xyzal daily. - Continue with Astepro as needed during the worst times of the year. - In the future, double up on the Xyzal before your daughter arrives.  - You can also use HEPA filters, especially in sleeping spaces.   2. Acute sinusitis  - With your current symptoms and time course, antibiotics are needed: Augmentin 875mg  twice daily for 10 days - Start the prednisone pack provided.  - Add on nasal saline spray (i.e., Simply Saline) or nasal saline lavage (i.e., NeilMed) as needed prior to medicated nasal sprays.  3. Return in about 1 year (around 07/15/2019).  Subjective:   Ralph Ware is a 62 y.o. male presenting today for follow up of  Chief Complaint  Patient presents with  . Follow-up    Ralph Ware has a history of the following: Patient Active Problem List   Diagnosis Date Noted  . Seasonal and perennial allergic rhinitis 07/14/2017  . Adverse food reaction 01/18/2017  . Allergic rhinitis due to pollen 02/24/2011  . Obstructive sleep apnea 08/30/2009  . RHINOSINUSITIS, ACUTE 01/03/2009  . HYPERLIPIDEMIA 06/28/2008  . HYPERTENSION 06/28/2008  . Chronic nonseasonal allergic rhinitis due to fungal spores 10/12/2007    History obtained from: chart review and  patient.  Ralph Ware Primary Care Provider is Marden Noble, MD.     Ralph Ware is a 62 y.o. male presenting for a follow up visit.  He was last seen in July 2018.  At that time, we continued allergy shots at the same schedule.  We also continued Xyzal daily as well as Astepro during the worst times of the year.  Since the last visit, she has mostly done well. He was recently exposed to his daughter's cat over the Fourth of July weekend. He was tolerating the cat last year for a few months when her daughter was living with them in the fall of 2018. His wife also had a cat who passed away in 03-26-2018. However, when his daughter showed up in early July with the cat, he immediately developed worsening symptoms. He did take double antihistamines once he started having symptoms. The cat had free roaming rights around the house, including bedrooms.   Following his daughter's departure, he continued to have symptoms. They did improve for a period of days but then developed discolored thick mucous and sinus pressure. He has been living with alternating DayQuil and NyQuil with some improvement in symptoms. He has not required any antibiotics in quite some time.   Otherwise, there have been no changes to his past medical history, surgical history, family history, or social history.    Review of Systems: a 14-point review of systems is pertinent for what is mentioned in HPI.  Otherwise, all other systems were negative. Constitutional: negative other than that listed  in the HPI Eyes: negative other than that listed in the HPI Ears, nose, mouth, throat, and face: negative other than that listed in the HPI Respiratory: negative other than that listed in the HPI Cardiovascular: negative other than that listed in the HPI Gastrointestinal: negative other than that listed in the HPI Genitourinary: negative other than that listed in the HPI Integument: negative other than that listed in the HPI Hematologic:  negative other than that listed in the HPI Musculoskeletal: negative other than that listed in the HPI Neurological: negative other than that listed in the HPI Allergy/Immunologic: negative other than that listed in the HPI    Objective:   Blood pressure 130/90, pulse 66, temperature 98.3 F (36.8 C), resp. rate 15, height 5\' 4"  (1.626 m), weight 236 lb (107 kg), SpO2 95 %. Body mass index is 40.51 kg/m.   Physical Exam:  General: Alert, interactive, in no acute distress. Pleasant.  Eyes: No conjunctival injection bilaterally, no discharge on the right, no discharge on the left and no Horner-Trantas dots present. PERRL bilaterally. EOMI without pain. No photophobia.  Ears: Right TM pearly gray with normal light reflex, Left TM pearly gray with normal light reflex, Right TM intact without perforation and Left TM intact without perforation.  Nose/Throat: External nose within normal limits, nasal crease present and septum midline. Turbinates markedly edematous and pale with thick discharge. Posterior oropharynx moderately erythematous with cobblestoning in the posterior oropharynx. Tonsils 2+ without exudates.  Tongue without thrush. Bilateral maxillary sinus tenderness. Lungs: Clear to auscultation without wheezing, rhonchi or rales. No increased work of breathing. CV: Normal S1/S2. No murmurs. Capillary refill <2 seconds.  Skin: Warm and dry, without lesions or rashes. Neuro:   Grossly intact. No focal deficits appreciated. Responsive to questions.  Diagnostic studies: none     Malachi BondsJoel Gallagher, MD  [facility] [place]

## 2018-07-14 NOTE — Patient Instructions (Addendum)
1. Chronic rhinitis (grasses, trees, molds, dust mite, cat) - with current exacerbation of cats - Continue allergy shots at the same Schedule. - Continue with Xyzal daily. - Continue with Astepro as needed during the worst times of the year. - In the future, double up on the Xyzal before your daughter arrives.  - You can also use HEPA filters, especially in sleeping spaces.   2. Acute sinusitis  - With your current symptoms and time course, antibiotics are needed: Augmentin 875mg  twice daily for 10 days - Start the prednisone pack provided.  - Add on nasal saline spray (i.e., Simply Saline) or nasal saline lavage (i.e., NeilMed) as needed prior to medicated nasal sprays.  3. Return in about 1 year (around 07/15/2019).  Please inform us of any Emergency Department visits, hospitalizations, or changes in symptoms. Call us before going to the ED for breathing or allergy symptoms since we might be able to fit you in for a sick visit. Feel free to contact us anytime with any questions, problems, or concerns.  It was a pleasure to see you again today!   Websites that have reliable patient information: 1. American Academy of Asthma, Allergy, and Immunology: www.aaaai.org 2. Food Allergy Research and Education (FARE): foodallergy.org 3. Mothers of Asthmatics: http://www.asthmacommunitynetwork.org 4. American College of Allergy, Asthma, and Immunology: www.acaai.org

## 2018-07-19 ENCOUNTER — Ambulatory Visit (INDEPENDENT_AMBULATORY_CARE_PROVIDER_SITE_OTHER): Payer: Managed Care, Other (non HMO) | Admitting: *Deleted

## 2018-07-19 DIAGNOSIS — J309 Allergic rhinitis, unspecified: Secondary | ICD-10-CM

## 2018-08-02 ENCOUNTER — Ambulatory Visit (INDEPENDENT_AMBULATORY_CARE_PROVIDER_SITE_OTHER): Payer: Managed Care, Other (non HMO)

## 2018-08-02 DIAGNOSIS — J309 Allergic rhinitis, unspecified: Secondary | ICD-10-CM | POA: Diagnosis not present

## 2018-08-16 ENCOUNTER — Ambulatory Visit (INDEPENDENT_AMBULATORY_CARE_PROVIDER_SITE_OTHER): Payer: Managed Care, Other (non HMO) | Admitting: *Deleted

## 2018-08-16 DIAGNOSIS — J309 Allergic rhinitis, unspecified: Secondary | ICD-10-CM | POA: Diagnosis not present

## 2018-08-30 ENCOUNTER — Ambulatory Visit (INDEPENDENT_AMBULATORY_CARE_PROVIDER_SITE_OTHER): Payer: Managed Care, Other (non HMO) | Admitting: *Deleted

## 2018-08-30 DIAGNOSIS — J309 Allergic rhinitis, unspecified: Secondary | ICD-10-CM

## 2018-08-31 ENCOUNTER — Encounter: Payer: Self-pay | Admitting: *Deleted

## 2018-08-31 NOTE — Progress Notes (Signed)
Vials made. Exp: 09-01-19. hv 

## 2018-09-02 DIAGNOSIS — J301 Allergic rhinitis due to pollen: Secondary | ICD-10-CM | POA: Diagnosis not present

## 2018-09-13 ENCOUNTER — Ambulatory Visit (INDEPENDENT_AMBULATORY_CARE_PROVIDER_SITE_OTHER): Payer: Managed Care, Other (non HMO) | Admitting: *Deleted

## 2018-09-13 DIAGNOSIS — J309 Allergic rhinitis, unspecified: Secondary | ICD-10-CM | POA: Diagnosis not present

## 2018-09-27 ENCOUNTER — Ambulatory Visit (INDEPENDENT_AMBULATORY_CARE_PROVIDER_SITE_OTHER): Payer: Managed Care, Other (non HMO) | Admitting: *Deleted

## 2018-09-27 DIAGNOSIS — J309 Allergic rhinitis, unspecified: Secondary | ICD-10-CM | POA: Diagnosis not present

## 2018-10-04 ENCOUNTER — Ambulatory Visit (INDEPENDENT_AMBULATORY_CARE_PROVIDER_SITE_OTHER): Payer: Managed Care, Other (non HMO) | Admitting: *Deleted

## 2018-10-04 DIAGNOSIS — J309 Allergic rhinitis, unspecified: Secondary | ICD-10-CM | POA: Diagnosis not present

## 2018-10-11 ENCOUNTER — Ambulatory Visit (INDEPENDENT_AMBULATORY_CARE_PROVIDER_SITE_OTHER): Payer: Managed Care, Other (non HMO) | Admitting: *Deleted

## 2018-10-11 DIAGNOSIS — J309 Allergic rhinitis, unspecified: Secondary | ICD-10-CM

## 2018-10-17 ENCOUNTER — Ambulatory Visit (INDEPENDENT_AMBULATORY_CARE_PROVIDER_SITE_OTHER): Payer: Managed Care, Other (non HMO)

## 2018-10-17 DIAGNOSIS — J309 Allergic rhinitis, unspecified: Secondary | ICD-10-CM | POA: Diagnosis not present

## 2018-10-27 ENCOUNTER — Ambulatory Visit (INDEPENDENT_AMBULATORY_CARE_PROVIDER_SITE_OTHER): Payer: Managed Care, Other (non HMO) | Admitting: *Deleted

## 2018-10-27 DIAGNOSIS — J309 Allergic rhinitis, unspecified: Secondary | ICD-10-CM

## 2018-11-08 ENCOUNTER — Ambulatory Visit (INDEPENDENT_AMBULATORY_CARE_PROVIDER_SITE_OTHER): Payer: Managed Care, Other (non HMO)

## 2018-11-08 DIAGNOSIS — J309 Allergic rhinitis, unspecified: Secondary | ICD-10-CM | POA: Diagnosis not present

## 2018-11-22 ENCOUNTER — Ambulatory Visit (INDEPENDENT_AMBULATORY_CARE_PROVIDER_SITE_OTHER): Payer: Managed Care, Other (non HMO) | Admitting: *Deleted

## 2018-11-22 DIAGNOSIS — J309 Allergic rhinitis, unspecified: Secondary | ICD-10-CM

## 2018-12-06 ENCOUNTER — Ambulatory Visit (INDEPENDENT_AMBULATORY_CARE_PROVIDER_SITE_OTHER): Payer: Managed Care, Other (non HMO) | Admitting: *Deleted

## 2018-12-06 DIAGNOSIS — J309 Allergic rhinitis, unspecified: Secondary | ICD-10-CM | POA: Diagnosis not present

## 2018-12-22 ENCOUNTER — Ambulatory Visit (INDEPENDENT_AMBULATORY_CARE_PROVIDER_SITE_OTHER): Payer: Managed Care, Other (non HMO) | Admitting: *Deleted

## 2018-12-22 DIAGNOSIS — J309 Allergic rhinitis, unspecified: Secondary | ICD-10-CM

## 2019-01-03 ENCOUNTER — Ambulatory Visit (INDEPENDENT_AMBULATORY_CARE_PROVIDER_SITE_OTHER): Payer: Managed Care, Other (non HMO) | Admitting: *Deleted

## 2019-01-03 DIAGNOSIS — J309 Allergic rhinitis, unspecified: Secondary | ICD-10-CM

## 2019-01-04 NOTE — Progress Notes (Signed)
Vials exp 01-05-2020

## 2019-01-05 DIAGNOSIS — J301 Allergic rhinitis due to pollen: Secondary | ICD-10-CM

## 2019-01-17 ENCOUNTER — Ambulatory Visit (INDEPENDENT_AMBULATORY_CARE_PROVIDER_SITE_OTHER): Payer: Managed Care, Other (non HMO) | Admitting: *Deleted

## 2019-01-17 DIAGNOSIS — J309 Allergic rhinitis, unspecified: Secondary | ICD-10-CM

## 2019-01-24 ENCOUNTER — Ambulatory Visit (INDEPENDENT_AMBULATORY_CARE_PROVIDER_SITE_OTHER): Payer: Managed Care, Other (non HMO)

## 2019-01-24 DIAGNOSIS — J309 Allergic rhinitis, unspecified: Secondary | ICD-10-CM

## 2019-01-31 ENCOUNTER — Ambulatory Visit (INDEPENDENT_AMBULATORY_CARE_PROVIDER_SITE_OTHER): Payer: Managed Care, Other (non HMO) | Admitting: *Deleted

## 2019-01-31 DIAGNOSIS — J309 Allergic rhinitis, unspecified: Secondary | ICD-10-CM | POA: Diagnosis not present

## 2019-02-14 ENCOUNTER — Ambulatory Visit (INDEPENDENT_AMBULATORY_CARE_PROVIDER_SITE_OTHER): Payer: Managed Care, Other (non HMO) | Admitting: *Deleted

## 2019-02-14 DIAGNOSIS — J309 Allergic rhinitis, unspecified: Secondary | ICD-10-CM

## 2019-02-21 ENCOUNTER — Ambulatory Visit (INDEPENDENT_AMBULATORY_CARE_PROVIDER_SITE_OTHER): Payer: Managed Care, Other (non HMO) | Admitting: *Deleted

## 2019-02-21 DIAGNOSIS — J309 Allergic rhinitis, unspecified: Secondary | ICD-10-CM

## 2019-08-10 ENCOUNTER — Ambulatory Visit
Admission: RE | Admit: 2019-08-10 | Discharge: 2019-08-10 | Disposition: A | Discharge status: Home / Self Care | Payer: Managed Care, Other (non HMO) | Source: Ambulatory Visit | Attending: Internal Medicine | Admitting: Internal Medicine

## 2019-08-10 ENCOUNTER — Other Ambulatory Visit: Payer: Self-pay | Admitting: Internal Medicine

## 2019-08-10 DIAGNOSIS — M5410 Radiculopathy, site unspecified: Secondary | ICD-10-CM

## 2019-10-26 ENCOUNTER — Telehealth: Payer: Self-pay | Admitting: Diagnostic Neuroimaging

## 2019-10-26 NOTE — Telephone Encounter (Signed)
I called patient and LVM regarding rescheduling 12/29 appointment d/t MD out. Patient can be rescheduled to 12/1 (reschedule day) if there are spots available when patient calls back.

## 2019-11-16 ENCOUNTER — Inpatient Hospital Stay
Admission: EM | Admit: 2019-11-16 | Discharge: 2019-11-20 | DRG: 177 | Disposition: A | Discharge status: Home / Self Care | Payer: Managed Care, Other (non HMO) | Attending: Internal Medicine | Admitting: Internal Medicine

## 2019-11-16 ENCOUNTER — Emergency Department: Payer: Managed Care, Other (non HMO)

## 2019-11-16 ENCOUNTER — Other Ambulatory Visit: Payer: Self-pay

## 2019-11-16 ENCOUNTER — Encounter: Payer: Self-pay | Admitting: Emergency Medicine

## 2019-11-16 DIAGNOSIS — J3089 Other allergic rhinitis: Secondary | ICD-10-CM | POA: Diagnosis present

## 2019-11-16 DIAGNOSIS — U071 COVID-19: Principal | ICD-10-CM | POA: Diagnosis present

## 2019-11-16 DIAGNOSIS — G4733 Obstructive sleep apnea (adult) (pediatric): Secondary | ICD-10-CM | POA: Diagnosis present

## 2019-11-16 DIAGNOSIS — J302 Other seasonal allergic rhinitis: Secondary | ICD-10-CM | POA: Diagnosis not present

## 2019-11-16 DIAGNOSIS — J1289 Other viral pneumonia: Secondary | ICD-10-CM | POA: Diagnosis present

## 2019-11-16 DIAGNOSIS — I10 Essential (primary) hypertension: Secondary | ICD-10-CM | POA: Diagnosis present

## 2019-11-16 DIAGNOSIS — Z7982 Long term (current) use of aspirin: Secondary | ICD-10-CM

## 2019-11-16 DIAGNOSIS — R05 Cough: Secondary | ICD-10-CM

## 2019-11-16 DIAGNOSIS — E785 Hyperlipidemia, unspecified: Secondary | ICD-10-CM | POA: Diagnosis present

## 2019-11-16 DIAGNOSIS — R7401 Elevation of levels of liver transaminase levels: Secondary | ICD-10-CM | POA: Diagnosis present

## 2019-11-16 DIAGNOSIS — R0602 Shortness of breath: Secondary | ICD-10-CM | POA: Diagnosis present

## 2019-11-16 DIAGNOSIS — R0902 Hypoxemia: Secondary | ICD-10-CM | POA: Diagnosis not present

## 2019-11-16 DIAGNOSIS — E876 Hypokalemia: Secondary | ICD-10-CM | POA: Diagnosis present

## 2019-11-16 DIAGNOSIS — R059 Cough, unspecified: Secondary | ICD-10-CM

## 2019-11-16 DIAGNOSIS — J1282 Pneumonia due to coronavirus disease 2019: Secondary | ICD-10-CM

## 2019-11-16 DIAGNOSIS — J9601 Acute respiratory failure with hypoxia: Secondary | ICD-10-CM | POA: Diagnosis present

## 2019-11-16 LAB — CBC WITH DIFFERENTIAL/PLATELET
Abs Immature Granulocytes: 0.04 10*3/uL (ref 0.00–0.07)
Basophils Absolute: 0 10*3/uL (ref 0.0–0.1)
Basophils Relative: 0 %
Eosinophils Absolute: 0 10*3/uL (ref 0.0–0.5)
Eosinophils Relative: 0 %
HCT: 43.7 % (ref 39.0–52.0)
Hemoglobin: 15 g/dL (ref 13.0–17.0)
Immature Granulocytes: 1 %
Lymphocytes Relative: 13 %
Lymphs Abs: 1 10*3/uL (ref 0.7–4.0)
MCH: 29.1 pg (ref 26.0–34.0)
MCHC: 34.3 g/dL (ref 30.0–36.0)
MCV: 84.7 fL (ref 80.0–100.0)
Monocytes Absolute: 0.5 10*3/uL (ref 0.1–1.0)
Monocytes Relative: 7 %
Neutro Abs: 5.9 10*3/uL (ref 1.7–7.7)
Neutrophils Relative %: 79 %
Platelets: 161 10*3/uL (ref 150–400)
RBC: 5.16 MIL/uL (ref 4.22–5.81)
RDW: 12.5 % (ref 11.5–15.5)
WBC: 7.4 10*3/uL (ref 4.0–10.5)
nRBC: 0 % (ref 0.0–0.2)

## 2019-11-16 LAB — LACTATE DEHYDROGENASE: LDH: 351 U/L — ABNORMAL HIGH (ref 98–192)

## 2019-11-16 LAB — COMPREHENSIVE METABOLIC PANEL
ALT: 75 U/L — ABNORMAL HIGH (ref 0–44)
AST: 90 U/L — ABNORMAL HIGH (ref 15–41)
Albumin: 3.4 g/dL — ABNORMAL LOW (ref 3.5–5.0)
Alkaline Phosphatase: 47 U/L (ref 38–126)
Anion gap: 12 (ref 5–15)
BUN: 21 mg/dL (ref 8–23)
CO2: 25 mmol/L (ref 22–32)
Calcium: 8.2 mg/dL — ABNORMAL LOW (ref 8.9–10.3)
Chloride: 101 mmol/L (ref 98–111)
Creatinine, Ser: 0.71 mg/dL (ref 0.61–1.24)
GFR calc Af Amer: >60 mL/min (ref >60)
GFR calc non Af Amer: >60 mL/min (ref >60)
Glucose, Bld: 112 mg/dL — ABNORMAL HIGH (ref 70–99)
Potassium: 3 mmol/L — ABNORMAL LOW (ref 3.5–5.1)
Sodium: 138 mmol/L (ref 135–145)
Total Bilirubin: 1.1 mg/dL (ref 0.3–1.2)
Total Protein: 7.1 g/dL (ref 6.5–8.1)

## 2019-11-16 LAB — PROTIME-INR
INR: 1.5 — ABNORMAL HIGH (ref 0.8–1.2)
Prothrombin Time: 17.8 seconds — ABNORMAL HIGH (ref 11.4–15.2)

## 2019-11-16 LAB — FERRITIN: Ferritin: 828 ng/mL — ABNORMAL HIGH (ref 24–336)

## 2019-11-16 LAB — FIBRIN DERIVATIVES D-DIMER (ARMC ONLY): Fibrin derivatives D-dimer (ARMC): 952.06 ng/mL (FEU) — ABNORMAL HIGH (ref 0.00–499.00)

## 2019-11-16 MED ORDER — SODIUM CHLORIDE 0.9 % IV SOLN
100.0000 mg | INTRAVENOUS | Status: DC
  Filled 2019-11-16: qty 20

## 2019-11-16 MED ORDER — DEXAMETHASONE SODIUM PHOSPHATE 10 MG/ML IJ SOLN
6.0000 mg | Freq: Once | INTRAMUSCULAR | Status: AC
  Administered 2019-11-16: 6 mg via INTRAVENOUS
  Filled 2019-11-16: qty 1

## 2019-11-16 MED ORDER — POTASSIUM CHLORIDE CRYS ER 20 MEQ PO TBCR
40.0000 meq | EXTENDED_RELEASE_TABLET | Freq: Once | ORAL | Status: AC
  Administered 2019-11-16: 40 meq via ORAL
  Filled 2019-11-16: qty 2

## 2019-11-16 MED ORDER — SODIUM CHLORIDE 0.9 % IV SOLN
200.0000 mg | Freq: Once | INTRAVENOUS | Status: AC
  Administered 2019-11-17: 200 mg via INTRAVENOUS
  Filled 2019-11-16: qty 40

## 2019-11-16 NOTE — ED Triage Notes (Signed)
Patient arrives from home with increased weakness and cough since COVID dx on 11/16. Patient attended a wedding in [place] and tested positive afterwards. Patient's main complaint is shortness of breath with coughing, especially while walking. Patient is not having pain

## 2019-11-16 NOTE — H&P (Addendum)
History and Physical  Patient Name: Ralph Ware     TGG:[id number]    DOB: November 04, 1956    DOA: 11/16/2019 PCP: Marden Noble, MD  Patient coming from: Home  Chief Complaint: Fever and cough, now hypoxia      HPI: Ralph Ware is a 63 y.o. M with hx HTN, allergic rhinitis, and hx SMA dissection in 2012 who presents with 10 days COVID-like symptoms.  Patient was in his usual state of health until about 10 days ago, he was driving back from a wedding in [place] when he started to have cough.  Developed fever, generalized weakness, loss of appetite after that.  Was feeling okay, checking pulse ox at home, until this morning, SpO2 was down to 79-81%, so he came to the ER.  In the ER, temp 90 9.86F, heart rate 70, respirations 27, pulse ox 85% on room air.  Potassium 3.0, creatinine normal.  AST and ALT 90/75, WBC normal, ferritin 800, chest x-ray showed bilateral opacities.    The hospitalist service were asked to evaluate for COVID-19.           ROS: Review of Systems  Constitutional: Positive for chills, fever and malaise/fatigue.  Respiratory: Positive for cough. Negative for sputum production, shortness of breath and wheezing.   Cardiovascular: Negative for chest pain and leg swelling.  Gastrointestinal: Positive for diarrhea.  Neurological: Positive for weakness.  All other systems reviewed and are negative.         Past Medical History:  Diagnosis Date  . Allergic rhinitis   . Hyperlipidemia   . Hypertension   . OSA (obstructive sleep apnea)     Past Surgical History:  Procedure Laterality Date  . NASAL SEPTUM SURGERY     x2    Social History: Patient lives with his wife.  He has been working from home, works in Consulting civil engineer.  The patient walks unassisted.  Nonsmoker.  No Known Allergies  Family history: family history includes Bradycardia in his father; Breast cancer in his mother.  Prior to Admission medications   Medication Sig Start Date End Date Taking?  Authorizing Provider  amLODipine (NORVASC) 2.5 MG tablet TK 1 T PO D 10/19/16   [provider]  Ascorbic Acid (VITAMIN C) 1000 MG tablet Take 1,000 mg by mouth daily.      [provider]  aspirin 81 MG tablet Take 81 mg by mouth daily.      [provider]  AZASITE 1 % ophthalmic solution  06/09/11   [provider]  Azelastine HCl (ASTEPRO) 0.15 % SOLN Place 2 sprays into the nose 2 (two) times daily as needed. 09/11/16   Jetty Duhamel D, MD  Cholecalciferol (VITAMIN D) 1000 UNITS capsule Take 1,000 Units by mouth daily.      [provider]  Coenzyme Q10 (COQ10) 100 MG CAPS Take 1 capsule by mouth daily.      [provider]  diphenhydrAMINE (BENADRYL) 25 MG tablet Take 25 mg by mouth at bedtime as needed.      [provider]  levocetirizine (XYZAL) 5 MG tablet 1 daily for allergy 09/11/15   Jetty Duhamel D, MD  losartan-hydrochlorothiazide (HYZAAR) 100-12.5 MG per tablet Take 1 tablet by mouth daily.      [provider]  Magnesium 250 MG TABS Take 1 tablet by mouth daily.      [provider]  NONFORMULARY OR COMPOUNDED ITEM Allergy Vaccine 1:10 Given at [facility]    [provider]  omeprazole (PRILOSEC) 20 MG capsule TK 1 C PO QD IN THE EVE 06/08/17   [provider]  simvastatin (ZOCOR) 40 MG tablet Take 1 tablet by mouth daily.    [provider]  zinc gluconate 50 MG tablet Take 50 mg by mouth daily.      [provider]       Physical Exam: BP (!) 141/72   Pulse 74   Temp 99.9 F (37.7 C) (Oral)   Resp 12   Ht 5\' 6"  (1.676 m)   Wt 108.9 kg   SpO2 (!) 88%   BMI 38.74 kg/m  General appearance: Well-developed, adult male, alert and in no acute distress.   Eyes: Anicteric, conjunctiva pink, lids and lashes normal. PERRL.    ENT: No nasal deformity, discharge, epistaxis.  Hearing normal. OP moist without lesions.   Neck: No neck masses.  Trachea  midline.  No thyromegaly/tenderness. Lymph: No cervical or supraclavicular lymphadenopathy. Skin: Warm and dry.  No jaundice.  No suspicious rashes or lesions. Cardiac: RRR, nl S1-S2, no murmurs appreciated.  Capillary refill is brisk.  JVP not visible.  No LE edema.  Radial pulses 2+ and symmetric. Respiratory: Normal respiratory rate and rhythm.  CTAB without rales or wheezes. Abdomen: Abdomen soft.  No TTP or guarding. No ascites, distension, hepatosplenomegaly.   MSK: No deformities or effusions of the large joints of the upper or lower extremities bilaterally.  No cyanosis or clubbing. Neuro: Cranial nerves normal.  Sensation intact to light touch. Speech is fluent.  Muscle strength normal.    Psych: Sensorium intact and responding to questions, attention normal.  Behavior appropriate.  Affect normal.  Judgment and insight appear normal.     Labs on Admission:  I have personally reviewed following labs and imaging studies: CBC: Recent Labs  Lab 11/16/19 2044  WBC 7.4  NEUTROABS 5.9  HGB 15.0  HCT 43.7  MCV 84.7  PLT 814   Basic Metabolic Panel: Recent Labs  Lab 11/16/19 2044  NA 138  K 3.0*  CL 101  CO2 25  GLUCOSE 112*  BUN 21  CREATININE 0.71  CALCIUM 8.2*   GFR: Estimated Creatinine Clearance: 106.5 mL/min (by C-G formula based on SCr of 0.71 mg/dL).  Liver Function Tests: Recent Labs  Lab 11/16/19 2044  AST 90*  ALT 75*  ALKPHOS 47  BILITOT 1.1  PROT 7.1  ALBUMIN 3.4*   Coagulation Profile: Recent Labs  Lab 11/16/19 2044  INR 1.5*   Anemia Panel: Recent Labs    11/16/19 2044  FERRITIN 828*           Radiological Exams on Admission: Personally reviewed CXR shows bilateral opacities: Dg Chest Port 1 View  Result Date: 11/16/2019 CLINICAL DATA:  History of COVID-19 positivity with increased weakness and cough EXAM: PORTABLE CHEST 1 VIEW COMPARISON:  None. FINDINGS: Cardiac shadows within normal limits. The lungs are hypoinflated. Patchy  airspace opacities are noted throughout both lungs consistent with the patient's given clinical history of COVID-19 positivity. No bony abnormality is seen. No sizable effusion is noted. IMPRESSION: Patchy airspace opacities consistent with the given clinical history. Electronically Signed   By: Inez Catalina M.D.   On: 11/16/2019 21:33    EKG: Independently reviewed. Rate 67, qtC normal, no St changes.       Assessment/Plan  Coronavirus pneumonitis with acute hypoxic respiratory failure In setting of ongoing 2020 COVID-19 pandemic.  10 days symptoms, now with hypoxia to 79% at home.  -Start  remdesivir, Day 1 of 5 -Start dexamethasone 6 mg IV daily, day 1 of 10  -Continue Zinc and Vitamin C -Check CRP -Flutter valve, IS -Ensure BID     Research study participation Patient is enrolled in the study of Xarelto for VTE prophylaxis in COVID outpatients.  He does not know if he has had placebo or Xarelto.  Has bottle of study drug with him.    I discussed with Rph.  Only trial of Xarelto in COVID is of 10 mg Xarelto for 21 days.  In light of this, I feel use of low dose (ppx dose) Lovenox is safer than no VTE ppx here in the hospital, even if he is taking active drug not placebo.  Will schedule for tomorrow afternoon, and patient knows to contact his study coordinator asap in the morning, to ask how he should handle hospital admission and need for anticoagulants here.  Transaminitis From Covid -Trend LFTs Remdesivir  Hypokalemia -Check magnesium -Supplement potassium  Hypertension Blood pressure controlled -Hold losartan and HCTZ until hemodynamics clearer -Continue amlodipine -Continue simvastatin  OSA -Defer PAP (due to aerosolization risk) at night given COVID         DVT prophylaxis: Lovenox  Code Status: FULL  Family Communication:   Disposition Plan: Anticipate IV remdesivir and steroids.   Consults called:  Admission status: INPATIENT     Medical  decision making: Patient seen at 11:12 PM on 11/16/2019.  The patient was discussed with Dr. Salina AprilMonke.  What exists of the patient's chart was reviewed in depth and summarized above.  Clinical condition: stable on supplemental O2.        Earl Liteshristopher P  [facility] Please page though AMION or Epic secure chat:  For password, contact charge nurse

## 2019-11-16 NOTE — ED Provider Notes (Signed)
[facility] Emergency Department Provider Note  ____________________________________________   First MD Initiated Contact with Patient 11/16/19 2040     (approximate)  I have reviewed the triage vital signs and the nursing notes.  History  Chief Complaint Shortness of Breath and Weakness    HPI Ralph Ware is a 63 y.o. male with history of HTN, HLD known COVID positive (11/06/19) who presents to the emergency department for worsening fatigue, cough, shortness of breath.  Patient reports extreme fatigue, becomes significantly winded and tired even with a short walk just to use the restroom.  He reports persistent fevers, as well as a dry non-productive cough.  At the onset of his symptoms 10 days ago he was having some loose stools, which have since resolved.  He denies any history of asthma, COPD, or tobacco use.  He states he is on a clinical trial right now regarding anticoagulation in COVID patients, and is either receiving a placebo or rivaroxaban under this trial.  Patient's oxygen 84% on RA with EMS, requiring 2 L nasal cannula.  Does not normally have an oxygen requirement.   Past Medical Hx Past Medical History:  Diagnosis Date  . Allergic rhinitis   . Hyperlipidemia   . Hypertension   . OSA (obstructive sleep apnea)     Problem List Patient Active Problem List   Diagnosis Date Noted  . Seasonal and perennial allergic rhinitis 07/14/2017  . Adverse food reaction 01/18/2017  . Allergic rhinitis due to pollen 02/24/2011  . Obstructive sleep apnea 08/30/2009  . RHINOSINUSITIS, ACUTE 01/03/2009  . HYPERLIPIDEMIA 06/28/2008  . HYPERTENSION 06/28/2008  . Chronic nonseasonal allergic rhinitis due to fungal spores 10/12/2007    Past Surgical Hx Past Surgical History:  Procedure Laterality Date  . NASAL SEPTUM SURGERY     x2    Medications Prior to Admission medications   Medication Sig Start Date End Date Taking? Authorizing Provider   amLODipine (NORVASC) 2.5 MG tablet TK 1 T PO D 10/19/16   [provider]  Ascorbic Acid (VITAMIN C) 1000 MG tablet Take 1,000 mg by mouth daily.      [provider]  aspirin 81 MG tablet Take 81 mg by mouth daily.      [provider]  AZASITE 1 % ophthalmic solution  06/09/11   [provider]  Azelastine HCl (ASTEPRO) 0.15 % SOLN Place 2 sprays into the nose 2 (two) times daily as needed. 09/11/16   Baird Lyons D, MD  Cholecalciferol (VITAMIN D) 1000 UNITS capsule Take 1,000 Units by mouth daily.      [provider]  Coenzyme Q10 (COQ10) 100 MG CAPS Take 1 capsule by mouth daily.      [provider]  diphenhydrAMINE (BENADRYL) 25 MG tablet Take 25 mg by mouth at bedtime as needed.      [provider]  levocetirizine (XYZAL) 5 MG tablet 1 daily for allergy 09/11/15   Baird Lyons D, MD  losartan-hydrochlorothiazide (HYZAAR) 100-12.5 MG per tablet Take 1 tablet by mouth daily.      [provider]  Magnesium 250 MG TABS Take 1 tablet by mouth daily.      [provider]  NONFORMULARY OR COMPOUNDED ITEM Allergy Vaccine 1:10 Given at [facility]    [provider]  omeprazole (PRILOSEC) 20 MG capsule TK 1 C PO QD IN THE EVE 06/08/17   [provider]  simvastatin (ZOCOR) 40 MG tablet Take 1 tablet by mouth  daily.    [provider]  zinc gluconate 50 MG tablet Take 50 mg by mouth daily.      [provider]    Allergies Patient has no known allergies.  Family Hx Family History  Problem Relation Age of Onset  . Allergic rhinitis Neg Hx   . Angioedema Neg Hx   . Asthma Neg Hx   . Atopy Neg Hx   . Eczema Neg Hx   . Immunodeficiency Neg Hx   . Urticaria Neg Hx     Social Hx Social History   Tobacco Use  . Smoking status: Never Smoker  . Smokeless tobacco: Never Used  Substance Use Topics  . Alcohol use: Yes    Alcohol/week: 5.0 standard drinks     Types: 5 Glasses of wine per week  . Drug use: No     Review of Systems  Constitutional: + for fever, fatigue. Eyes: Negative for visual changes. ENT: Negative for sore throat. Cardiovascular: Negative for chest pain. Respiratory: + for shortness of breath, cough. Gastrointestinal: Negative for nausea, vomiting.  Genitourinary: Negative for dysuria. Musculoskeletal: Negative for leg swelling. Skin: Negative for rash. Neurological: Negative for for headaches.   Physical Exam  Vital Signs: ED Triage Vitals  Enc Vitals Group     BP 11/16/19 2039 112/76     Pulse Rate 11/16/19 2039 70     Resp 11/16/19 2039 19     Temp 11/16/19 2039 99.9 F (37.7 C)     Temp Source 11/16/19 2039 Oral     SpO2 11/16/19 2037 (!) 84 %     Weight 11/16/19 2040 240 lb (108.9 kg)     Height 11/16/19 2040 5\' 6"  (1.676 m)     Head Circumference --      Peak Flow --      Pain Score 11/16/19 2040 0     Pain Loc --      Pain Edu? --      Excl. in GC? --     Constitutional: Alert and oriented.  Appears fatigued. Head: Normocephalic. Atraumatic. Eyes: Conjunctivae clear. Sclera anicteric. Nose: No congestion. No rhinorrhea. Mouth/Throat: Wearing mask.  Neck: No stridor.   Cardiovascular: Normal rate, regular rhythm. Extremities well perfused. Respiratory: Normal respiratory effort.  Speaking in full sentences.  Requires 2 L nasal cannula to maintain oxygen in mid 90s. Gastrointestinal: Soft. Non-tender. Non-distended.  Musculoskeletal: No lower extremity edema. No deformities. Neurologic:  Normal speech and language. No gross focal neurologic deficits are appreciated.  Skin: Skin is warm, dry and intact. No rash noted. Psychiatric: Mood and affect are appropriate for situation.  EKG  Personally reviewed.   Rate: 67 Rhythm: sinus Axis: normal Intervals: WNL No acute ischemic changes No STEMI    Radiology  CXR: IMPRESSION:  Patchy airspace opacities consistent with the given  clinical  history.    Procedures  Procedure(s) performed (including critical care):  Procedures   Initial Impression / Assessment and Plan / ED Course  63 y.o. male known COVID + on 11/06/19 who presents to the ED for worsening fatigue, shortness of breath, cough.  Found to be hypoxic with EMS requiring 2 L nasal cannula.  Ddx: expected COVID course, superimposed infection  We will obtain labs, XR, provide supplemental oxygen.  Given his new oxygen requirement he will require admission.  XR consistent with COVID. Labs revealed mild hypokalemia at 3, will replete.  Mildly elevated AST, ALT consistent with COVID.  Discussed with hospitalist for admission.  Final Clinical Impression(s) / ED Diagnosis  Final diagnoses:  COVID-19  Hypoxia       Note:  This document was prepared using Dragon voice recognition software and may include unintentional dictation errors.   Miguel AschoffMonks, Cosandra Plouffe L., MD 11/16/19 [phone]

## 2019-11-16 NOTE — ED Notes (Signed)
Patient provided with meal tray, water to drink. Urinal at bedside

## 2019-11-16 NOTE — Progress Notes (Signed)
Remdesivir - Pharmacy Brief Note   O:  ALT: 75 CXR:  SpO2: 94% on nasal cannula   A/P:  Remdesivir 200 mg IVPB once followed by 100 mg IVPB daily x 4 days.   Hart Robinsons, PharmD Clinical Pharmacist 11/16/2019   11/16/2019 10:35 PM

## 2019-11-17 LAB — COMPREHENSIVE METABOLIC PANEL
ALT: 60 U/L — ABNORMAL HIGH (ref 0–44)
AST: 52 U/L — ABNORMAL HIGH (ref 15–41)
Albumin: 3.2 g/dL — ABNORMAL LOW (ref 3.5–5.0)
Alkaline Phosphatase: 46 U/L (ref 38–126)
Anion gap: 12 (ref 5–15)
BUN: 19 mg/dL (ref 8–23)
CO2: 25 mmol/L (ref 22–32)
Calcium: 8.3 mg/dL — ABNORMAL LOW (ref 8.9–10.3)
Chloride: 102 mmol/L (ref 98–111)
Creatinine, Ser: 0.63 mg/dL (ref 0.61–1.24)
GFR calc Af Amer: >60 mL/min (ref >60)
GFR calc non Af Amer: >60 mL/min (ref >60)
Glucose, Bld: 147 mg/dL — ABNORMAL HIGH (ref 70–99)
Potassium: 3.3 mmol/L — ABNORMAL LOW (ref 3.5–5.1)
Sodium: 139 mmol/L (ref 135–145)
Total Bilirubin: 0.9 mg/dL (ref 0.3–1.2)
Total Protein: 7.1 g/dL (ref 6.5–8.1)

## 2019-11-17 LAB — CBC WITH DIFFERENTIAL/PLATELET
Abs Immature Granulocytes: 0.08 10*3/uL — ABNORMAL HIGH (ref 0.00–0.07)
Basophils Absolute: 0 10*3/uL (ref 0.0–0.1)
Basophils Relative: 0 %
Eosinophils Absolute: 0 10*3/uL (ref 0.0–0.5)
Eosinophils Relative: 0 %
HCT: 42.4 % (ref 39.0–52.0)
Hemoglobin: 15 g/dL (ref 13.0–17.0)
Immature Granulocytes: 1 %
Lymphocytes Relative: 9 %
Lymphs Abs: 0.7 10*3/uL (ref 0.7–4.0)
MCH: 29.1 pg (ref 26.0–34.0)
MCHC: 35.4 g/dL (ref 30.0–36.0)
MCV: 82.3 fL (ref 80.0–100.0)
Monocytes Absolute: 0.4 10*3/uL (ref 0.1–1.0)
Monocytes Relative: 6 %
Neutro Abs: 6 10*3/uL (ref 1.7–7.7)
Neutrophils Relative %: 84 %
Platelets: 176 10*3/uL (ref 150–400)
RBC: 5.15 MIL/uL (ref 4.22–5.81)
RDW: 12.6 % (ref 11.5–15.5)
WBC: 7.2 10*3/uL (ref 4.0–10.5)
nRBC: 0 % (ref 0.0–0.2)

## 2019-11-17 LAB — SARS CORONAVIRUS 2 BY RT PCR (HOSPITAL ORDER, PERFORMED IN ~~LOC~~ HOSPITAL LAB): SARS Coronavirus 2: POSITIVE — AB

## 2019-11-17 LAB — HIV ANTIBODY (ROUTINE TESTING W REFLEX): HIV Screen 4th Generation wRfx: NONREACTIVE

## 2019-11-17 LAB — C-REACTIVE PROTEIN: CRP: 11.8 mg/dL — ABNORMAL HIGH (ref <1.0)

## 2019-11-17 LAB — MAGNESIUM: Magnesium: 2.3 mg/dL (ref 1.7–2.4)

## 2019-11-17 MED ORDER — ZINC SULFATE 220 (50 ZN) MG PO CAPS
220.0000 mg | ORAL_CAPSULE | Freq: Every day | ORAL | Status: DC
  Administered 2019-11-17 – 2019-11-20 (×4): 220 mg via ORAL
  Filled 2019-11-17 (×4): qty 1

## 2019-11-17 MED ORDER — DEXAMETHASONE SODIUM PHOSPHATE 10 MG/ML IJ SOLN
6.0000 mg | INTRAMUSCULAR | Status: DC
  Administered 2019-11-17 – 2019-11-19 (×3): 6 mg via INTRAVENOUS
  Filled 2019-11-17 (×4): qty 0.6

## 2019-11-17 MED ORDER — POTASSIUM CHLORIDE CRYS ER 20 MEQ PO TBCR
40.0000 meq | EXTENDED_RELEASE_TABLET | Freq: Once | ORAL | Status: AC
  Administered 2019-11-17: 40 meq via ORAL
  Filled 2019-11-17: qty 2

## 2019-11-17 MED ORDER — ASPIRIN 81 MG PO CHEW
81.0000 mg | CHEWABLE_TABLET | Freq: Every day | ORAL | Status: DC
  Administered 2019-11-17 – 2019-11-20 (×4): 81 mg via ORAL
  Filled 2019-11-17 (×4): qty 1

## 2019-11-17 MED ORDER — ENSURE ENLIVE PO LIQD
237.0000 mL | Freq: Two times a day (BID) | ORAL | Status: DC
  Administered 2019-11-17 – 2019-11-19 (×5): 237 mL via ORAL

## 2019-11-17 MED ORDER — NON FORMULARY: Freq: Every day | Status: DC

## 2019-11-17 MED ORDER — LORATADINE 10 MG PO TABS
10.0000 mg | ORAL_TABLET | Freq: Every evening | ORAL | Status: DC
  Administered 2019-11-17 – 2019-11-19 (×3): 10 mg via ORAL
  Filled 2019-11-17 (×3): qty 1

## 2019-11-17 MED ORDER — AMLODIPINE BESYLATE 5 MG PO TABS
5.0000 mg | ORAL_TABLET | Freq: Every day | ORAL | Status: DC
  Administered 2019-11-17 – 2019-11-20 (×4): 5 mg via ORAL
  Filled 2019-11-17 (×4): qty 1

## 2019-11-17 MED ORDER — ONDANSETRON HCL 4 MG/2ML IJ SOLN: 4.0000 mg | Freq: Four times a day (QID) | INTRAMUSCULAR | Status: DC | PRN

## 2019-11-17 MED ORDER — VITAMIN C 500 MG PO TABS
500.0000 mg | ORAL_TABLET | Freq: Every day | ORAL | Status: DC
  Administered 2019-11-17 – 2019-11-20 (×4): 500 mg via ORAL
  Filled 2019-11-17 (×4): qty 1

## 2019-11-17 MED ORDER — SODIUM CHLORIDE 0.9 % IV SOLN
INTRAVENOUS | Status: DC | PRN
  Administered 2019-11-18: 500 mL via INTRAVENOUS
  Administered 2019-11-19: 21:00:00 via INTRAVENOUS

## 2019-11-17 MED ORDER — AZELASTINE HCL 0.1 % NA SOLN
2.0000 | Freq: Two times a day (BID) | NASAL | Status: DC | PRN
  Filled 2019-11-17: qty 30

## 2019-11-17 MED ORDER — GUAIFENESIN ER 600 MG PO TB12
600.0000 mg | ORAL_TABLET | Freq: Two times a day (BID) | ORAL | Status: DC
  Administered 2019-11-17 – 2019-11-20 (×7): 600 mg via ORAL
  Filled 2019-11-17 (×7): qty 1

## 2019-11-17 MED ORDER — SIMVASTATIN 20 MG PO TABS: 40.0000 mg | ORAL_TABLET | Freq: Every day | ORAL | Status: DC

## 2019-11-17 MED ORDER — HYDROCOD POLST-CPM POLST ER 10-8 MG/5ML PO SUER
5.0000 mL | Freq: Two times a day (BID) | ORAL | Status: DC | PRN
  Administered 2019-11-17: 5 mL via ORAL
  Filled 2019-11-17: qty 5

## 2019-11-17 MED ORDER — SODIUM CHLORIDE 0.9 % IV SOLN
100.0000 mg | INTRAVENOUS | Status: AC
  Administered 2019-11-17 – 2019-11-20 (×4): 100 mg via INTRAVENOUS
  Filled 2019-11-17 (×4): qty 20

## 2019-11-17 MED ORDER — ONDANSETRON HCL 4 MG PO TABS: 4.0000 mg | ORAL_TABLET | Freq: Four times a day (QID) | ORAL | Status: DC | PRN

## 2019-11-17 MED ORDER — ACETAMINOPHEN 325 MG PO TABS: 650.0000 mg | ORAL_TABLET | Freq: Four times a day (QID) | ORAL | Status: DC | PRN

## 2019-11-17 MED ORDER — ATORVASTATIN CALCIUM 20 MG PO TABS
20.0000 mg | ORAL_TABLET | Freq: Every day | ORAL | Status: DC
  Administered 2019-11-17 – 2019-11-19 (×3): 20 mg via ORAL
  Filled 2019-11-17 (×3): qty 1

## 2019-11-17 MED ORDER — GUAIFENESIN-DM 100-10 MG/5ML PO SYRP
10.0000 mL | ORAL_SOLUTION | ORAL | Status: DC | PRN
  Administered 2019-11-17 – 2019-11-18 (×3): 10 mL via ORAL
  Filled 2019-11-17 (×3): qty 10

## 2019-11-17 MED ORDER — ENOXAPARIN SODIUM 40 MG/0.4ML ~~LOC~~ SOLN
40.0000 mg | SUBCUTANEOUS | Status: DC
  Administered 2019-11-17 – 2019-11-19 (×3): 40 mg via SUBCUTANEOUS
  Filled 2019-11-17 (×3): qty 0.4

## 2019-11-17 NOTE — Progress Notes (Signed)
Patients on amlodipine and simvastatin >20mg /day have reported cases of rhabdomyolysis. Simvastatin 40 mg daily substituted atorvastatin (Lipitor) 20 mg daily.    Thanks,  Eleonore Chiquito, PharmD, BCPS

## 2019-11-17 NOTE — ED Notes (Signed)
ED TO INPATIENT HANDOFF REPORT  ED Nurse Name and Phone #: Bascom Levels Name/Age/Gender Ralph Ware 63 y.o. male Room/Bed: ED25A/ED25A  Code Status   Code Status: Not on file  Home/SNF/Other Home Patient oriented to: self, place, time and situation Is this baseline? Yes   Triage Complete: Triage complete  Chief Complaint Covid +  Triage Note Patient arrives from home with increased weakness and cough since COVID dx on 11/16. Patient attended a wedding in [place] and tested positive afterwards. Patient's main complaint is shortness of breath with coughing, especially while walking. Patient is not having pain   Allergies No Known Allergies  Level of Care/Admitting Diagnosis ED Disposition    ED Disposition Condition Comment   Admit  Hospital Area: [facility] [[id number]]  Level of Care: Med-Surg [16]  Covid Evaluation: Confirmed COVID Positive  Diagnosis: Pneumonia due to COVID-19 virus [[id number]]  Admitting Physician: Edwin Dada [[id number]]  Attending Physician: Edwin Dada [[id number]]  Estimated length of stay: past midnight tomorrow  Certification:: I certify this patient will need inpatient services for at least 2 midnights  PT Class (Do Not Modify): Inpatient [101]  PT Acc Code (Do Not Modify): Private [1]       B Medical/Surgery History Past Medical History:  Diagnosis Date  . Allergic rhinitis   . Hyperlipidemia   . Hypertension   . OSA (obstructive sleep apnea)    Past Surgical History:  Procedure Laterality Date  . NASAL SEPTUM SURGERY     x2     A IV Location/Drains/Wounds Patient Lines/Drains/Airways Status   Active Line/Drains/Airways    Name:   Placement date:   Placement time:   Site:   Days:   Peripheral IV 11/16/19 Anterior;Left Wrist   11/16/19    -    Wrist   1   Peripheral IV 11/17/19 Left Antecubital   11/17/19    0001    Antecubital   less than 1          Intake/Output Last 24 hours No  intake or output data in the 24 hours ending 11/17/19 0109  Labs/Imaging Results for orders placed or performed during the hospital encounter of 11/16/19 (from the past 48 hour(s))  Comprehensive metabolic panel     Status: Abnormal   Collection Time: 11/16/19  8:44 PM  Result Value Ref Range   Sodium 138 135 - 145 mmol/L   Potassium 3.0 (L) 3.5 - 5.1 mmol/L   Chloride 101 98 - 111 mmol/L   CO2 25 22 - 32 mmol/L   Glucose, Bld 112 (H) 70 - 99 mg/dL   BUN 21 8 - 23 mg/dL   Creatinine, Ser 0.71 0.61 - 1.24 mg/dL   Calcium 8.2 (L) 8.9 - 10.3 mg/dL   Total Protein 7.1 6.5 - 8.1 g/dL   Albumin 3.4 (L) 3.5 - 5.0 g/dL   AST 90 (H) 15 - 41 U/L   ALT 75 (H) 0 - 44 U/L   Alkaline Phosphatase 47 38 - 126 U/L   Total Bilirubin 1.1 0.3 - 1.2 mg/dL   GFR calc non Af Amer >60 >60 mL/min   GFR calc Af Amer >60 >60 mL/min   Anion gap 12 5 - 15    Comment: Performed at [facility], [address] [place], [place] [address]  CBC with Differential     Status: None   Collection Time: 11/16/19  8:44 PM  Result Value Ref Range   WBC 7.4 4.0 -  10.5 K/uL   RBC 5.16 4.22 - 5.81 MIL/uL   Hemoglobin 15.0 13.0 - 17.0 g/dL   HCT 16.143.7 09.639.0 - 04.552.0 %   MCV 84.7 80.0 - 100.0 fL   MCH 29.1 26.0 - 34.0 pg   MCHC 34.3 30.0 - 36.0 g/dL   RDW 40.912.5 81.111.5 - 91.415.5 %   Platelets 161 150 - 400 K/uL   nRBC 0.0 0.0 - 0.2 %   Neutrophils Relative % 79 %   Neutro Abs 5.9 1.7 - 7.7 K/uL   Lymphocytes Relative 13 %   Lymphs Abs 1.0 0.7 - 4.0 K/uL   Monocytes Relative 7 %   Monocytes Absolute 0.5 0.1 - 1.0 K/uL   Eosinophils Relative 0 %   Eosinophils Absolute 0.0 0.0 - 0.5 K/uL   Basophils Relative 0 %   Basophils Absolute 0.0 0.0 - 0.1 K/uL   Immature Granulocytes 1 %   Abs Immature Granulocytes 0.04 0.00 - 0.07 K/uL    Comment: Performed at [facility], [address] [place], [place] [address]  Protime-INR     Status: Abnormal   Collection Time: 11/16/19  8:44 PM  Result Value Ref Range    Prothrombin Time 17.8 (H) 11.4 - 15.2 seconds   INR 1.5 (H) 0.8 - 1.2    Comment: (NOTE) INR goal varies based on device and disease states. Performed at [facility], [address] [place], [place] [address]   Lactate dehydrogenase     Status: Abnormal   Collection Time: 11/16/19  8:44 PM  Result Value Ref Range   LDH 351 (H) 98 - 192 U/L    Comment: Performed at [facility], [address] [place], [place] [address]  Ferritin (Iron Binding Protein)     Status: Abnormal   Collection Time: 11/16/19  8:44 PM  Result Value Ref Range   Ferritin 828 (H) 24 - 336 ng/mL    Comment: Performed at [facility], [address] [place], [place] [address]  Fibrin derivatives D-Dimer [facility] only)     Status: Abnormal   Collection Time: 11/16/19  8:44 PM  Result Value Ref Range   Fibrin derivatives D-dimer ([facility]) 952.06 (H) 0.00 - 499.00 ng/mL (FEU)    Comment: (NOTE) <> Exclusion of Venous Thromboembolism (VTE) - OUTPATIENT ONLY   (Emergency Department or [place])   0-499 ng/ml (FEU): With a low to intermediate pretest probability                      for VTE this test result excludes the diagnosis                      of VTE.   >499 ng/ml (FEU) : VTE not excluded; additional work up for VTE is                      required. <> Testing on Inpatients and Evaluation of Disseminated Intravascular   Coagulation (DIC) Reference Range:   0-499 ng/ml (FEU) Performed at [facility], [address] [place], [place] [address]    Dg Chest Port 1 View  Result Date: 11/16/2019 CLINICAL DATA:  History of COVID-19 positivity with increased weakness and cough EXAM: PORTABLE CHEST 1 VIEW COMPARISON:  None. FINDINGS: Cardiac shadows within normal limits. The lungs are hypoinflated. Patchy airspace opacities are noted throughout both lungs consistent with the patient's given clinical history of COVID-19 positivity. No bony abnormality is  seen. No sizable effusion  is noted. IMPRESSION: Patchy airspace opacities consistent with the given clinical history. Electronically Signed   By: Alcide Clever M.D.   On: 11/16/2019 21:33    Pending Labs Unresulted Labs (From admission, onward)    Start     Ordered   11/16/19 2330  C-reactive protein  Once,   STAT     11/16/19 2330   Signed and Held  HIV Antibody (routine testing w rflx)  (HIV Antibody (Routine testing w reflex) panel)  Tomorrow morning,   R     Signed and Held   Signed and Held  Creatinine, serum  (enoxaparin (LOVENOX)    CrCl >/= 30 ml/min)  Weekly,   R    Comments: while on enoxaparin therapy    Signed and Held   Signed and Held  CBC with Differential/Platelet  Daily,   R     Signed and Held   Signed and Held  Comprehensive metabolic panel  Daily,   R     Signed and Held          Vitals/Pain Today's Vitals   11/17/19 0000 11/17/19 0030 11/17/19 0100 11/17/19 0101  BP: 130/75 135/76 133/75   Pulse: 78 75 74 73  Resp: 15 11  12   Temp:      TempSrc:      SpO2: 95% 91% (!) 88% 90%  Weight:      Height:      PainSc:        Isolation Precautions Airborne and Contact precautions  Medications Medications  remdesivir 200 mg in sodium chloride 0.9 % 250 mL IVPB (0 mg Intravenous Stopped 11/17/19 0046)    Followed by  remdesivir 100 mg in sodium chloride 0.9 % 250 mL IVPB (has no administration in time range)  dexamethasone (DECADRON) injection 6 mg (6 mg Intravenous Given 11/16/19 2235)  potassium chloride SA (KLOR-CON) CR tablet 40 mEq (40 mEq Oral Given 11/16/19 2234)    Mobility walks Low fall risk   Focused Assessments Pulmonary Assessment Handoff:  Lung sounds:   O2 Device: Nasal Cannula O2 Flow Rate (L/min): 3 L/min      R Recommendations: See Admitting Provider Note  Report given to:   Additional Notes: COVID +

## 2019-11-17 NOTE — Progress Notes (Addendum)
1        [facility] [place] NAME: Ralph Ware    MR#:  [id number]  DATE OF BIRTH:  06/09/1956  SUBJECTIVE:  CHIEF COMPLAINT:   Chief Complaint  Patient presents with  . Shortness of Breath  . Weakness  Requesting expectorant, short of breath REVIEW OF SYSTEMS:  Review of Systems  Constitutional: Negative for diaphoresis, fever, malaise/fatigue and weight loss.  HENT: Negative for ear discharge, ear pain, hearing loss, nosebleeds, sore throat and tinnitus.   Eyes: Negative for blurred vision and pain.  Respiratory: Positive for shortness of breath. Negative for cough, hemoptysis and wheezing.   Cardiovascular: Negative for chest pain, palpitations, orthopnea and leg swelling.  Gastrointestinal: Negative for abdominal pain, blood in stool, constipation, diarrhea, heartburn, nausea and vomiting.  Genitourinary: Negative for dysuria, frequency and urgency.  Musculoskeletal: Negative for back pain and myalgias.  Skin: Negative for itching and rash.  Neurological: Negative for dizziness, tingling, tremors, focal weakness, seizures, weakness and headaches.  Psychiatric/Behavioral: Negative for depression. The patient is not nervous/anxious.     DRUG ALLERGIES:  No Known Allergies VITALS:  Blood pressure 137/83, pulse 65, temperature 98.5 F (36.9 C), temperature source Oral, resp. rate 17, height 5\' 6"  (1.676 m), weight 101.1 kg, SpO2 94 %. PHYSICAL EXAMINATION:  Physical Exam HENT:     Head: Normocephalic and atraumatic.  Eyes:     Conjunctiva/sclera: Conjunctivae normal.     Pupils: Pupils are equal, round, and reactive to light.  Neck:     Musculoskeletal: Normal range of motion and neck supple.     Thyroid: No thyromegaly.     Trachea: No tracheal deviation.  Cardiovascular:     Rate and Rhythm: Normal rate and regular rhythm.     Heart sounds: Normal heart sounds.  Pulmonary:     Effort: Pulmonary effort is normal. No respiratory distress.      Breath sounds: Normal breath sounds. No wheezing.  Chest:     Chest wall: No tenderness.  Abdominal:     General: Bowel sounds are normal. There is no distension.     Palpations: Abdomen is soft.     Tenderness: There is no abdominal tenderness.  Musculoskeletal: Normal range of motion.  Skin:    General: Skin is warm and dry.     Findings: No rash.  Neurological:     Mental Status: He is alert and oriented to person, place, and time.     Cranial Nerves: No cranial nerve deficit.    LABORATORY PANEL:  Male CBC Recent Labs  Lab 11/17/19 1001  WBC 7.2  HGB 15.0  HCT 42.4  PLT 176   ------------------------------------------------------------------------------------------------------------------ Chemistries  Recent Labs  Lab 11/17/19 1001  NA 139  K 3.3*  CL 102  CO2 25  GLUCOSE 147*  BUN 19  CREATININE 0.63  CALCIUM 8.3*  MG 2.3  AST 52*  ALT 60*  ALKPHOS 46  BILITOT 0.9   RADIOLOGY:  Dg Chest Port 1 View  Result Date: 11/16/2019 CLINICAL DATA:  History of COVID-19 positivity with increased weakness and cough EXAM: PORTABLE CHEST 1 VIEW COMPARISON:  None. FINDINGS: Cardiac shadows within normal limits. The lungs are hypoinflated. Patchy airspace opacities are noted throughout both lungs consistent with the patient's given clinical history of COVID-19 positivity. No bony abnormality is seen. No sizable effusion is noted. IMPRESSION: Patchy airspace opacities consistent with the given clinical history. Electronically Signed   By: Linus Mako.D.  On: 11/16/2019 21:33   ASSESSMENT AND PLAN:   Coronavirus pneumonitis with acute hypoxic respiratory failure In setting of ongoing 2020 COVID-19 pandemic. 10 days symptoms, now with hypoxia to 79% at home. We will add Mucinex breath is thick cough as he is not able to expectorate -Remdesivir, Day 2 of 5 - dexamethasone 6 mg IV daily, day 2 of 10 -Daily zinc and Vitamin C -Check CRP -Flutter valve, IS -Ensure  BID   Research study participation Patient was asked not to take study drug here  Transaminitis From Covid -Trend LFTs Remdesivir  Hypokalemia - replete and recheck -Check magnesium -Supplement potassium  Hypertension Blood pressure controlled -Hold losartan and HCTZ until hemodynamics clearer -Continue amlodipine -Continue simvastatin  OSA -Defer PAP (due to aerosolization risk) at night given COVID       DVT prophylaxis: Lovenox  Code Status: FULL  Family Communication:   Disposition Plan:  Home Consults called:  None Admission status: INPATIENT       All the records are reviewed and case discussed with Care Management/Social Worker. Management plans discussed with the patient, nursing and they are in agreement.  CODE STATUS: Full Code  TOTAL TIME TAKING CARE OF THIS PATIENT: 35 minutes.   More than 50% of the time was spent in counseling/coordination of care: YES  POSSIBLE D/C IN 3-4 DAYS, DEPENDING ON CLINICAL CONDITION.   Delfino Lovett M.D on 11/17/2019 at 2:28 PM  Between 7am to 6pm - Pager - [phone]  After 6pm go to [email or web address] - password [email or web address]  [facility]   CC: Primary care physician; Marden Noble, MD  Note: This dictation was prepared with Dragon dictation along with smaller phrase technology. Any transcriptional errors that result from this process are unintentional.

## 2019-11-17 NOTE — Progress Notes (Signed)
Initial Nutrition Assessment  DOCUMENTATION CODES:   Obesity unspecified  INTERVENTION:   Ensure Enlive po BID, each supplement provides 350 kcal and 20 grams of protein  NUTRITION DIAGNOSIS:   Increased nutrient needs related to acute illness(COVID 19) as evidenced by increased estimated needs.  GOAL:   Patient will meet greater than or equal to 90% of their needs  MONITOR:   PO intake, Supplement acceptance, Labs, Weight trends, Skin, I & O's  REASON FOR ASSESSMENT:   Malnutrition Screening Tool    ASSESSMENT:   63 y.o. M with hx HTN, allergic rhinitis, and hx SMA dissection in 2012 admitted with [facility] with pt via phone. Pt reports poor appetite and oral intake for several days pta but reports that his appetite is improved today and he ate 100% of breakfast and lunch. Pt educated on COVID 19 and how this increases nutrient needs. Pt is willing to drink chocolate Ensure; RD will order. Per chart review, pt appears weight stable pta.   Medications reviewed and include: aspirin, dexamethasone, lovenox, vitamin C, zinc sulfate  Labs reviewed: K 3.3(L), Mg 2.3 wnl  Unable to complete Nutrition-Focused physical exam at this time as pt with COVID 19.   Diet Order:   Diet Order            Diet regular Room service appropriate? Yes; Fluid consistency: Thin  Diet effective now             EDUCATION NEEDS:   Education needs have been addressed  Skin:  Skin Assessment: Reviewed RN Assessment  Last BM:  11/26  Height:   Ht Readings from Last 1 Encounters:  11/16/19 5\' 6"  (1.676 m)    Weight:   Wt Readings from Last 1 Encounters:  11/17/19 101.1 kg    Ideal Body Weight:  64.5 kg  BMI:  Body mass index is 35.98 kg/m.  Estimated Nutritional Needs:   Kcal:  2100-2400kcal/day  Protein:  105-120g/day  Fluid:  1.9L/day  Koleen Distance MS, RD, LDN Pager #- [phone] Office#- [phone] After Hours Pager: [phone]

## 2019-11-18 LAB — COMPREHENSIVE METABOLIC PANEL
ALT: 45 U/L — ABNORMAL HIGH (ref 0–44)
AST: 38 U/L (ref 15–41)
Albumin: 3 g/dL — ABNORMAL LOW (ref 3.5–5.0)
Alkaline Phosphatase: 41 U/L (ref 38–126)
Anion gap: 9 (ref 5–15)
BUN: 22 mg/dL (ref 8–23)
CO2: 27 mmol/L (ref 22–32)
Calcium: 8.2 mg/dL — ABNORMAL LOW (ref 8.9–10.3)
Chloride: 104 mmol/L (ref 98–111)
Creatinine, Ser: 0.82 mg/dL (ref 0.61–1.24)
GFR calc Af Amer: >60 mL/min (ref >60)
GFR calc non Af Amer: >60 mL/min (ref >60)
Glucose, Bld: 169 mg/dL — ABNORMAL HIGH (ref 70–99)
Potassium: 4.1 mmol/L (ref 3.5–5.1)
Sodium: 140 mmol/L (ref 135–145)
Total Bilirubin: 0.6 mg/dL (ref 0.3–1.2)
Total Protein: 6.4 g/dL — ABNORMAL LOW (ref 6.5–8.1)

## 2019-11-18 LAB — CBC WITH DIFFERENTIAL/PLATELET
Abs Immature Granulocytes: 0.12 10*3/uL — ABNORMAL HIGH (ref 0.00–0.07)
Basophils Absolute: 0 10*3/uL (ref 0.0–0.1)
Basophils Relative: 0 %
Eosinophils Absolute: 0 10*3/uL (ref 0.0–0.5)
Eosinophils Relative: 0 %
HCT: 41.7 % (ref 39.0–52.0)
Hemoglobin: 14.4 g/dL (ref 13.0–17.0)
Immature Granulocytes: 1 %
Lymphocytes Relative: 12 %
Lymphs Abs: 1.1 10*3/uL (ref 0.7–4.0)
MCH: 29 pg (ref 26.0–34.0)
MCHC: 34.5 g/dL (ref 30.0–36.0)
MCV: 83.9 fL (ref 80.0–100.0)
Monocytes Absolute: 0.6 10*3/uL (ref 0.1–1.0)
Monocytes Relative: 6 %
Neutro Abs: 7.8 10*3/uL — ABNORMAL HIGH (ref 1.7–7.7)
Neutrophils Relative %: 81 %
Platelets: 205 10*3/uL (ref 150–400)
RBC: 4.97 MIL/uL (ref 4.22–5.81)
RDW: 12.9 % (ref 11.5–15.5)
WBC: 9.6 10*3/uL (ref 4.0–10.5)
nRBC: 0 % (ref 0.0–0.2)

## 2019-11-18 MED ORDER — SODIUM CHLORIDE 0.9% FLUSH
3.0000 mL | Freq: Two times a day (BID) | INTRAVENOUS | Status: DC
  Administered 2019-11-18 – 2019-11-19 (×5): 3 mL via INTRAVENOUS

## 2019-11-18 NOTE — Progress Notes (Signed)
1        [facility] at [place] NAME: Ralph Ware    MR#:  [id number]  DATE OF BIRTH:  [birth year]  SUBJECTIVE:  CHIEF COMPLAINT:   Chief Complaint  Patient presents with  . Shortness of Breath  . Weakness  Cough improving with Mucinex on board, able to expectorate.  Weak REVIEW OF SYSTEMS:  Review of Systems  Constitutional: Negative for diaphoresis, fever, malaise/fatigue and weight loss.  HENT: Negative for ear discharge, ear pain, hearing loss, nosebleeds, sore throat and tinnitus.   Eyes: Negative for blurred vision and pain.  Respiratory: Positive for shortness of breath. Negative for cough, hemoptysis and wheezing.   Cardiovascular: Negative for chest pain, palpitations, orthopnea and leg swelling.  Gastrointestinal: Negative for abdominal pain, blood in stool, constipation, diarrhea, heartburn, nausea and vomiting.  Genitourinary: Negative for dysuria, frequency and urgency.  Musculoskeletal: Negative for back pain and myalgias.  Skin: Negative for itching and rash.  Neurological: Negative for dizziness, tingling, tremors, focal weakness, seizures, weakness and headaches.  Psychiatric/Behavioral: Negative for depression. The patient is not nervous/anxious.     DRUG ALLERGIES:  No Known Allergies VITALS:  Blood pressure 131/75, pulse (!) 58, temperature 98.4 F (36.9 C), temperature source Oral, resp. rate 17, height 5\' 6"  (1.676 m), weight 101.7 kg, SpO2 92 %. PHYSICAL EXAMINATION:  Physical Exam HENT:     Head: Normocephalic and atraumatic.  Eyes:     Conjunctiva/sclera: Conjunctivae normal.     Pupils: Pupils are equal, round, and reactive to light.  Neck:     Musculoskeletal: Normal range of motion and neck supple.     Thyroid: No thyromegaly.     Trachea: No tracheal deviation.  Cardiovascular:     Rate and Rhythm: Normal rate and regular rhythm.     Heart sounds: Normal heart sounds.  Pulmonary:     Effort: Pulmonary effort is  normal. No respiratory distress.     Breath sounds: Normal breath sounds. No wheezing.  Chest:     Chest wall: No tenderness.  Abdominal:     General: Bowel sounds are normal. There is no distension.     Palpations: Abdomen is soft.     Tenderness: There is no abdominal tenderness.  Musculoskeletal: Normal range of motion.  Skin:    General: Skin is warm and dry.     Findings: No rash.  Neurological:     Mental Status: He is alert and oriented to person, place, and time.     Cranial Nerves: No cranial nerve deficit.    LABORATORY PANEL:  Male CBC Recent Labs  Lab 11/18/19 0527  WBC 9.6  HGB 14.4  HCT 41.7  PLT 205   ------------------------------------------------------------------------------------------------------------------ Chemistries  Recent Labs  Lab 11/17/19 1001 11/18/19 0527  NA 139 140  K 3.3* 4.1  CL 102 104  CO2 25 27  GLUCOSE 147* 169*  BUN 19 22  CREATININE 0.63 0.82  CALCIUM 8.3* 8.2*  MG 2.3  --   AST 52* 38  ALT 60* 45*  ALKPHOS 46 41  BILITOT 0.9 0.6   RADIOLOGY:  No results found. ASSESSMENT AND PLAN:   COVID-19 pneumonia with acute hypoxic respiratory failure In setting of ongoing 2020 COVID-19 pandemic. 10 days symptoms, now with hypoxia to 79% at home. - Mucinex is helping him to expectorate mucus -Remdesivir, Day 3 of 5 - dexamethasone 6 mg IV daily, day 3 of 10 -Daily zinc and Vitamin C -Daily CRP -Flutter  valve, IS -Ensure BID   Research study participation Patient was asked not to take study drug here  Transaminitis From Covid -Trend LFTs Remdesivir  Hypokalemia - repleted and resolved  Hypertension Blood pressure controlled -Hold losartan and HCTZ until hemodynamics clearer -Continue amlodipine -Continue simvastatin  OSA -Defer PAP (due to aerosolization risk) at night given COVID       DVT prophylaxis: Lovenox  Code Status: FULL  Family Communication:   Disposition Plan:  Home Consults  called:  None Admission status: INPATIENT       All the records are reviewed and case discussed with Care Management/Social Worker. Management plans discussed with the patient, nursing and they are in agreement.  CODE STATUS: Full Code  TOTAL TIME TAKING CARE OF THIS PATIENT: 35 minutes.   More than 50% of the time was spent in counseling/coordination of care: YES  POSSIBLE D/C IN 2-3 DAYS, DEPENDING ON CLINICAL CONDITION.   Delfino Lovett M.D on 11/18/2019 at 1:42 PM  Between 7am to 6pm - Pager - [phone]  After 6pm go to [email or web address] - password [email or web address]  [facility]   CC: Primary care physician; Marden Noble, MD  Note: This dictation was prepared with Dragon dictation along with smaller phrase technology. Any transcriptional errors that result from this process are unintentional.

## 2019-11-19 ENCOUNTER — Inpatient Hospital Stay: Payer: Managed Care, Other (non HMO)

## 2019-11-19 LAB — COMPREHENSIVE METABOLIC PANEL
ALT: 36 U/L (ref 0–44)
AST: 27 U/L (ref 15–41)
Albumin: 3 g/dL — ABNORMAL LOW (ref 3.5–5.0)
Alkaline Phosphatase: 45 U/L (ref 38–126)
Anion gap: 9 (ref 5–15)
BUN: 22 mg/dL (ref 8–23)
CO2: 26 mmol/L (ref 22–32)
Calcium: 8.4 mg/dL — ABNORMAL LOW (ref 8.9–10.3)
Chloride: 104 mmol/L (ref 98–111)
Creatinine, Ser: 0.73 mg/dL (ref 0.61–1.24)
GFR calc Af Amer: >60 mL/min (ref >60)
GFR calc non Af Amer: >60 mL/min (ref >60)
Glucose, Bld: 167 mg/dL — ABNORMAL HIGH (ref 70–99)
Potassium: 4 mmol/L (ref 3.5–5.1)
Sodium: 139 mmol/L (ref 135–145)
Total Bilirubin: 0.8 mg/dL (ref 0.3–1.2)
Total Protein: 6.3 g/dL — ABNORMAL LOW (ref 6.5–8.1)

## 2019-11-19 LAB — CBC WITH DIFFERENTIAL/PLATELET
Abs Immature Granulocytes: 0.28 10*3/uL — ABNORMAL HIGH (ref 0.00–0.07)
Basophils Absolute: 0 10*3/uL (ref 0.0–0.1)
Basophils Relative: 0 %
Eosinophils Absolute: 0 10*3/uL (ref 0.0–0.5)
Eosinophils Relative: 0 %
HCT: 42.1 % (ref 39.0–52.0)
Hemoglobin: 14.2 g/dL (ref 13.0–17.0)
Immature Granulocytes: 2 %
Lymphocytes Relative: 12 %
Lymphs Abs: 1.5 10*3/uL (ref 0.7–4.0)
MCH: 29.2 pg (ref 26.0–34.0)
MCHC: 33.7 g/dL (ref 30.0–36.0)
MCV: 86.6 fL (ref 80.0–100.0)
Monocytes Absolute: 0.8 10*3/uL (ref 0.1–1.0)
Monocytes Relative: 7 %
Neutro Abs: 9.4 10*3/uL — ABNORMAL HIGH (ref 1.7–7.7)
Neutrophils Relative %: 79 %
Platelets: 246 10*3/uL (ref 150–400)
RBC: 4.86 MIL/uL (ref 4.22–5.81)
RDW: 12.8 % (ref 11.5–15.5)
WBC: 12 10*3/uL — ABNORMAL HIGH (ref 4.0–10.5)
nRBC: 0 % (ref 0.0–0.2)

## 2019-11-19 NOTE — Plan of Care (Signed)
  Problem: Respiratory: Goal: Will maintain a patent airway Outcome: Progressing Goal: Complications related to the disease process, condition or treatment will be avoided or minimized Outcome: Progressing   Problem: Education: Goal: Knowledge of risk factors and measures for prevention of condition will improve Outcome: Progressing   Problem: Respiratory: Goal: Will maintain a patent airway Outcome: Progressing Goal: Complications related to the disease process, condition or treatment will be avoided or minimized Outcome: Progressing

## 2019-11-19 NOTE — Progress Notes (Signed)
1        [place] at [facility]   PATIENT NAME: Ralph Ware    MR#:  [id number]  DATE OF BIRTH:  01-17-1956  SUBJECTIVE:  CHIEF COMPLAINT:   Chief Complaint  Patient presents with  . Shortness of Breath  . Weakness  no new c/o, sob improving. REVIEW OF SYSTEMS:  Review of Systems  Constitutional: Negative for diaphoresis, fever, malaise/fatigue and weight loss.  HENT: Negative for ear discharge, ear pain, hearing loss, nosebleeds, sore throat and tinnitus.   Eyes: Negative for blurred vision and pain.  Respiratory: Positive for shortness of breath. Negative for cough, hemoptysis and wheezing.   Cardiovascular: Negative for chest pain, palpitations, orthopnea and leg swelling.  Gastrointestinal: Negative for abdominal pain, blood in stool, constipation, diarrhea, heartburn, nausea and vomiting.  Genitourinary: Negative for dysuria, frequency and urgency.  Musculoskeletal: Negative for back pain and myalgias.  Skin: Negative for itching and rash.  Neurological: Negative for dizziness, tingling, tremors, focal weakness, seizures, weakness and headaches.  Psychiatric/Behavioral: Negative for depression. The patient is not nervous/anxious.     DRUG ALLERGIES:  No Known Allergies VITALS:  Blood pressure 132/84, pulse 69, temperature 98.6 F (37 C), temperature source Oral, resp. rate (!) 21, height 5\' 6"  (1.676 m), weight 101.7 kg, SpO2 97 %. PHYSICAL EXAMINATION:  Physical Exam HENT:     Head: Normocephalic and atraumatic.  Eyes:     Conjunctiva/sclera: Conjunctivae normal.     Pupils: Pupils are equal, round, and reactive to light.  Neck:     Musculoskeletal: Normal range of motion and neck supple.     Thyroid: No thyromegaly.     Trachea: No tracheal deviation.  Cardiovascular:     Rate and Rhythm: Normal rate and regular rhythm.     Heart sounds: Normal heart sounds.  Pulmonary:     Effort: Pulmonary effort is normal. No respiratory distress.     Breath  sounds: Normal breath sounds. No wheezing.  Chest:     Chest wall: No tenderness.  Abdominal:     General: Bowel sounds are normal. There is no distension.     Palpations: Abdomen is soft.     Tenderness: There is no abdominal tenderness.  Musculoskeletal: Normal range of motion.  Skin:    General: Skin is warm and dry.     Findings: No rash.  Neurological:     Mental Status: He is alert and oriented to person, place, and time.     Cranial Nerves: No cranial nerve deficit.    LABORATORY PANEL:  Male CBC Recent Labs  Lab 11/19/19 0628  WBC 12.0*  HGB 14.2  HCT 42.1  PLT 246   ------------------------------------------------------------------------------------------------------------------ Chemistries  Recent Labs  Lab 11/17/19 1001  11/19/19 0628  NA 139   < > 139  K 3.3*   < > 4.0  CL 102   < > 104  CO2 25   < > 26  GLUCOSE 147*   < > 167*  BUN 19   < > 22  CREATININE 0.63   < > 0.73  CALCIUM 8.3*   < > 8.4*  MG 2.3  --   --   AST 52*   < > 27  ALT 60*   < > 36  ALKPHOS 46   < > 45  BILITOT 0.9   < > 0.8   < > = values in this interval not displayed.   RADIOLOGY:  Dg Chest Port 1 [address]  Result Date: 11/19/2019 CLINICAL DATA:  Cough. Patient admitted 11/16/2019 with COVID-19 pneumonia. EXAM: PORTABLE CHEST 1 VIEW COMPARISON:  Single-view of the chest 11/16/2019. FINDINGS: Patchy bilateral airspace disease throughout both lungs has worsened. No pneumothorax or pleural fluid. Heart size is normal. IMPRESSION: Worsened multifocal airspace disease consistent with pneumonia. Electronically Signed   By: Inge Rise M.D.   On: 11/19/2019 13:13   ASSESSMENT AND PLAN:   COVID-19 pneumonia with acute hypoxic respiratory failure On 1 L oxygen at this time.  Wean oxygen as able to room air.  Ambulate.  PT and OT consultation - Mucinex is helping him to expectorate mucus -Remdesivir, Day 4 of 5 - dexamethasone 6 mg IV daily, day 4 of 10 -Daily zinc and Vitamin C  -Daily CRP -Flutter valve, IS -Ensure BID   Research study participation Patient was asked not to take study drug here  Transaminitis From Covid -Trend LFTs Remdesivir  Hypokalemia - repleted and resolved  Hypertension Blood pressure controlled -Hold losartan and HCTZ for now and can be resumed at discharge -Continue amlodipine -Continue simvastatin  OSA -Defer PAP (due to aerosolization risk) at night given COVID       DVT prophylaxis: Lovenox    All the records are reviewed and case discussed with Care Management/Social Worker. Management plans discussed with the patient, nursing and they are in agreement.  CODE STATUS: Full Code  TOTAL TIME TAKING CARE OF THIS PATIENT: 35 minutes.   More than 50% of the time was spent in counseling/coordination of care: YES  POSSIBLE D/C IN 1 DAYS, DEPENDING ON CLINICAL CONDITION.   Max Sane M.D on 11/19/2019 at 1:30 PM  Between 7am to 6pm - Pager - [phone]  After 6pm go to [email or web address] - password [email or web address]  [facility]   CC: Primary care physician; Josetta Huddle, MD  Note: This dictation was prepared with Dragon dictation along with smaller phrase technology. Any transcriptional errors that result from this process are unintentional.

## 2019-11-19 NOTE — Progress Notes (Signed)
Made aware by ccmd patients heart rate increased to 182 svt, however was unsustained. Currently 70. Upon entering room patient sitting up on the side of the bed using flutter valve with occasional cough. Patient reports no distress. Made dr. Manuella Ghazi aware of increase in heart rate, per md okay to leave tele monitor on. Will continue to monitor closely

## 2019-11-20 DIAGNOSIS — R0902 Hypoxemia: Secondary | ICD-10-CM

## 2019-11-20 DIAGNOSIS — U071 COVID-19: Secondary | ICD-10-CM

## 2019-11-20 LAB — COMPREHENSIVE METABOLIC PANEL
ALT: 33 U/L (ref 0–44)
AST: 25 U/L (ref 15–41)
Albumin: 3 g/dL — ABNORMAL LOW (ref 3.5–5.0)
Alkaline Phosphatase: 47 U/L (ref 38–126)
Anion gap: 12 (ref 5–15)
BUN: 22 mg/dL (ref 8–23)
CO2: 25 mmol/L (ref 22–32)
Calcium: 8.5 mg/dL — ABNORMAL LOW (ref 8.9–10.3)
Chloride: 102 mmol/L (ref 98–111)
Creatinine, Ser: 0.63 mg/dL (ref 0.61–1.24)
GFR calc Af Amer: >60 mL/min (ref >60)
GFR calc non Af Amer: >60 mL/min (ref >60)
Glucose, Bld: 160 mg/dL — ABNORMAL HIGH (ref 70–99)
Potassium: 4.1 mmol/L (ref 3.5–5.1)
Sodium: 139 mmol/L (ref 135–145)
Total Bilirubin: 0.8 mg/dL (ref 0.3–1.2)
Total Protein: 6.5 g/dL (ref 6.5–8.1)

## 2019-11-20 LAB — CBC WITH DIFFERENTIAL/PLATELET
Abs Immature Granulocytes: 0.43 10*3/uL — ABNORMAL HIGH (ref 0.00–0.07)
Basophils Absolute: 0 10*3/uL (ref 0.0–0.1)
Basophils Relative: 0 %
Eosinophils Absolute: 0 10*3/uL (ref 0.0–0.5)
Eosinophils Relative: 0 %
HCT: 42.9 % (ref 39.0–52.0)
Hemoglobin: 14.3 g/dL (ref 13.0–17.0)
Immature Granulocytes: 3 %
Lymphocytes Relative: 9 %
Lymphs Abs: 1.3 10*3/uL (ref 0.7–4.0)
MCH: 28.7 pg (ref 26.0–34.0)
MCHC: 33.3 g/dL (ref 30.0–36.0)
MCV: 86.1 fL (ref 80.0–100.0)
Monocytes Absolute: 1 10*3/uL (ref 0.1–1.0)
Monocytes Relative: 7 %
Neutro Abs: 10.9 10*3/uL — ABNORMAL HIGH (ref 1.7–7.7)
Neutrophils Relative %: 81 %
Platelets: 286 10*3/uL (ref 150–400)
RBC: 4.98 MIL/uL (ref 4.22–5.81)
RDW: 12.9 % (ref 11.5–15.5)
WBC: 13.5 10*3/uL — ABNORMAL HIGH (ref 4.0–10.5)
nRBC: 0 % (ref 0.0–0.2)

## 2019-11-20 MED ORDER — PREDNISONE 10 MG (21) PO TBPK: ORAL_TABLET | ORAL | 0 refills | Status: DC

## 2019-11-20 NOTE — Discharge Instructions (Signed)
COVID-19 °COVID-19 is a respiratory infection that is caused by a virus called severe acute respiratory syndrome coronavirus 2 (SARS-CoV-2). The disease is also known as coronavirus disease or novel coronavirus. In some people, the virus may not cause any symptoms. In others, it may cause a serious infection. The infection can get worse quickly and can lead to complications, such as: °· Pneumonia, or infection of the lungs. °· Acute respiratory distress syndrome or ARDS. This is fluid build-up in the lungs. °· Acute respiratory failure. This is a condition in which there is not enough oxygen passing from the lungs to the body. °· Sepsis or septic shock. This is a serious bodily reaction to an infection. °· Blood clotting problems. °· Secondary infections due to bacteria or fungus. °The virus that causes COVID-19 is contagious. This means that it can spread from person to person through droplets from coughs and sneezes (respiratory secretions). °What are the causes? °This illness is caused by a virus. You may catch the virus by: °· Breathing in droplets from an infected person's cough or sneeze. °· Touching something, like a table or a doorknob, that was exposed to the virus (contaminated) and then touching your mouth, nose, or eyes. °What increases the risk? °Risk for infection °You are more likely to be infected with this virus if you: °· Live in or travel to an area with a COVID-19 outbreak. °· Come in contact with a sick person who recently traveled to an area with a COVID-19 outbreak. °· Provide care for or live with a person who is infected with COVID-19. °Risk for serious illness °You are more likely to become seriously ill from the virus if you: °· Are 65 years of age or older. °· Have a long-term disease that lowers your body's ability to fight infection (immunocompromised). °· Live in a nursing home or long-term care facility. °· Have a long-term (chronic) disease such as: °? Chronic lung disease, including  chronic obstructive pulmonary disease or asthma °? Heart disease. °? Diabetes. °? Chronic kidney disease. °? Liver disease. °· Are obese. °What are the signs or symptoms? °Symptoms of this condition can range from mild to severe. Symptoms may appear any time from 2 to 14 days after being exposed to the virus. They include: °· A fever. °· A cough. °· Difficulty breathing. °· Chills. °· Muscle pains. °· A sore throat. °· Loss of taste or smell. °Some people may also have stomach problems, such as nausea, vomiting, or diarrhea. °Other people may not have any symptoms of COVID-19. °How is this diagnosed? °This condition may be diagnosed based on: °· Your signs and symptoms, especially if: °? You live in an area with a COVID-19 outbreak. °? You recently traveled to or from an area where the virus is common. °? You provide care for or live with a person who was diagnosed with COVID-19. °· A physical exam. °· Lab tests, which may include: °? A nasal swab to take a sample of fluid from your nose. °? A throat swab to take a sample of fluid from your throat. °? A sample of mucus from your lungs (sputum). °? Blood tests. °· Imaging tests, which may include, X-rays, CT scan, or ultrasound. °How is this treated? °At present, there is no medicine to treat COVID-19. Medicines that treat other diseases are being used on a trial basis to see if they are effective against COVID-19. °Your health care provider will talk with you about ways to treat your symptoms. For most   people, the infection is mild and can be managed at home with rest, fluids, and over-the-counter medicines. °Treatment for a serious infection usually takes places in a hospital intensive care unit (ICU). It may include one or more of the following treatments. These treatments are given until your symptoms improve. °· Receiving fluids and medicines through an IV. °· Supplemental oxygen. Extra oxygen is given through a tube in the nose, a face mask, or a  hood. °· Positioning you to lie on your stomach (prone position). This makes it easier for oxygen to get into the lungs. °· Continuous positive airway pressure (CPAP) or bi-level positive airway pressure (BPAP) machine. This treatment uses mild air pressure to keep the airways open. A tube that is connected to a motor delivers oxygen to the body. °· Ventilator. This treatment moves air into and out of the lungs by using a tube that is placed in your windpipe. °· Tracheostomy. This is a procedure to create a hole in the neck so that a breathing tube can be inserted. °· Extracorporeal membrane oxygenation (ECMO). This procedure gives the lungs a chance to recover by taking over the functions of the heart and lungs. It supplies oxygen to the body and removes carbon dioxide. °Follow these instructions at home: °Lifestyle °· If you are sick, stay home except to get medical care. Your health care provider will tell you how long to stay home. Call your health care provider before you go for medical care. °· Rest at home as told by your health care provider. °· Do not use any products that contain nicotine or tobacco, such as cigarettes, e-cigarettes, and chewing tobacco. If you need help quitting, ask your health care provider. °· Return to your normal activities as told by your health care provider. Ask your health care provider what activities are safe for you. °General instructions °· Take over-the-counter and prescription medicines only as told by your health care provider. °· Drink enough fluid to keep your urine pale yellow. °· Keep all follow-up visits as told by your health care provider. This is important. °How is this prevented? ° °There is no vaccine to help prevent COVID-19 infection. However, there are steps you can take to protect yourself and others from this virus. °To protect yourself:  °· Do not travel to areas where COVID-19 is a risk. The areas where COVID-19 is reported change often. To identify  high-risk areas and travel restrictions, check the CDC travel website: wwwnc.cdc.gov/travel/notices °· If you live in, or must travel to, an area where COVID-19 is a risk, take precautions to avoid infection. °? Stay away from people who are sick. °? Wash your hands often with soap and water for 20 seconds. If soap and water are not available, use an alcohol-based hand sanitizer. °? Avoid touching your mouth, face, eyes, or nose. °? Avoid going out in public, follow guidance from your state and local health authorities. °? If you must go out in public, wear a cloth face covering or face mask. °? Disinfect objects and surfaces that are frequently touched every day. This may include: °§ Counters and tables. °§ Doorknobs and light switches. °§ Sinks and faucets. °§ Electronics, such as phones, remote controls, keyboards, computers, and tablets. °To protect others: °If you have symptoms of COVID-19, take steps to prevent the virus from spreading to others. °· If you think you have a COVID-19 infection, contact your health care provider right away. Tell your health care team that you think you may   have a COVID-19 infection. °· Stay home. Leave your house only to seek medical care. Do not use public transport. °· Do not travel while you are sick. °· Wash your hands often with soap and water for 20 seconds. If soap and water are not available, use alcohol-based hand sanitizer. °· Stay away from other members of your household. Let healthy household members care for children and pets, if possible. If you have to care for children or pets, wash your hands often and wear a mask. If possible, stay in your own room, separate from others. Use a different bathroom. °· Make sure that all people in your household wash their hands well and often. °· Cough or sneeze into a tissue or your sleeve or elbow. Do not cough or sneeze into your hand or into the air. °· Wear a cloth face covering or face mask. °Where to find more  information °· Centers for Disease Control and Prevention: www.cdc.gov/coronavirus/2019-ncov/index.html °· World Health Organization: www.who.int/health-topics/coronavirus °Contact a health care provider if: °· You live in or have traveled to an area where COVID-19 is a risk and you have symptoms of the infection. °· You have had contact with someone who has COVID-19 and you have symptoms of the infection. °Get help right away if: °· You have trouble breathing. °· You have pain or pressure in your chest. °· You have confusion. °· You have bluish lips and fingernails. °· You have difficulty waking from sleep. °· You have symptoms that get worse. °These symptoms may represent a serious problem that is an emergency. Do not wait to see if the symptoms will go away. Get medical help right away. Call your local emergency services (911 in the [place] Do not drive yourself to the hospital. Let the emergency medical personnel know if you think you have COVID-19. °Summary °· COVID-19 is a respiratory infection that is caused by a virus. It is also known as coronavirus disease or novel coronavirus. It can cause serious infections, such as pneumonia, acute respiratory distress syndrome, acute respiratory failure, or sepsis. °· The virus that causes COVID-19 is contagious. This means that it can spread from person to person through droplets from coughs and sneezes. °· You are more likely to develop a serious illness if you are 65 years of age or older, have a weak immunity, live in a nursing home, or have chronic disease. °· There is no medicine to treat COVID-19. Your health care provider will talk with you about ways to treat your symptoms. °· Take steps to protect yourself and others from infection. Wash your hands often and disinfect objects and surfaces that are frequently touched every day. Stay away from people who are sick and wear a mask if you are sick. °This information is not intended to replace advice given to you by  your health care provider. Make sure you discuss any questions you have with your health care provider. °Document Released: 01/12/2019 Document Revised: 05/04/2019 Document Reviewed: 01/12/2019 °Elsevier Patient Education © 2020 [employer] ° °COVID-19: How to Protect Yourself and Others °Know how it spreads °· There is currently no vaccine to prevent coronavirus disease 2019 (COVID-19). °· The best way to prevent illness is to avoid being exposed to this virus. °· The virus is thought to spread mainly from person-to-person. °? Between people who are in close contact with one another (within about 6 feet). °? Through respiratory droplets produced when an infected person coughs, sneezes or talks. °? These droplets can land in   the mouths or noses of people who are nearby or possibly be inhaled into the lungs. °? Some recent studies have suggested that COVID-19 may be spread by people who are not showing symptoms. °Everyone should °Clean your hands often °· Wash your hands often with soap and water for at least 20 seconds especially after you have been in a public place, or after blowing your nose, coughing, or sneezing. °· If soap and water are not readily available, use a hand sanitizer that contains at least 60% alcohol. Cover all surfaces of your hands and rub them together until they feel dry. °· Avoid touching your eyes, nose, and mouth with unwashed hands. °Avoid close contact °· Stay home if you are sick. °· Avoid close contact with people who are sick. °· Put distance between yourself and other people. °? Remember that some people without symptoms may be able to spread virus. °? This is especially important for people who are at higher risk of getting very sick.www.cdc.gov/coronavirus/2019-ncov/need-extra-precautions/people-at-higher-risk.html °Cover your mouth and nose with a cloth face cover when around others °· You could spread COVID-19 to others even if you do not feel sick. °· Everyone should wear a  cloth face cover when they have to go out in public, for example to the grocery store or to pick up other necessities. °? Cloth face coverings should not be placed on young children under age 2, anyone who has trouble breathing, or is unconscious, incapacitated or otherwise unable to remove the mask without assistance. °· The cloth face cover is meant to protect other people in case you are infected. °· Do NOT use a facemask meant for a healthcare worker. °· Continue to keep about 6 feet between yourself and others. The cloth face cover is not a substitute for social distancing. °Cover coughs and sneezes °· If you are in a private setting and do not have on your cloth face covering, remember to always cover your mouth and nose with a tissue when you cough or sneeze or use the inside of your elbow. °· Throw used tissues in the trash. °· Immediately wash your hands with soap and water for at least 20 seconds. If soap and water are not readily available, clean your hands with a hand sanitizer that contains at least 60% alcohol. °Clean and disinfect °· Clean AND disinfect frequently touched surfaces daily. This includes tables, doorknobs, light switches, countertops, handles, desks, phones, keyboards, toilets, faucets, and sinks. www.cdc.gov/coronavirus/2019-ncov/prevent-getting-sick/disinfecting-your-home.html °· If surfaces are dirty, clean them: Use detergent or soap and water prior to disinfection. °· Then, use a household disinfectant. You can see a list of EPA-registered household disinfectants here. °cdc.gov/coronavirus °04/25/2019 °This information is not intended to replace advice given to you by your health care provider. Make sure you discuss any questions you have with your health care provider. °Document Released: 04/04/2019 Document Revised: 05/03/2019 Document Reviewed: 04/04/2019 °Elsevier Patient Education © 2020 [employer] ° ° °COVID-19 Frequently Asked Questions °COVID-19 (coronavirus disease) is  an infection that is caused by a large family of viruses. Some viruses cause illness in people and others cause illness in animals like camels, cats, and bats. In some cases, the viruses that cause illness in animals can spread to humans. °Where did the coronavirus come from? °In December 2019, [place] told the World Health Organization (WHO) of several cases of lung disease (human respiratory illness). These cases were linked to an open seafood and livestock market in the city of [place]. The link to the seafood and   livestock market suggests that the virus may have spread from animals to humans. However, since that first outbreak in December, the virus has also been shown to spread from person to person. °What is the name of the disease and the virus? °Disease name °Early on, this disease was called novel coronavirus. This is because scientists determined that the disease was caused by a new (novel) respiratory virus. The World Health Organization (WHO) has now named the disease COVID-19, or coronavirus disease. °Virus name °The virus that causes the disease is called severe acute respiratory syndrome coronavirus 2 (SARS-CoV-2). °More information on disease and virus naming °World Health Organization (WHO): www.who.int/emergencies/diseases/novel-coronavirus-2019/technical-guidance/naming-the-coronavirus-disease-(covid-2019)-and-the-virus-that-causes-it °Who is at risk for complications from coronavirus disease? °Some people may be at higher risk for complications from coronavirus disease. This includes older adults and people who have chronic diseases, such as heart disease, diabetes, and lung disease. °If you are at higher risk for complications, take these extra precautions: °· Avoid close contact with people who are sick or have a fever or cough. Stay at least 3-6 ft (1-2 m) away from them, if possible. °· Wash your hands often with soap and water for at least 20 seconds. °· Avoid touching your face, mouth, nose, or  eyes. °· Keep supplies on hand at home, such as food, medicine, and cleaning supplies. °· Stay home as much as possible. °· Avoid social gatherings and travel. °How does coronavirus disease spread? °The virus that causes coronavirus disease spreads easily from person to person (is contagious). There are also cases of community-spread disease. This means the disease has spread to: °· People who have no known contact with other infected people. °· People who have not traveled to areas where there are known cases. °It appears to spread from one person to another through droplets from coughing or sneezing. °Can I get the virus from touching surfaces or objects? °There is still a lot that we do not know about the virus that causes coronavirus disease. Scientists are basing a lot of information on what they know about similar viruses, such as: °· Viruses cannot generally survive on surfaces for long. They need a human body (host) to survive. °· It is more likely that the virus is spread by close contact with people who are sick (direct contact), such as through: °? Shaking hands or hugging. °? Breathing in respiratory droplets that travel through the air. This can happen when an infected person coughs or sneezes on or near other people. °· It is less likely that the virus is spread when a person touches a surface or object that has the virus on it (indirect contact). The virus may be able to enter the body if the person touches a surface or object and then touches his or her face, eyes, nose, or mouth. °Can a person spread the virus without having symptoms of the disease? °It may be possible for the virus to spread before a person has symptoms of the disease, but this is most likely not the main way the virus is spreading. It is more likely for the virus to spread by being in close contact with people who are sick and breathing in the respiratory droplets of a sick person's cough or sneeze. °What are the symptoms of  coronavirus disease? °Symptoms vary from person to person and can range from mild to severe. Symptoms may include: °· Fever. °· Cough. °· Tiredness, weakness, or fatigue. °· Fast breathing or feeling short of breath. °These symptoms can appear anywhere from   2 to 14 days after you have been exposed to the virus. If you develop symptoms, call your health care provider. People with severe symptoms may need hospital care. °If I am exposed to the virus, how long does it take before symptoms start? °Symptoms of coronavirus disease may appear anywhere from 2 to 14 days after a person has been exposed to the virus. If you develop symptoms, call your health care provider. °Should I be tested for this virus? °Your health care provider will decide whether to test you based on your symptoms, history of exposure, and your risk factors. °How does a health care provider test for this virus? °Health care providers will collect samples to send for testing. Samples may include: °· Taking a swab of fluid from the nose. °· Taking fluid from the lungs by having you cough up mucus (sputum) into a sterile cup. °· Taking a blood sample. °· Taking a stool or urine sample. °Is there a treatment or vaccine for this virus? °Currently, there is no vaccine to prevent coronavirus disease. Also, there are no medicines like antibiotics or antivirals to treat the virus. A person who becomes sick is given supportive care, which means rest and fluids. A person may also relieve his or her symptoms by using over-the-counter medicines that treat sneezing, coughing, and runny nose. These are the same medicines that a person takes for the common cold. °If you develop symptoms, call your health care provider. People with severe symptoms may need hospital care. °What can I do to protect myself and my family from this virus? ° °  ° °You can protect yourself and your family by taking the same actions that you would take to prevent the spread of other viruses.  Take the following actions: °· Wash your hands often with soap and water for at least 20 seconds. If soap and water are not available, use alcohol-based hand sanitizer. °· Avoid touching your face, mouth, nose, or eyes. °· Cough or sneeze into a tissue, sleeve, or elbow. Do not cough or sneeze into your hand or the air. °? If you cough or sneeze into a tissue, throw it away immediately and wash your hands. °· Disinfect objects and surfaces that you frequently touch every day. °· Avoid close contact with people who are sick or have a fever or cough. Stay at least 3-6 ft (1-2 m) away from them, if possible. °· Stay home if you are sick, except to get medical care. Call your health care provider before you get medical care. °· Make sure your vaccines are up to date. Ask your health care provider what vaccines you need. °What should I do if I need to travel? °Follow travel recommendations from your local health authority, the CDC, and WHO. °Travel information and advice °· Centers for Disease Control and Prevention (CDC): www.cdc.gov/coronavirus/2019-ncov/travelers/index.html °· World Health Organization (WHO): www.who.int/emergencies/diseases/novel-coronavirus-2019/travel-advice °Know the risks and take action to protect your health °· You are at higher risk of getting coronavirus disease if you are traveling to areas with an outbreak or if you are exposed to travelers from areas with an outbreak. °· Wash your hands often and practice good hygiene to lower the risk of catching or spreading the virus. °What should I do if I am sick? °General instructions to stop the spread of infection °· Wash your hands often with soap and water for at least 20 seconds. If soap and water are not available, use alcohol-based hand sanitizer. °· Cough or sneeze into a   tissue, sleeve, or elbow. Do not cough or sneeze into your hand or the air. °· If you cough or sneeze into a tissue, throw it away immediately and wash your hands. °· Stay  home unless you must get medical care. Call your health care provider or local health authority before you get medical care. °· Avoid public areas. Do not take public transportation, if possible. °· If you can, wear a mask if you must go out of the house or if you are in close contact with someone who is not sick. °Keep your home clean °· Disinfect objects and surfaces that are frequently touched every day. This may include: °? Counters and tables. °? Doorknobs and light switches. °? Sinks and faucets. °? Electronics such as phones, remote controls, keyboards, computers, and tablets. °· Wash dishes in hot, soapy water or use a dishwasher. Air-dry your dishes. °· Wash laundry in hot water. °Prevent infecting other household members °· Let healthy household members care for children and pets, if possible. If you have to care for children or pets, wash your hands often and wear a mask. °· Sleep in a different bedroom or bed, if possible. °· Do not share personal items, such as razors, toothbrushes, deodorant, combs, brushes, towels, and washcloths. °Where to find more information °Centers for Disease Control and Prevention (CDC) °· Information and news updates: www.cdc.gov/coronavirus/2019-ncov °World Health Organization (WHO) °· Information and news updates: www.who.int/emergencies/diseases/novel-coronavirus-2019 °· Coronavirus health topic: www.who.int/health-topics/coronavirus °· Questions and answers on COVID-19: www.who.int/news-room/q-a-detail/q-a-coronaviruses °· Global tracker: who.sprinklr.com °American Academy of Pediatrics (AAP) °· Information for families: www.healthychildren.org/English/health-issues/conditions/chest-lungs/Pages/2019-Novel-Coronavirus.aspx °The coronavirus situation is changing rapidly. Check your local health authority website or the CDC and WHO websites for updates and news. °When should I contact a health care provider? °· Contact your health care provider if you have symptoms of an  infection, such as fever or cough, and you: °? Have been near anyone who is known to have coronavirus disease. °? Have come into contact with a person who is suspected to have coronavirus disease. °? Have traveled outside of the country. °When should I get emergency medical care? °· Get help right away by calling your local emergency services (911 in the [place] if you have: °? Trouble breathing. °? Pain or pressure in your chest. °? Confusion. °? Blue-tinged lips and fingernails. °? Difficulty waking from sleep. °? Symptoms that get worse. °Let the emergency medical personnel know if you think you have coronavirus disease. °Summary °· A new respiratory virus is spreading from person to person and causing COVID-19 (coronavirus disease). °· The virus that causes COVID-19 appears to spread easily. It spreads from one person to another through droplets from coughing or sneezing. °· Older adults and those with chronic diseases are at higher risk of disease. If you are at higher risk for complications, take extra precautions. °· There is currently no vaccine to prevent coronavirus disease. There are no medicines, such as antibiotics or antivirals, to treat the virus. °· You can protect yourself and your family by washing your hands often, avoiding touching your face, and covering your coughs and sneezes. °This information is not intended to replace advice given to you by your health care provider. Make sure you discuss any questions you have with your health care provider. °Document Released: 04/04/2019 Document Revised: 04/04/2019 Document Reviewed: 04/04/2019 °Elsevier Patient Education © 2020 Elsevier Inc. ° °

## 2019-11-20 NOTE — Discharge Summary (Signed)
[facility] NAME: Ralph Ware    MR#:  [id number]  DATE OF BIRTH:  08-22-1956  DATE OF ADMISSION:  11/16/2019   ADMITTING PHYSICIAN: Edwin Dada, MD  DATE OF DISCHARGE: 11/20/2019 11:33 AM  PRIMARY CARE PHYSICIAN: Josetta Huddle, MD   ADMISSION DIAGNOSIS:  Hypoxia [R09.02] COVID-19 [U07.1] DISCHARGE DIAGNOSIS:  Principal Problem:   Pneumonia due to COVID-19 virus Active Problems:   Obstructive sleep apnea   Essential hypertension   Seasonal and perennial allergic rhinitis   Hypokalemia   Transaminitis   COVID-19   Hypoxia  SECONDARY DIAGNOSIS:   Past Medical History:  Diagnosis Date  . Allergic rhinitis   . Hyperlipidemia   . Hypertension   . OSA (obstructive sleep apnea)    HOSPITAL COURSE:   COVID-19 pneumonia with acute hypoxic respiratory failure Improved with IV remdesevir and steroids. On room air now. Being D/Ced on steroid taper, Vit C & Zinc.  Explained home COVID monitoring, quarantine and outpt pulmo f/up plans  Transaminitis From Covid -improved  Hypokalemia - repleted and resolved  Hypertension Blood pressure controlled on home regimen DISCHARGE CONDITIONS:  stable CONSULTS OBTAINED:   DRUG ALLERGIES:  No Known Allergies DISCHARGE MEDICATIONS:   Allergies as of 11/20/2019   No Known Allergies     Medication List    TAKE these medications   amLODipine 2.5 MG tablet Commonly known as: NORVASC Take 2.5 mg by mouth every evening.   AzaSite 1 % ophthalmic solution Generic drug: azithromycin   Azelastine HCl 0.15 % Soln Commonly known as: Astepro Place 2 sprays into the nose 2 (two) times daily as needed.   CoQ10 100 MG Caps Take 1 capsule by mouth daily.   diphenhydrAMINE 25 MG tablet Commonly known as: BENADRYL Take 25 mg by mouth at bedtime as needed.   levocetirizine 5 MG tablet Commonly known as: XYZAL 1 daily for allergy What changed:   how much to take  how to  take this  when to take this  additional instructions   losartan-hydrochlorothiazide 100-12.5 MG tablet Commonly known as: HYZAAR Take 1 tablet by mouth every evening.   Magnesium 250 MG Tabs Take 1 tablet by mouth daily.   omeprazole 20 MG capsule Commonly known as: PRILOSEC Take 20 mg by mouth daily as needed.   predniSONE 10 MG (21) Tbpk tablet Commonly known as: STERAPRED UNI-PAK 21 TAB Start 60 mg once daily, taper 10 mg daily until finished   simvastatin 40 MG tablet Commonly known as: ZOCOR Take 40 mg by mouth 4 (four) times a week.   vitamin C 1000 MG tablet Take 1,000 mg by mouth daily.   Vitamin D 1000 units capsule Take 1,000 Units by mouth daily.   zinc gluconate 50 MG tablet Take 50 mg by mouth daily.        DISCHARGE INSTRUCTIONS:   DIET:  Regular diet DISCHARGE CONDITION:  Good ACTIVITY:  Activity as tolerated OXYGEN:  Home Oxygen: No.  Oxygen Delivery: room air DISCHARGE LOCATION:  home   If you experience worsening of your admission symptoms, develop shortness of breath, life threatening emergency, suicidal or homicidal thoughts you must seek medical attention immediately by calling 911 or calling your MD immediately  if symptoms less severe.  You Must read complete instructions/literature along with all the possible adverse reactions/side effects for all the Medicines you take and that have been prescribed to you. Take any new Medicines after you have completely understood and accpet  all the possible adverse reactions/side effects.   Please note  You were cared for by a hospitalist during your hospital stay. If you have any questions about your discharge medications or the care you received while you were in the hospital after you are discharged, you can call the unit and asked to speak with the hospitalist on call if the hospitalist that took care of you is not available. Once you are discharged, your primary care physician will handle any  further medical issues. Please note that NO REFILLS for any discharge medications will be authorized once you are discharged, as it is imperative that you return to your primary care physician (or establish a relationship with a primary care physician if you do not have one) for your aftercare needs so that they can reassess your need for medications and monitor your lab values.    On the day of Discharge:  VITAL SIGNS:  Blood pressure 137/76, pulse 62, temperature 97.7 F (36.5 C), temperature source Axillary, resp. rate 18, height 5\' 6"  (1.676 m), weight 104.4 kg, SpO2 90 %. PHYSICAL EXAMINATION:  GENERAL:  63 y.o.-year-old patient lying in the bed with no acute distress.  EYES: Pupils equal, round, reactive to light and accommodation. No scleral icterus. Extraocular muscles intact.  HEENT: Head atraumatic, normocephalic. Oropharynx and nasopharynx clear.  NECK:  Supple, no jugular venous distention. No thyroid enlargement, no tenderness.  LUNGS: Normal breath sounds bilaterally, no wheezing, rales,rhonchi or crepitation. No use of accessory muscles of respiration.  CARDIOVASCULAR: S1, S2 normal. No murmurs, rubs, or gallops.  ABDOMEN: Soft, non-tender, non-distended. Bowel sounds present. No organomegaly or mass.  EXTREMITIES: No pedal edema, cyanosis, or clubbing.  NEUROLOGIC: Cranial nerves II through XII are intact. Muscle strength 5/5 in all extremities. Sensation intact. Gait not checked.  PSYCHIATRIC: The patient is alert and oriented x 3.  SKIN: No obvious rash, lesion, or ulcer.  DATA REVIEW:   CBC Recent Labs  Lab 11/20/19 0609  WBC 13.5*  HGB 14.3  HCT 42.9  PLT 286    Chemistries  Recent Labs  Lab 11/17/19 1001  11/20/19 0609  NA 139   < > 139  K 3.3*   < > 4.1  CL 102   < > 102  CO2 25   < > 25  GLUCOSE 147*   < > 160*  BUN 19   < > 22  CREATININE 0.63   < > 0.63  CALCIUM 8.3*   < > 8.5*  MG 2.3  --   --   AST 52*   < > 25  ALT 60*   < > 33  ALKPHOS 46    < > 47  BILITOT 0.9   < > 0.8   < > = values in this interval not displayed.     Microbiology Results   COVID + on 11/17/2019  Management plans discussed with the patient, family and they are in agreement.  CODE STATUS: Prior   TOTAL TIME TAKING CARE OF THIS PATIENT: 45 minutes.    11/19/2019 M.D on 11/20/2019 at 8:05 PM  Between 7am to 6pm - Pager - [phone]  After 6pm go to [email or web address] - password [email or web address]  [facility]   CC: Primary care physician; [phone], MD   Note: This dictation was prepared with Dragon dictation along with smaller phrase technology. Any transcriptional errors that result from this process are unintentional.

## 2019-11-22 ENCOUNTER — Encounter (INDEPENDENT_AMBULATORY_CARE_PROVIDER_SITE_OTHER): Payer: Self-pay

## 2019-11-23 ENCOUNTER — Encounter (INDEPENDENT_AMBULATORY_CARE_PROVIDER_SITE_OTHER): Payer: Self-pay

## 2019-11-24 ENCOUNTER — Encounter (INDEPENDENT_AMBULATORY_CARE_PROVIDER_SITE_OTHER): Payer: Self-pay

## 2019-11-25 ENCOUNTER — Encounter (INDEPENDENT_AMBULATORY_CARE_PROVIDER_SITE_OTHER): Payer: Self-pay

## 2019-11-26 ENCOUNTER — Encounter (INDEPENDENT_AMBULATORY_CARE_PROVIDER_SITE_OTHER): Payer: Self-pay

## 2019-11-27 ENCOUNTER — Encounter (INDEPENDENT_AMBULATORY_CARE_PROVIDER_SITE_OTHER): Payer: Self-pay

## 2019-11-28 ENCOUNTER — Encounter (INDEPENDENT_AMBULATORY_CARE_PROVIDER_SITE_OTHER): Payer: Self-pay

## 2019-11-29 ENCOUNTER — Encounter (INDEPENDENT_AMBULATORY_CARE_PROVIDER_SITE_OTHER): Payer: Self-pay

## 2019-11-30 ENCOUNTER — Other Ambulatory Visit: Payer: Self-pay | Admitting: Internal Medicine

## 2019-11-30 ENCOUNTER — Encounter (INDEPENDENT_AMBULATORY_CARE_PROVIDER_SITE_OTHER): Payer: Self-pay

## 2019-11-30 DIAGNOSIS — J1282 Pneumonia due to coronavirus disease 2019: Secondary | ICD-10-CM

## 2019-11-30 DIAGNOSIS — J1289 Other viral pneumonia: Secondary | ICD-10-CM

## 2019-12-01 ENCOUNTER — Encounter (INDEPENDENT_AMBULATORY_CARE_PROVIDER_SITE_OTHER): Payer: Self-pay

## 2019-12-02 ENCOUNTER — Encounter (INDEPENDENT_AMBULATORY_CARE_PROVIDER_SITE_OTHER): Payer: Self-pay

## 2019-12-03 ENCOUNTER — Encounter (INDEPENDENT_AMBULATORY_CARE_PROVIDER_SITE_OTHER): Payer: Self-pay

## 2019-12-04 ENCOUNTER — Telehealth: Payer: Self-pay | Admitting: *Deleted

## 2019-12-04 NOTE — Telephone Encounter (Signed)
Received my chart from patient: Dr. Leta Baptist, I'm sorry to be bothering you with this, but as far as I can tell [facility] MyChart does not allow me to address this to anybody else. MyChart keeps telling me I have four health tasks that are overdue: Hepatitis C screening Tetanus Vaccine Colon Cancer Screening Pneumonia vaccines I got a pneumovax injection on Aug. 19, 2020, from my primary care physician, who is not in the [facility] system. As for the other three, these are also all being followed by my primary care physician. They are, quite frankly, none of [place]'s business. Could you please remove these items from my To Do list on MyChart?  I replied and gave him # to my chart customer support.

## 2019-12-19 ENCOUNTER — Ambulatory Visit: Payer: Managed Care, Other (non HMO) | Admitting: Diagnostic Neuroimaging

## 2019-12-27 ENCOUNTER — Ambulatory Visit (INDEPENDENT_AMBULATORY_CARE_PROVIDER_SITE_OTHER): Payer: Managed Care, Other (non HMO) | Admitting: Diagnostic Neuroimaging

## 2019-12-27 ENCOUNTER — Encounter: Payer: Self-pay | Admitting: Diagnostic Neuroimaging

## 2019-12-27 ENCOUNTER — Other Ambulatory Visit: Payer: Self-pay

## 2019-12-27 VITALS — BP 140/88 | HR 69 | Temp 97.0°F | Ht 66.0 in | Wt 234.2 lb

## 2019-12-27 DIAGNOSIS — M542 Cervicalgia: Secondary | ICD-10-CM

## 2019-12-27 DIAGNOSIS — M79602 Pain in left arm: Secondary | ICD-10-CM | POA: Diagnosis not present

## 2019-12-27 DIAGNOSIS — R200 Anesthesia of skin: Secondary | ICD-10-CM | POA: Diagnosis not present

## 2019-12-27 DIAGNOSIS — M79601 Pain in right arm: Secondary | ICD-10-CM

## 2019-12-27 NOTE — Progress Notes (Signed)
[facility]  PATIENT: Ralph Ware DOB: Jul 14, 1956  REFERRING CLINICIAN: Marden Noble, MD HISTORY FROM: patient  REASON FOR VISIT: new consult    HISTORICAL  CHIEF COMPLAINT:  Chief Complaint  Patient presents with  . Pain    rm 6 New Pt, "long term pain in right shoulder, sometimes extending into fingertips, occas numbness"    HISTORY OF PRESENT ILLNESS:   64 year old male here for evaluation of right arm pain and numbness. Summer 2019 patient woke up and felt pain shooting from his right shoulder down to his right hand. He was having intense pain, burning sensation in the fingertips, numbness. Symptoms have fluctuated since that time. Sometimes symptoms radiate into his left arm. He feels popping and cracking in his neck. Symptoms have been aggravated in the past by some heavy lifting. He notices numbness in all fingers, but more in digits 1-3.  No problems with feet or legs. No problems with low back. No speech or swallowing problems. No vision changes.  Patient was hospitalized for Covid at the end of November 2020. He has made a full recovery.    REVIEW OF SYSTEMS: Full 14 system review of systems performed and negative with exception of: Numbness allergies. Hypertension hypercholesterolemia.  ALLERGIES: Allergies  Allergen Reactions  . Molds & Smuts     And pollen    HOME MEDICATIONS: Outpatient Medications Prior to Visit  Medication Sig Dispense Refill  . amLODipine (NORVASC) 2.5 MG tablet Take 2.5 mg by mouth every evening.   2  . Ascorbic Acid (VITAMIN C) 1000 MG tablet Take 1,000 mg by mouth daily.      Marland Kitchen aspirin EC 81 MG tablet Take 81 mg by mouth daily.    . Cholecalciferol (VITAMIN D) 1000 UNITS capsule Take 1,000 Units by mouth daily.      . Coenzyme Q10 (COQ10) 100 MG CAPS Take 1 capsule by mouth daily.      . diphenhydrAMINE (BENADRYL) 25 MG tablet Take 25 mg by mouth at bedtime as needed.      Marland Kitchen levocetirizine (XYZAL) 5 MG tablet  1 daily for allergy (Patient taking differently: Take 2.5 mg by mouth daily. ) 90 tablet 3  . losartan-hydrochlorothiazide (HYZAAR) 100-12.5 MG per tablet Take 1 tablet by mouth every evening.     . Magnesium 250 MG TABS Take 1 tablet by mouth daily.      . simvastatin (ZOCOR) 40 MG tablet Take 40 mg by mouth 4 (four) times a week.     . zinc gluconate 50 MG tablet Take 50 mg by mouth daily.      . AZASITE 1 % ophthalmic solution     . Azelastine HCl (ASTEPRO) 0.15 % SOLN Place 2 sprays into the nose 2 (two) times daily as needed. 30 mL prn  . omeprazole (PRILOSEC) 20 MG capsule Take 20 mg by mouth daily as needed.   2  . predniSONE (STERAPRED UNI-PAK 21 TAB) 10 MG (21) TBPK tablet Start 60 mg once daily, taper 10 mg daily until finished 21 tablet 0   No facility-administered medications prior to visit.    PAST MEDICAL HISTORY: Past Medical History:  Diagnosis Date  . Allergic rhinitis   . Hyperlipidemia   . Hypertension   . OSA (obstructive sleep apnea)     PAST SURGICAL HISTORY: Past Surgical History:  Procedure Laterality Date  . NASAL SEPTUM SURGERY     x2    FAMILY HISTORY: Family History  Problem Relation Age of  Onset  . Breast cancer Mother   . Bradycardia Father        with pacemaker  . Allergic rhinitis Neg Hx   . Angioedema Neg Hx   . Asthma Neg Hx   . Atopy Neg Hx   . Eczema Neg Hx   . Immunodeficiency Neg Hx   . Urticaria Neg Hx     SOCIAL HISTORY: Social History   Socioeconomic History  . Marital status: Married    Spouse name: Not on file  . Number of children: 0  . Years of education: grad school  . Highest education level: Not on file  Occupational History  . 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 Smoking status: Never Smoker  . Smokeless tobacco: Never Used  Substance and Sexual Activity  . Alcohol use: Yes    Alcohol/week: 5.0 standard drinks    Types: 5 Glasses of wine per week    Comment: 4 drinks a week    . Drug use: No  . Sexual activity: Not on file  Other Topics Concern  . Not on file  Social History Narrative   Lives with wife   Caffeine, diet coke, 1-2 a week   Social Determinants of Health   Financial Resource Strain: Low Risk   . Difficulty of Paying Living Expenses: Not hard at all  Food Insecurity: No Food Insecurity  . Worried About Programme researcher, broadcasting/film/video in the Last Year: Never true  . Ran Out of Food in the Last Year: Never true  Transportation Needs: No Transportation Needs  . Lack of Transportation (Medical): No  . Lack of Transportation (Non-Medical): No  Physical Activity: Insufficiently Active  . Days of Exercise per Week: 1 day  . Minutes of Exercise per Session: 120 min  Stress: No Stress Concern Present  . Feeling of Stress : Not at all  Social Connections: Somewhat Isolated  . Frequency of Communication with Friends and Family: Three times a week  . Frequency of Social Gatherings with Friends and Family: Three times a week  . Attends Religious Services: Never  . Active Member of Clubs or Organizations: No  . Attends Banker Meetings: Never  . Marital Status: Married  Catering manager Violence: Not At Risk  . Fear of Current or Ex-Partner: No  . Emotionally Abused: No  . Physically Abused: No  . Sexually Abused: No     PHYSICAL EXAM  GENERAL EXAM/CONSTITUTIONAL: Vitals:  Vitals:   12/27/19 0835  BP: 140/88  Pulse: 69  Temp: (!) 97 F (36.1 C)  Weight: 234 lb 3.2 oz (106.2 kg)  Height: 5\' 6"  (1.676 m)     Body mass index is 37.8 kg/m. Wt Readings from Last 3 Encounters:  12/27/19 234 lb 3.2 oz (106.2 kg)  11/20/19 230 lb 3.2 oz (104.4 kg)  07/14/18 236 lb (107 kg)     Patient is in no distress; well developed, nourished and groomed; NECK ROTATION SLIGHTLY TENDER; SPURLING NEGATIVE  CARDIOVASCULAR:  Examination of carotid arteries is normal; no carotid bruits  Regular rate and rhythm, no murmurs  Examination of  peripheral vascular system by observation and palpation is normal  EYES:  Ophthalmoscopic exam of optic discs and posterior segments is normal; no papilledema or hemorrhages  No exam data present  MUSCULOSKELETAL:  Gait, strength, tone, movements noted in Neurologic exam below  NEUROLOGIC: MENTAL STATUS:  No flowsheet data found.  awake, alert, oriented to person, place  and time  recent and remote memory intact  normal attention and concentration  language fluent, comprehension intact, naming intact  fund of knowledge appropriate  CRANIAL NERVE:   2nd - no papilledema on fundoscopic exam  2nd, 3rd, 4th, 6th - pupils equal and reactive to light, visual fields full to confrontation, extraocular muscles intact, no nystagmus  5th - facial sensation symmetric  7th - facial strength symmetric  8th - hearing intact  9th - palate elevates symmetrically, uvula midline  11th - shoulder shrug symmetric  12th - tongue protrusion midline  MOTOR:   normal bulk and tone, full strength in the BUE, BLE  SENSORY:   normal and symmetric to light touch, temperature, vibration; DECR AT TOES  COORDINATION:   finger-nose-finger, fine finger movements normal  REFLEXES:   deep tendon reflexes 2+ and symmetric; TRACE AT ANKLES  GAIT/STATION:   narrow based gait; DIFF WITH TANDEM; ROMBERG NEG     DIAGNOSTIC DATA (LABS, IMAGING, TESTING) - I reviewed patient records, labs, notes, testing and imaging myself where available.  Lab Results  Component Value Date   WBC 13.5 (H) 11/20/2019   HGB 14.3 11/20/2019   HCT 42.9 11/20/2019   MCV 86.1 11/20/2019   PLT 286 11/20/2019      Component Value Date/Time   NA 139 11/20/2019 0609   K 4.1 11/20/2019 0609   CL 102 11/20/2019 0609   CO2 25 11/20/2019 0609   GLUCOSE 160 (H) 11/20/2019 0609   BUN 22 11/20/2019 0609   CREATININE 0.63 11/20/2019 0609   CALCIUM 8.5 (L) 11/20/2019 0609   PROT 6.5 11/20/2019 0609   ALBUMIN  3.0 (L) 11/20/2019 0609   AST 25 11/20/2019 0609   ALT 33 11/20/2019 0609   ALKPHOS 47 11/20/2019 0609   BILITOT 0.8 11/20/2019 0609   GFRNONAA >60 11/20/2019 0609   GFRAA >60 11/20/2019 0609   No results found for: CHOL, HDL, LDLCALC, LDLDIRECT, TRIG, CHOLHDL No results found for: HGBA1C No results found for: VITAMINB12 No results found for: TSH     ASSESSMENT AND PLAN  64 y.o. year old male here with new onset of pain, numbness, tingling in bilateral upper extremities, associated with neck pain. May represent cervical radiculopathy. Patient has tried prednisone pack with temporary relief. Symptom onset summer 2019.  Dx:  1. Pain in both upper extremities   2. Hand numbness   3. Neck pain     PLAN:  ARM NUMBNESS / NECK PAIN (since 2019) - check MRI cervical spine (tried and failed conservative mgmt; prednisone) - may consider PT/OT - may consider EMG/NCS  Orders Placed This Encounter  Procedures  . MR CERVICAL SPINE WO CONTRAST   Return pending test results, for pending if symptoms worsen or fail to improve.    Penni Bombard, MD 12/23/7515, 0:01 AM Certified in Neurology, Neurophysiology and Neuroimaging  [facility] [address] [place], [place] [address] [phone]

## 2020-01-05 ENCOUNTER — Other Ambulatory Visit: Payer: Managed Care, Other (non HMO)

## 2020-01-09 ENCOUNTER — Telehealth: Payer: Self-pay

## 2020-01-09 DIAGNOSIS — M542 Cervicalgia: Secondary | ICD-10-CM

## 2020-01-09 NOTE — Telephone Encounter (Signed)
Called [facility], spoke with Candace B, Case #[id number] for MRI cervical spine w/o contrast.  It was denied because request  must meet either:  1.  patient did not improve after recent 6 week trial of MD prescribed treatment documented during follow up contact 2. patient has signs or  symptoms that suggest severe problem for which trial of treatment is not needed Scheduled Peer to peer Jan 11, 2020, Thurs 11 am EST, Dr Shirleen Schirmer. She will call Dr Richrd Humbles mobile #. If her call is missed, MD may call [employer] at  [phone], opt 4.

## 2020-01-09 NOTE — Telephone Encounter (Signed)
Per [facility] the patients MRI was denied please call the number below to complete P2P.   [facility] [phone] RAQT#[id number]

## 2020-01-11 NOTE — Telephone Encounter (Signed)
LVM informing patient his insurance denied MRI cervical spine even with peer to peer by Dr Marjory Lies today. I requested he call back for Dr [facility] recommendations. Left #.

## 2020-01-11 NOTE — Telephone Encounter (Signed)
Patient returned call and I advised him of Dr Richrd Humbles recommendations. He agreed to PT and would like to have it in [place]. I advised will let referral team know. Patient verbalized understanding, appreciation.

## 2020-01-11 NOTE — Telephone Encounter (Signed)
Recommend PT evaluation; OTC tylenol / ibuprofen for pain. Re-eval in 6 weeks by phone; then may re-order MRI.  Orders Placed This Encounter  Procedures  . Ambulatory referral to Physical Therapy    Suanne Marker, MD 01/11/2020, 11:14 AM Certified in Neurology, Neurophysiology and Neuroimaging  [facility] [address], [address] [place], [place] [address] [phone]

## 2020-01-16 NOTE — Telephone Encounter (Signed)
Referral has been sent to [facility] . [facility] will call patient to schedule. I called and left patient a message with details.

## 2020-02-06 ENCOUNTER — Ambulatory Visit: Payer: Managed Care, Other (non HMO) | Attending: Diagnostic Neuroimaging

## 2020-02-06 ENCOUNTER — Other Ambulatory Visit: Payer: Self-pay

## 2020-02-06 DIAGNOSIS — M25511 Pain in right shoulder: Secondary | ICD-10-CM | POA: Insufficient documentation

## 2020-02-06 DIAGNOSIS — G8929 Other chronic pain: Secondary | ICD-10-CM | POA: Diagnosis present

## 2020-02-06 DIAGNOSIS — M6281 Muscle weakness (generalized): Secondary | ICD-10-CM

## 2020-02-06 NOTE — Patient Instructions (Signed)
Access Code: Ralph Ware  URL: [email or web address]  Date: 02/06/2020  Prepared by: Loralyn Freshwater   Exercises Shoulder W - External Rotation with Resistance - 10 reps - 3 sets - 1x daily - 7x weekly

## 2020-02-06 NOTE — Therapy (Signed)
[facility] [address] [place], [address] Phone: [phone]   Fax:  [phone]  Physical Therapy Evaluation  Patient Details  Name: Ralph Ware MRN: [id number] Date of Birth: January 10, 1956 Referring Provider (PT): Joycelyn Schmid, MD   Encounter Date: 02/06/2020  PT End of Session - 02/06/20 0932    Visit Number  1    Number of Visits  13    Date for PT Re-Evaluation  03/21/20    Authorization Type  1    Authorization Time Period  of 10 progress report    PT Start Time  0933    PT Stop Time  1043    PT Time Calculation (min)  70 min    Activity Tolerance  Patient tolerated treatment well    Behavior During Therapy  [facility] for tasks assessed/performed       Past Medical History:  Diagnosis Date  . Allergic rhinitis   . Hyperlipidemia   . Hypertension   . OSA (obstructive sleep apnea)     Past Surgical History:  Procedure Laterality Date  . NASAL SEPTUM SURGERY     x2    There were no vitals filed for this visit.   Subjective Assessment - 02/06/20 0937    Subjective  Neck pain: R upper trap and proximal lateral arm pain. 1/10 currently, 3/10 at most for the past 3 months    Pertinent History  Neck pain: R upper trap and proximal lateral arm pain. Pain started suddenly after Fathers day 2019. Pt reached towards his alarm clock and pain shot to his arm. Pain has improved but has not gone away. Pt was doing yardwork August 2020 and felt pain again B shoulders all the way down to his index and middle fingers bilaterally while performing yardwork. In 2019, pain was in the right side and could not botton his shirt. Had x-rays for neck and shoulders which showed a little bit or arthritis. Pt is R hand dominant. Has not yet had PT for current condition.    Patient Stated Goals  Make the pain go away.    Currently in Pain?  Yes    Pain Score  1     Pain Location  Shoulder    Pain Orientation  Right     Pain Type  Chronic pain    Pain Onset  More than a month ago    Pain Frequency  Occasional    Aggravating Factors   R side lying in bed (uses a medium pillow, firm mattress); reaching to the R and up (R shoulder abduction), picking up heavy objects    Pain Relieving Factors  L side lying, moving his R shoulder. Steroids         [facility] PT Assessment - 02/06/20 0937      Assessment   Medical Diagnosis  neck pain, cervical radiculopathy    Referring Provider (PT)  Joycelyn Schmid, MD    Onset Date/Surgical Date  01/11/20   date PT referral signed   Hand Dominance  Left    Prior Therapy  none for current condition      Precautions   Precaution Comments  no known precautions      Restrictions   Other Position/Activity Restrictions  no known restrictions0      Balance Screen   Has the patient fallen in the past 6 months  No    Has the patient had a decrease in activity level because of  a fear of falling?   No    Is the patient reluctant to leave their home because of a fear of falling?   No      Observation/Other Assessments   Focus on Therapeutic Outcomes (FOTO)   Neck FOTO: 66      Posture/Postural Control   Posture Comments  protracted neck, R latearl shift neck posture, B protracted shoulders, R lateral shift,       AROM   Overall AROM Comments  L shoulder flexion full; R shoulder flexion and abduction reveals painful arc    Right Shoulder Flexion  155 Degrees   with reproduction or symptoms; 172 AAROM, stiff end feel   Right Shoulder ABduction  155 Degrees   183 AAROM; painful arc    Cervical Flexion  full    Cervical Extension  WFL with lower cervical extension pressure    Cervical - Right Side Bend  WFL with L lateral cervical discomfort    Cervical - Left Side Bend  WFL with slight R lateral neck pull    Cervical - Right Rotation  WFL with R C4 dermatome discomfort/upper trap area    Cervical - Left Rotation  WFL with L lateral cervical tightness      Strength    Right Shoulder Flexion  4+/5   IR motion compensation   Right Shoulder ABduction  4/5   with reproduction of symptoms the most   Right Shoulder Internal Rotation  4/5   with shoulder symptom reprodction    Right Shoulder External Rotation  4/5      Palpation   Palpation comment  decreased posterior, inferior, lateral glide R glenohumeral joint      Special Tests   Other special tests  (+) empty can test, (+) Neers impingement test, Leanord Asal test.                 Objective measurements completed on examination: See above findings.   Blood pressure controlled per pt.  No latex band allergies  [employer] Access Code: [email or web address]  Manual therapy  Supine posterior, inferior, inferior lateral glide R shoulder joint grade 3 to promote mobility   No shoulder discomfort at rest Still has pain with flexion and abduction     Therapeutic exercise  Standing B shoulder ER with yellow band 10x2, pain free range of motion   Improved exercise technique, movement at target joints, use of target muscles after mod verbal, visual, tactile cues.    Response to treatment Decreased pain with R shoulder flexion after infraspinatus muscle strengthening.    Clinical impression Patient is a 64 year old male who came to physical therapy secondary to R shoulder pain. He also presents with thoracic kyphosis, scapular and cervical protraction posture, scapular and shoulder weakness, poor glenohumeral mechanics, positive special tests suggesting impingement and rotator cuff involvement, and difficulty laying on his R side, as well as reaching with his R arm due to pain. Patient will benefit from skilled physical therapy services to address the aforementioned deficits.          PT Education - 02/06/20 1239    Education Details  ther-ex, HEP, plan of care    Person(s) Educated  Patient    Methods  Explanation;Demonstration;Tactile cues;Verbal cues;Handout    Comprehension  Returned  demonstration;Verbalized understanding       PT Short Term Goals - 02/06/20 1108      PT SHORT TERM GOAL #1   Title  Patient will be independent with his  HEP to decrase pain, improve ability to reach and sleep on his R side more comfortably.    Baseline  Patient has started his HEP (02/06/2020)    Time  3    Period  Weeks    Status  New    Target Date  02/29/20        PT Long Term Goals - 02/06/20 1109      PT LONG TERM GOAL #1   Title  Patient will have a decrease in R shoulder pain to 1/10 or less at worst to promote ability to reach as well as sleep on his R side more comfortably.    Baseline  3/10 R shoulder pain at worst for the past 3 months (02/06/2020)    Time  6    Period  Weeks    Status  New    Target Date  03/21/20      PT LONG TERM GOAL #2   Title  Patient will improve R shoulder ER strength by 1/2 MMT grade to promote ability to raise his arm up with less pain.    Baseline  4/5 ER strength (02/16/2020)    Time  6    Period  Weeks    Status  New    Target Date  03/21/20      PT LONG TERM GOAL #3   Title  Pt will improve his neck FOTO score by at least 10 points as a demonstration of improved function.    Baseline  Neck FOTO: 66 (02/06/2020)    Time  6    Period  Weeks    Status  New    Target Date  03/21/20             Plan - 02/06/20 1103    Clinical Impression Statement  Patient is a 64 year old male who came to physical therapy secondary to R shoulder pain. He also presents with thoracic kyphosis, scapular and cervical protraction posture, scapular and shoulder weakness, poor glenohumeral mechanics, positive special tests suggesting impingement and rotator cuff involvement, and difficulty laying on his R side, as well as reaching with his R arm due to pain. Patient will benefit from skilled physical therapy services to address the aforementioned deficits.    Personal Factors and Comorbidities  Age;Fitness;Past/Current Experience;Time since onset of  injury/illness/exacerbation    Examination-Activity Limitations  Bed Mobility;Sleep;Lift;Reach Overhead    Stability/Clinical Decision Making  Stable/Uncomplicated    Clinical Decision Making  Low    Rehab Potential  Fair    PT Frequency  2x / week    PT Duration  6 weeks    PT Treatment/Interventions  Therapeutic activities;Therapeutic exercise;Neuromuscular re-education;Patient/family education;Manual techniques;Dry needling;Joint Manipulations;Spinal Manipulations;Aquatic Therapy;Electrical Stimulation;Iontophoresis 4mg /ml Dexamethasone;Traction   traction and manipulation if appropriate   PT Next Visit Plan  scapular and ER muscle strengthening, thoracic extension, manual techniques, modalities PRN    Consulted and Agree with Plan of Care  Patient       Patient will benefit from skilled therapeutic intervention in order to improve the following deficits and impairments:  Pain, Postural dysfunction, Improper body mechanics, Decreased strength  Visit Diagnosis: Chronic right shoulder pain - Plan: PT plan of care cert/re-cert  Muscle weakness (generalized) - Plan: PT plan of care cert/re-cert     Problem List Patient Active Problem List   Diagnosis Date Noted  . COVID-19   . Hypoxia   . Pneumonia due to COVID-19 virus 11/16/2019  . Hypokalemia 11/16/2019  . Transaminitis  11/16/2019  . Seasonal and perennial allergic rhinitis 07/14/2017  . Adverse food reaction 01/18/2017  . Allergic rhinitis due to pollen 02/24/2011  . Obstructive sleep apnea 08/30/2009  . RHINOSINUSITIS, ACUTE 01/03/2009  . HYPERLIPIDEMIA 06/28/2008  . Essential hypertension 06/28/2008  . Chronic nonseasonal allergic rhinitis due to fungal spores 10/12/2007      Joneen Boers PT, DPT   02/06/2020, 12:45 PM  [place] [facility] [address] [place], [address] Phone: [phone]   Fax:  [phone]  Name: Ralph Ware MRN:  [id number] Date of Birth: 11-23-1956

## 2020-02-08 ENCOUNTER — Ambulatory Visit: Payer: Managed Care, Other (non HMO)

## 2020-02-12 ENCOUNTER — Other Ambulatory Visit: Payer: Self-pay

## 2020-02-12 ENCOUNTER — Ambulatory Visit: Payer: Managed Care, Other (non HMO)

## 2020-02-12 DIAGNOSIS — G8929 Other chronic pain: Secondary | ICD-10-CM

## 2020-02-12 DIAGNOSIS — M25511 Pain in right shoulder: Secondary | ICD-10-CM | POA: Diagnosis not present

## 2020-02-12 DIAGNOSIS — M6281 Muscle weakness (generalized): Secondary | ICD-10-CM

## 2020-02-12 NOTE — Therapy (Signed)
[facility] [address], [place], [address] Phone: [phone]   Fax:  [phone]  Physical Therapy Treatment  Patient Details  Name: Ralph Ware MRN: [id number] Date of Birth: October 08, 1956 Referring Provider (PT): Joycelyn Schmid, MD   Encounter Date: 02/12/2020  PT End of Session - 02/12/20 1406    Visit Number  2    Number of Visits  13    Date for PT Re-Evaluation  03/21/20    Authorization Type  2    Authorization Time Period  of 10 progress report    PT Start Time  1406    PT Stop Time  1451    PT Time Calculation (min)  45 min    Activity Tolerance  Patient tolerated treatment well    Behavior During Therapy  [facility] for tasks assessed/performed       Past Medical History:  Diagnosis Date  . Allergic rhinitis   . Hyperlipidemia   . Hypertension   . OSA (obstructive sleep apnea)     Past Surgical History:  Procedure Laterality Date  . NASAL SEPTUM SURGERY     x2    There were no vitals filed for this visit.  Subjective Assessment - 02/12/20 1407    Subjective  R shoulder area is about the same. 0/10 at rest, 1/10 with shoulder flexion.    Pertinent History  Neck pain: R upper trap and proximal lateral arm pain. Pain started suddenly after Fathers day 2019. Pt reached towards his alarm clock and pain shot to his arm. Pain has improved but has not gone away. Pt was doing yardwork August 2020 and felt pain again B shoulders all the way down to his index and middle fingers bilaterally while performing yardwork. In 2019, pain was in the right side and could not botton his shirt. Had x-rays for neck and shoulders which showed a little bit or arthritis. Pt is R hand dominant. Has not yet had PT for current condition.    Patient Stated Goals  Make the pain go away.    Currently in Pain?  Yes    Pain Score  1    shoulder flexion   Pain Onset  More than a month ago                                PT Education - 02/12/20 1606    Education Details  ther-ex    Person(s) Educated  Patient    Methods  Explanation;Demonstration;Tactile cues;Verbal cues    Comprehension  Returned demonstration;Verbalized understanding      Objective    Blood pressure controlled per pt.  No latex band allergies  [employer] Access Code: [email or web address]  Manual therapy  Supine STM R distal pectoralis muscle  Supine STM R teres major muscle   Decrease R shoulder pain with flexion    S/L R scapular mobilization   Try inferior glide at [facility] joint next visit if approprate secondary to end range pain with shoulder flexion    Therapeutic exercise   Supine open books 4x5 seconds. R shoulder discomfort.   Supine manually resisted R shoulder extension 10x2  Supine manually resisted R shoulder extension/adduction from scaption position 10x2   Decreased pain with shoulder flexion    Seated R shoulder flexion with manually resisted ER and triceps extension 10x     Improved exercise technique, movement at target joints, use of  target muscles after mod verbal, visual, tactile cues.      Response to treatment  End range pain only with R shoulder flexion after session. Still demoinstrates R shoulder pain with abduction.  Clinical impression Pt demonstrates tightness at pectoralis and teres major muscles in which treatment to help decrease tension improved comfort level with shoulder flexion AROM. Pt also demonstrates difficulty with scapular control and needed cues to help address. Pt tolerated session well without aggravation of symptoms. Pt will benefit from continued skilled physical therapy services to decrease pain, improve ROM, strength, and function.    PT Short Term Goals - 02/06/20 1108      PT SHORT TERM GOAL #1   Title  Patient will be independent with his HEP to decrase pain, improve ability to reach and sleep on his R side  more comfortably.    Baseline  Patient has started his HEP (02/06/2020)    Time  3    Period  Weeks    Status  New    Target Date  02/29/20        PT Long Term Goals - 02/06/20 1109      PT LONG TERM GOAL #1   Title  Patient will have a decrease in R shoulder pain to 1/10 or less at worst to promote ability to reach as well as sleep on his R side more comfortably.    Baseline  3/10 R shoulder pain at worst for the past 3 months (02/06/2020)    Time  6    Period  Weeks    Status  New    Target Date  03/21/20      PT LONG TERM GOAL #2   Title  Patient will improve R shoulder ER strength by 1/2 MMT grade to promote ability to raise his arm up with less pain.    Baseline  4/5 ER strength (02/16/2020)    Time  6    Period  Weeks    Status  New    Target Date  03/21/20      PT LONG TERM GOAL #3   Title  Pt will improve his neck FOTO score by at least 10 points as a demonstration of improved function.    Baseline  Neck FOTO: 66 (02/06/2020)    Time  6    Period  Weeks    Status  New    Target Date  03/21/20            Plan - 02/12/20 1606    Clinical Impression Statement  Pt demonstrates tightness at pectoralis and teres major muscles in which treatment to help decrease tension improved comfort level with shoulder flexion AROM. Pt also demonstrates difficulty with scapular control and needed cues to help address. Pt tolerated session well without aggravation of symptoms. Pt will benefit from continued skilled physical therapy services to decrease pain, improve ROM, strength, and function.    Personal Factors and Comorbidities  Age;Fitness;Past/Current Experience;Time since onset of injury/illness/exacerbation    Examination-Activity Limitations  Bed Mobility;Sleep;Lift;Reach Overhead    Stability/Clinical Decision Making  Stable/Uncomplicated    Rehab Potential  Fair    PT Frequency  2x / week    PT Duration  6 weeks    PT Treatment/Interventions  Therapeutic  activities;Therapeutic exercise;Neuromuscular re-education;Patient/family education;Manual techniques;Dry needling;Joint Manipulations;Spinal Manipulations;Aquatic Therapy;Electrical Stimulation;Iontophoresis 4mg /ml Dexamethasone;Traction   traction and manipulation if appropriate   PT Next Visit Plan  scapular and ER muscle strengthening, thoracic extension, manual techniques, modalities PRN  Consulted and Agree with Plan of Care  Patient       Patient will benefit from skilled therapeutic intervention in order to improve the following deficits and impairments:  Pain, Postural dysfunction, Improper body mechanics, Decreased strength  Visit Diagnosis: Chronic right shoulder pain  Muscle weakness (generalized)     Problem List Patient Active Problem List   Diagnosis Date Noted  . COVID-19   . Hypoxia   . Pneumonia due to COVID-19 virus 11/16/2019  . Hypokalemia 11/16/2019  . Transaminitis 11/16/2019  . Seasonal and perennial allergic rhinitis 07/14/2017  . Adverse food reaction 01/18/2017  . Allergic rhinitis due to pollen 02/24/2011  . Obstructive sleep apnea 08/30/2009  . RHINOSINUSITIS, ACUTE 01/03/2009  . HYPERLIPIDEMIA 06/28/2008  . Essential hypertension 06/28/2008  . Chronic nonseasonal allergic rhinitis due to fungal spores 10/12/2007    Loralyn Freshwater PT, DPT   02/12/2020, 4:14 PM  [facility] [address], [place], [address] Phone: [phone]   Fax:  [phone]  Name: Ralph Ware MRN: [id number] Date of Birth: 1956-01-15

## 2020-02-13 ENCOUNTER — Ambulatory Visit: Payer: Managed Care, Other (non HMO)

## 2020-02-14 ENCOUNTER — Ambulatory Visit: Payer: Managed Care, Other (non HMO)

## 2020-02-14 ENCOUNTER — Other Ambulatory Visit: Payer: Self-pay

## 2020-02-14 DIAGNOSIS — M25511 Pain in right shoulder: Secondary | ICD-10-CM | POA: Diagnosis not present

## 2020-02-14 DIAGNOSIS — G8929 Other chronic pain: Secondary | ICD-10-CM

## 2020-02-14 DIAGNOSIS — M6281 Muscle weakness (generalized): Secondary | ICD-10-CM

## 2020-02-14 NOTE — Patient Instructions (Signed)
Access Code: Berenice Primas  URL: [email or web address]  Date: 02/14/2020  Prepared by: Loralyn Freshwater   Exercises Shoulder W - External Rotation with Resistance - 10 reps - 3 sets - 1x daily - 7x weekly Sleeper Stretch - 10 reps - 3 sets - 5 seconds hold - 1x daily - 7x weekly Standing Shoulder Posterior Capsule Stretch - 5 reps - 2 sets - 15 seconds hold - 1x daily - 7x weekly

## 2020-02-14 NOTE — Therapy (Signed)
[place] [facility] [address] [place], [address] Phone: [phone]   Fax:  [phone]  Physical Therapy Treatment  Patient Details  Name: Ralph Ware MRN: [id number] Date of Birth: 05-11-1956 Referring Provider (PT): Andrey Spearman, MD   Encounter Date: 02/14/2020  PT End of Session - 02/14/20 1349    Visit Number  3    Number of Visits  13    Date for PT Re-Evaluation  03/21/20    Authorization Type  3    Authorization Time Period  of 10 progress report    PT Start Time  1696    PT Stop Time  1433    PT Time Calculation (min)  44 min    Activity Tolerance  Patient tolerated treatment well    Behavior During Therapy  [facility] for tasks assessed/performed       Past Medical History:  Diagnosis Date  . Allergic rhinitis   . Hyperlipidemia   . Hypertension   . OSA (obstructive sleep apnea)     Past Surgical History:  Procedure Laterality Date  . NASAL SEPTUM SURGERY     x2    There were no vitals filed for this visit.  Subjective Assessment - 02/14/20 1350    Subjective  R shoulder is about the same. Still has pain when sleeping on his R side. 0/10 currently.    Pertinent History  Neck pain: R upper trap and proximal lateral arm pain. Pain started suddenly after Fathers day 2019. Pt reached towards his alarm clock and pain shot to his arm. Pain has improved but has not gone away. Pt was doing yardwork August 2020 and felt pain again B shoulders all the way down to his index and middle fingers bilaterally while performing yardwork. In 2019, pain was in the right side and could not botton his shirt. Had x-rays for neck and shoulders which showed a little bit or arthritis. Pt is R hand dominant. Has not yet had PT for current condition.    Patient Stated Goals  Make the pain go away.    Currently in Pain?  No/denies    Pain Score  0-No pain    Pain Onset  More than a month ago                                PT Education - 02/14/20 1400    Education Details  ther-ex, HEP    Person(s) Educated  Patient    Methods  Explanation;Demonstration;Tactile cues;Verbal cues;Handout    Comprehension  Returned demonstration;Verbalized understanding        Objective    Blood pressure controlled per pt.  No latex band allergies  MedbridigeAccess Code: [email or web address]  Manual therapy  Seated caudal glide R AC joint grade 3 (distal clavicle)   No R shoulder pain with abduction   Supine inferior glide R shoulder with R arm in about 120 degrees abduction grade 3  R lateral shoulder discomfort with flexion and abduction AROM  Supine gentle R shoulder distraction 5 seconds x 20  Supine STM R teres major muscle               Therapeutic exercise  R shoulder flexion AROM: catch sensation past about 120 + degrees  R shoulder abduction: catch sensation around 100-110 degrees   R S/L sleeper/posterior capsule stretch 10x5 seconds  Supine R cross arm/horizontal adduction stretch 15  seconds for 5x   Improved comfort level with shoulder AROM after aforementioned exercises.   Reviewed HEP. Pt demonstrated and verbalized understanding. Handout provided.   Seated self R shoulder inferior capsule stretch 5x5 seconds    R shoulder flexion and abduction multiple times throughout session to test effectiveness of treatment.     Improved exercise technique, movement at target joints, use of target muscles after min to mod verbal, visual, tactile cues.      Response to treatment Decreased R shoulder pain with abduction with caudal glide to distal clavicle  Clinical impression Improving overall comfort level and decreasing painful arc with R shoulder flexion observed. No pain with R shoulder abduction after distal clavicle mobilization at [facility] joint. Pt will benefit from continued skilled physical therapy services to decrease pain, improve  ROM, strength, and function.     PT Short Term Goals - 02/06/20 1108      PT SHORT TERM GOAL #1   Title  Patient will be independent with his HEP to decrase pain, improve ability to reach and sleep on his R side more comfortably.    Baseline  Patient has started his HEP (02/06/2020)    Time  3    Period  Weeks    Status  New    Target Date  02/29/20        PT Long Term Goals - 02/06/20 1109      PT LONG TERM GOAL #1   Title  Patient will have a decrease in R shoulder pain to 1/10 or less at worst to promote ability to reach as well as sleep on his R side more comfortably.    Baseline  3/10 R shoulder pain at worst for the past 3 months (02/06/2020)    Time  6    Period  Weeks    Status  New    Target Date  03/21/20      PT LONG TERM GOAL #2   Title  Patient will improve R shoulder ER strength by 1/2 MMT grade to promote ability to raise his arm up with less pain.    Baseline  4/5 ER strength (02/16/2020)    Time  6    Period  Weeks    Status  New    Target Date  03/21/20      PT LONG TERM GOAL #3   Title  Pt will improve his neck FOTO score by at least 10 points as a demonstration of improved function.    Baseline  Neck FOTO: 66 (02/06/2020)    Time  6    Period  Weeks    Status  New    Target Date  03/21/20            Plan - 02/14/20 1400    Clinical Impression Statement  Improving overall comfort level and decreasing painful arc with R shoulder flexion observed. No pain with R shoulder abduction after distal clavicle mobilization at [facility] joint. Pt will benefit from continued skilled physical therapy services to decrease pain, improve ROM, strength, and function.    Personal Factors and Comorbidities  Age;Fitness;Past/Current Experience;Time since onset of injury/illness/exacerbation    Examination-Activity Limitations  Bed Mobility;Sleep;Lift;Reach Overhead    Stability/Clinical Decision Making  Stable/Uncomplicated    Rehab Potential  Fair    PT Frequency  2x /  week    PT Duration  6 weeks    PT Treatment/Interventions  Therapeutic activities;Therapeutic exercise;Neuromuscular re-education;Patient/family education;Manual techniques;Dry needling;Joint Manipulations;Spinal Manipulations;Aquatic Therapy;Electrical Stimulation;Iontophoresis  4mg /ml Dexamethasone;Traction   traction and manipulation if appropriate   PT Next Visit Plan  scapular and ER muscle strengthening, thoracic extension, manual techniques, modalities PRN    Consulted and Agree with Plan of Care  Patient       Patient will benefit from skilled therapeutic intervention in order to improve the following deficits and impairments:  Pain, Postural dysfunction, Improper body mechanics, Decreased strength  Visit Diagnosis: Chronic right shoulder pain  Muscle weakness (generalized)     Problem List Patient Active Problem List   Diagnosis Date Noted  . COVID-19   . Hypoxia   . Pneumonia due to COVID-19 virus 11/16/2019  . Hypokalemia 11/16/2019  . Transaminitis 11/16/2019  . Seasonal and perennial allergic rhinitis 07/14/2017  . Adverse food reaction 01/18/2017  . Allergic rhinitis due to pollen 02/24/2011  . Obstructive sleep apnea 08/30/2009  . RHINOSINUSITIS, ACUTE 01/03/2009  . HYPERLIPIDEMIA 06/28/2008  . Essential hypertension 06/28/2008  . Chronic nonseasonal allergic rhinitis due to fungal spores 10/12/2007    10/14/2007 PT, DPT   02/14/2020, 5:03 PM  [place] [facility] [address], [address], [place] Phone: [phone]   Fax:  [phone]  Name: Kermit Arnette MRN: Thamas Jaegers Date of Birth: [birth year]

## 2020-02-17 ENCOUNTER — Ambulatory Visit: Payer: Managed Care, Other (non HMO) | Attending: Internal Medicine

## 2020-02-17 DIAGNOSIS — Z23 Encounter for immunization: Secondary | ICD-10-CM

## 2020-02-17 NOTE — Progress Notes (Signed)
   Covid-19 Vaccination Clinic  Name:  Ralph Ware    MRN: [id number] DOB: [birth year]  02/17/2020  Mr. Ralph Ware was observed post Covid-19 immunization for 15 minutes without incidence. He was provided with Vaccine Information Sheet and instruction to access the V-Safe system.   Mr. Ralph Ware was instructed to call 911 with any severe reactions post vaccine: Marland Kitchen Difficulty breathing  . Swelling of your face and throat  . A fast heartbeat  . A bad rash all over your body  . Dizziness and weakness    Immunizations Administered    Name Date Dose VIS Date Route   Moderna COVID-19 Vaccine 02/17/2020  1:12 PM 0.5 mL 11/21/2019 Intramuscular   Manufacturer: Moderna   Lot: [id number]   NDC: 16244-695-07

## 2020-02-20 ENCOUNTER — Ambulatory Visit: Payer: Managed Care, Other (non HMO) | Attending: Diagnostic Neuroimaging

## 2020-02-20 ENCOUNTER — Other Ambulatory Visit: Payer: Self-pay

## 2020-02-20 DIAGNOSIS — M6281 Muscle weakness (generalized): Secondary | ICD-10-CM | POA: Insufficient documentation

## 2020-02-20 DIAGNOSIS — M25511 Pain in right shoulder: Secondary | ICD-10-CM | POA: Diagnosis present

## 2020-02-20 DIAGNOSIS — G8929 Other chronic pain: Secondary | ICD-10-CM | POA: Diagnosis present

## 2020-02-20 NOTE — Therapy (Signed)
[place] [facility] [address], [place], [address] Phone: [phone]   Fax:  [phone]  Physical Therapy Treatment  Patient Details  Name: Ralph Ware MRN: [id number] Date of Birth: 1956/10/31 Referring Provider (PT): Andrey Spearman, MD   Encounter Date: 02/20/2020  PT End of Session - 02/20/20 1401    Visit Number  4    Number of Visits  13    Date for PT Re-Evaluation  03/21/20    Authorization Type  3    Authorization Time Period  of 10 progress report    PT Start Time  1401    PT Stop Time  1445    PT Time Calculation (min)  44 min    Activity Tolerance  Patient tolerated treatment well    Behavior During Therapy  [facility] for tasks assessed/performed       Past Medical History:  Diagnosis Date  . Allergic rhinitis   . Hyperlipidemia   . Hypertension   . OSA (obstructive sleep apnea)     Past Surgical History:  Procedure Laterality Date  . NASAL SEPTUM SURGERY     x2    There were no vitals filed for this visit.  Subjective Assessment - 02/20/20 1402    Subjective  R shoulder is ok. Not as painful as it was (with abduction and flexion). Still hurts to sleep on it but the motion is better. Was able to do yardwork this weekend which did not make his arm any worse.    Pertinent History  Neck pain: R upper trap and proximal lateral arm pain. Pain started suddenly after Fathers day 2019. Pt reached towards his alarm clock and pain shot to his arm. Pain has improved but has not gone away. Pt was doing yardwork August 2020 and felt pain again B shoulders all the way down to his index and middle fingers bilaterally while performing yardwork. In 2019, pain was in the right side and could not botton his shirt. Had x-rays for neck and shoulders which showed a little bit or arthritis. Pt is R hand dominant. Has not yet had PT for current condition.    Patient Stated Goals  Make the pain go away.    Currently in  Pain?  Yes    Pain Score  1    end range abduction and flexion   Pain Onset  More than a month ago                               PT Education - 02/20/20 1425    Education Details  ther-ex    [employer]) Educated  Patient    Methods  Explanation;Demonstration;Tactile cues;Verbal cues    Comprehension  Returned demonstration;Verbalized understanding      Objective    Blood pressure controlled per pt.  No latex band allergies  MedbridigeAccess Code: [email or web address]  Manual therapy  Seated caudal glide R AC joint grade 3- (distal clavicle)              No R shoulder pain with abduction, decreased discomfort with end range flexion    Supine STM R teres major muscle       Therapeutic exercise  Supine B shoulder flexion with yellow band resisting ER 5x3  Give as part of HEP next visit if appropriate  Supine horziontal abduction yellow band 10x3  Supine PNF D2 flexion yellow band R UE 10x3  Give as part of HEP next visit if appropriate  R shoulder abduction with PT assist for proper scapular movement 10x  Seated thoracic extension over chair 10x5 seconds to promote scapular retraction    Improved exercise technique, movement at target joints, use of target muscles after min to mod verbal, visual, tactile cues.     Response to treatment Pt tolerated session well without aggravation of symptoms.   Clinical impression Decreased R shoulder pain with treatment to improve distal clavicle mobility as well as decreasing teres major and posterior deltoid muscle tension. Continued working on ER and scapular muscle strengthening and promote scapular control to improve glenohumeral mechanics and decrease impingement. Improving overall ability to raise his R arm more comfortably. Pt tolerated session well without aggravation of symptoms. Pt will benefit from continued skilled physical therapy services to decrease pain, improve ROM, strength,  and function.     PT Short Term Goals - 02/06/20 1108      PT SHORT TERM GOAL #1   Title  Patient will be independent with his HEP to decrase pain, improve ability to reach and sleep on his R side more comfortably.    Baseline  Patient has started his HEP (02/06/2020)    Time  3    Period  Weeks    Status  New    Target Date  02/29/20        PT Long Term Goals - 02/06/20 1109      PT LONG TERM GOAL #1   Title  Patient will have a decrease in R shoulder pain to 1/10 or less at worst to promote ability to reach as well as sleep on his R side more comfortably.    Baseline  3/10 R shoulder pain at worst for the past 3 months (02/06/2020)    Time  6    Period  Weeks    Status  New    Target Date  03/21/20      PT LONG TERM GOAL #2   Title  Patient will improve R shoulder ER strength by 1/2 MMT grade to promote ability to raise his arm up with less pain.    Baseline  4/5 ER strength (02/16/2020)    Time  6    Period  Weeks    Status  New    Target Date  03/21/20      PT LONG TERM GOAL #3   Title  Pt will improve his neck FOTO score by at least 10 points as a demonstration of improved function.    Baseline  Neck FOTO: 66 (02/06/2020)    Time  6    Period  Weeks    Status  New    Target Date  03/21/20            Plan - 02/20/20 1426    Clinical Impression Statement  Decreased R shoulder pain with treatment to improve distal clavicle mobility as well as decreasing teres major and posterior deltoid muscle tension. Continued working on ER and scapular muscle strengthening and promote scapular control to improve glenohumeral mechanics and decrease impingement. Improving overall ability to raise his R arm more comfortably. Pt tolerated session well without aggravation of symptoms. Pt will benefit from continued skilled physical therapy services to decrease pain, improve ROM, strength, and function.    Personal Factors and Comorbidities  Age;Fitness;Past/Current Experience;Time since  onset of injury/illness/exacerbation    Examination-Activity Limitations  Bed Mobility;Sleep;Lift;Reach Overhead    Stability/Clinical Decision Making  Stable/Uncomplicated  Rehab Potential  Fair    PT Frequency  2x / week    PT Duration  6 weeks    PT Treatment/Interventions  Therapeutic activities;Therapeutic exercise;Neuromuscular re-education;Patient/family education;Manual techniques;Dry needling;Joint Manipulations;Spinal Manipulations;Aquatic Therapy;Electrical Stimulation;Iontophoresis 4mg /ml Dexamethasone;Traction   traction and manipulation if appropriate   PT Next Visit Plan  scapular and ER muscle strengthening, thoracic extension, manual techniques, modalities PRN    Consulted and Agree with Plan of Care  Patient       Patient will benefit from skilled therapeutic intervention in order to improve the following deficits and impairments:  Pain, Postural dysfunction, Improper body mechanics, Decreased strength  Visit Diagnosis: Chronic right shoulder pain  Muscle weakness (generalized)     Problem List Patient Active Problem List   Diagnosis Date Noted  . COVID-19   . Hypoxia   . Pneumonia due to COVID-19 virus 11/16/2019  . Hypokalemia 11/16/2019  . Transaminitis 11/16/2019  . Seasonal and perennial allergic rhinitis 07/14/2017  . Adverse food reaction 01/18/2017  . Allergic rhinitis due to pollen 02/24/2011  . Obstructive sleep apnea 08/30/2009  . RHINOSINUSITIS, ACUTE 01/03/2009  . HYPERLIPIDEMIA 06/28/2008  . Essential hypertension 06/28/2008  . Chronic nonseasonal allergic rhinitis due to fungal spores 10/12/2007     10/14/2007 PT, DPT   02/20/2020, 2:59 PM  [place] [facility] [address], [address], [place] Phone: [phone]   Fax:  [phone]  Name: Ralph Ware MRN: Thamas Jaegers Date of Birth: [birth year]

## 2020-02-22 ENCOUNTER — Ambulatory Visit: Payer: Managed Care, Other (non HMO)

## 2020-02-22 ENCOUNTER — Other Ambulatory Visit: Payer: Self-pay

## 2020-02-22 DIAGNOSIS — M25511 Pain in right shoulder: Secondary | ICD-10-CM | POA: Diagnosis not present

## 2020-02-22 DIAGNOSIS — G8929 Other chronic pain: Secondary | ICD-10-CM

## 2020-02-22 DIAGNOSIS — M6281 Muscle weakness (generalized): Secondary | ICD-10-CM

## 2020-02-22 NOTE — Patient Instructions (Signed)
Access Code: Berenice Primas  URL: [email or web address]  Date: 02/22/2020  Prepared by: Loralyn Freshwater   Exercises Shoulder W - External Rotation with Resistance - 10 reps - 3 sets - 1x daily - 7x weekly Sleeper Stretch - 10 reps - 3 sets - 5 seconds hold - 1x daily - 7x weekly Standing Shoulder Posterior Capsule Stretch - 5 reps - 2 sets - 15 seconds hold - 1x daily - 7x weekly Single Arm Doorway Pec Stretch at 90 Degrees Abduction - 3 reps - 1 sets - 30 seconds hold - 3x daily - 7x weekly Supine PNF D2 Flexion with Resistance - 10 reps - 3 sets - 1x daily - 7x weekly   supine B shoulder flexion with yellow band resisting ER 10x3  Given as part of HEP 10x3 daily.   Pt demonstrated and verbalized understanding.

## 2020-02-22 NOTE — Therapy (Signed)
[facility] [address] [place], [address] Phone: [phone]   Fax:  [phone]  Physical Therapy Treatment  Patient Details  Name: Ralph Ware MRN: [id number] Date of Birth: 11/19/1956 Referring Provider (PT): Joycelyn Schmid, MD   Encounter Date: 02/22/2020  PT End of Session - 02/22/20 0804    Visit Number  5    Number of Visits  13    Date for PT Re-Evaluation  03/21/20    Authorization Type  5    Authorization Time Period  of 10 progress report    PT Start Time  0804    PT Stop Time  0851    PT Time Calculation (min)  47 min    Activity Tolerance  Patient tolerated treatment well    Behavior During Therapy  [facility] for tasks assessed/performed       Past Medical History:  Diagnosis Date  . Allergic rhinitis   . Hyperlipidemia   . Hypertension   . OSA (obstructive sleep apnea)     Past Surgical History:  Procedure Laterality Date  . NASAL SEPTUM SURGERY     x2    There were no vitals filed for this visit.  Subjective Assessment - 02/22/20 0805    Subjective  R shoulder felt a little twinge of pain at one point but generally pretty good. Pain only at end range. Has also not been sleeping on his R side for the past 2 nights. 1/10 pain at end range for both abduction and flexion.    Pertinent History  Neck pain: R upper trap and proximal lateral arm pain. Pain started suddenly after Fathers day 2019. Pt reached towards his alarm clock and pain shot to his arm. Pain has improved but has not gone away. Pt was doing yardwork August 2020 and felt pain again B shoulders all the way down to his index and middle fingers bilaterally while performing yardwork. In 2019, pain was in the right side and could not botton his shirt. Had x-rays for neck and shoulders which showed a little bit or arthritis. Pt is R hand dominant. Has not yet had PT for current condition.    Patient Stated Goals  Make the pain go  away.    Currently in Pain?  Yes    Pain Score  1    at end range   Pain Onset  More than a month ago                               PT Education - 02/22/20 0826    Education Details  ther-ex, HEP    Person(s) Educated  Patient    Methods  Explanation;Demonstration;Tactile cues;Verbal cues;Handout    Comprehension  Returned demonstration;Verbalized understanding      Objective    Blood pressure controlled per pt.  No latex band allergies  MedbridigeAccess Code: [email or web address]  Manual therapy  Seated STM R UT and rhomboid muscles   Decreased end range discomfort with flexion and abduction     Therapeutic exercise   Seated manually resisted scapular retraction targeting lower trap muscles  R 10x3 with 5 second holds   R shoulder AROM with PT assist for scapular movement  Abduction 10x. No pain    R pectoralis muscle tension palpated  R pectoralis stretch at door 30 seconds x 3  Supine B shoulder flexion with yellow band resisting ER 10x3  Given as part of HEP 10x3 daily. Pt demonstrated and verbalized understanding.    Supine PNF D2 flexion yellow band R UE 10x3             Given as part of HEP. Pt demonstrated and verbalized understanding.     Improved exercise technique, movement at target joints, use of target muscles aftermin tomod verbal, visual, tactile cues.     Response to treatment Pt tolerated session well without aggravation of symptoms.   Clinical impression No shoulder pain with shoulder flexion and abduction but good lower trap muscle use felt at end of session. Improving utilization of proper scapular muscles and infraspinatus and teres minor muscle use and decreased pectoralis tension observed. Improving proper glenohumeral movement and control. Pt making very good progress with PT towards goals. Pt will benefit from continued skilled physical therapy services to decrease pain, improve ROM, strength and  function.    PT Short Term Goals - 02/06/20 1108      PT SHORT TERM GOAL #1   Title  Patient will be independent with his HEP to decrase pain, improve ability to reach and sleep on his R side more comfortably.    Baseline  Patient has started his HEP (02/06/2020)    Time  3    Period  Weeks    Status  New    Target Date  02/29/20        PT Long Term Goals - 02/06/20 1109      PT LONG TERM GOAL #1   Title  Patient will have a decrease in R shoulder pain to 1/10 or less at worst to promote ability to reach as well as sleep on his R side more comfortably.    Baseline  3/10 R shoulder pain at worst for the past 3 months (02/06/2020)    Time  6    Period  Weeks    Status  New    Target Date  03/21/20      PT LONG TERM GOAL #2   Title  Patient will improve R shoulder ER strength by 1/2 MMT grade to promote ability to raise his arm up with less pain.    Baseline  4/5 ER strength (02/16/2020)    Time  6    Period  Weeks    Status  New    Target Date  03/21/20      PT LONG TERM GOAL #3   Title  Pt will improve his neck FOTO score by at least 10 points as a demonstration of improved function.    Baseline  Neck FOTO: 66 (02/06/2020)    Time  6    Period  Weeks    Status  New    Target Date  03/21/20            Plan - 02/22/20 0902    Clinical Impression Statement  No shoulder pain with shoulder flexion and abduction but good lower trap muscle use felt at end of session. Improving utilization of proper scapular muscles and infraspinatus and teres minor muscle use and decreased pectoralis tension observed. Improving proper glenohumeral movement and control. Pt making very good progress with PT towards goals. Pt will benefit from continued skilled physical therapy services to decrease pain, improve ROM, strength and function.    Personal Factors and Comorbidities  Age;Fitness;Past/Current Experience;Time since onset of injury/illness/exacerbation    Examination-Activity Limitations   Bed Mobility;Sleep;Lift;Reach Overhead    Stability/Clinical Decision Making  Stable/Uncomplicated  Rehab Potential  Fair    PT Frequency  2x / week    PT Duration  6 weeks    PT Treatment/Interventions  Therapeutic activities;Therapeutic exercise;Neuromuscular re-education;Patient/family education;Manual techniques;Dry needling;Joint Manipulations;Spinal Manipulations;Aquatic Therapy;Electrical Stimulation;Iontophoresis 4mg /ml Dexamethasone;Traction   traction and manipulation if appropriate   PT Next Visit Plan  scapular and ER muscle strengthening, thoracic extension, manual techniques, modalities PRN    Consulted and Agree with Plan of Care  Patient       Patient will benefit from skilled therapeutic intervention in order to improve the following deficits and impairments:  Pain, Postural dysfunction, Improper body mechanics, Decreased strength  Visit Diagnosis: Chronic right shoulder pain  Muscle weakness (generalized)     Problem List Patient Active Problem List   Diagnosis Date Noted  . COVID-19   . Hypoxia   . Pneumonia due to COVID-19 virus 11/16/2019  . Hypokalemia 11/16/2019  . Transaminitis 11/16/2019  . Seasonal and perennial allergic rhinitis 07/14/2017  . Adverse food reaction 01/18/2017  . Allergic rhinitis due to pollen 02/24/2011  . Obstructive sleep apnea 08/30/2009  . RHINOSINUSITIS, ACUTE 01/03/2009  . HYPERLIPIDEMIA 06/28/2008  . Essential hypertension 06/28/2008  . Chronic nonseasonal allergic rhinitis due to fungal spores 10/12/2007    10/14/2007 PT, DPT   02/22/2020, 9:05 AM  [facility] PHYSICAL AND SPORTS MEDICINE [address], [place] Phone: [phone]   Fax:  [phone]  Name: Jahsir Rama MRN: Thamas Jaegers Date of Birth: 1956-11-08

## 2020-02-27 ENCOUNTER — Ambulatory Visit: Payer: Managed Care, Other (non HMO)

## 2020-02-27 ENCOUNTER — Other Ambulatory Visit: Payer: Self-pay

## 2020-02-27 DIAGNOSIS — G8929 Other chronic pain: Secondary | ICD-10-CM

## 2020-02-27 DIAGNOSIS — M25511 Pain in right shoulder: Secondary | ICD-10-CM

## 2020-02-27 DIAGNOSIS — M6281 Muscle weakness (generalized): Secondary | ICD-10-CM

## 2020-02-27 NOTE — Therapy (Signed)
[place] [facility] [address], [place], [address] Phone: [phone]   Fax:  [phone]  Physical Therapy Treatment  Patient Details  Name: Ralph Ware MRN: [id number] Date of Birth: 24-Oct-1956 Referring Provider (PT): Andrey Spearman, MD   Encounter Date: 02/27/2020  PT End of Session - 02/27/20 1301    Visit Number  6    Number of Visits  13    Date for PT Re-Evaluation  03/21/20    Authorization Type  6    Authorization Time Period  of 10 progress report    PT Start Time  1302    PT Stop Time  1347    PT Time Calculation (min)  45 min    Activity Tolerance  Patient tolerated treatment well    Behavior During Therapy  [facility] for tasks assessed/performed       Past Medical History:  Diagnosis Date  . Allergic rhinitis   . Hyperlipidemia   . Hypertension   . OSA (obstructive sleep apnea)     Past Surgical History:  Procedure Laterality Date  . NASAL SEPTUM SURGERY     x2    There were no vitals filed for this visit.  Subjective Assessment - 02/27/20 1303    Subjective  Woke up this morning to a sore R shoulder. No pain at rest. 1/10 at end range shoulder flexion    Pertinent History  Neck pain: R upper trap and proximal lateral arm pain. Pain started suddenly after Fathers day 2019. Pt reached towards his alarm clock and pain shot to his arm. Pain has improved but has not gone away. Pt was doing yardwork August 2020 and felt pain again B shoulders all the way down to his index and middle fingers bilaterally while performing yardwork. In 2019, pain was in the right side and could not botton his shirt. Had x-rays for neck and shoulders which showed a little bit or arthritis. Pt is R hand dominant. Has not yet had PT for current condition.    Patient Stated Goals  Make the pain go away.    Currently in Pain?  Yes    Pain Score  1     Pain Onset  More than a month ago                                PT Education - 02/27/20 1333    Education Details  ther-ex    Person(s) Educated  Patient    Methods  Explanation;Demonstration;Tactile cues;Verbal cues    Comprehension  Returned demonstration;Verbalized understanding      Objective    Blood pressure controlled per pt.  No latex band allergies  MedbridigeAccess Code: [email or web address]  Manual therapy  Supine caudal glide R distal clavicle grade 3-  Supine STM R distal pectoralis muscle supine STM R teres major muscles   Discomfort with shoulder flexion and abduction  Seated STM R UT and rhomboid muscles              Decreased end range discomfort with flexion and abduction        Therapeutic exercise   Seated manually resisted scapular retraction targeting lower trap muscles             R 10x3 with 5 second holds   Seated manually resisted R elbow flexion with PT 10x5 seconds for 2 sets  Then with addition of ER  10x5 seconds for 2 sets   Posterior translation of R humeral head palpated   R shoulder AROM with PT assist for scapular movement             Abduction 10x. No pain   Flexion 10x. No pain    Improved exercise technique, movement at target joints, use of target muscles aftermin tomod verbal, visual, tactile cues.     Response to treatment Pt tolerated session well without aggravation of symptoms.  Clinical impression  Decreased R shoulder pain with raising his arm into flexion and abduction with treatment to decrease R upper trap muscle and rhomboid muscle tension as well as improving rotator cuff and posterior shoulder muscle activation and promoting proper glenohumeral movement. Pt tolerated session well without aggravation of symptoms. Pt will benefit from continued skilled physical therapy services to decrease pain, improve strength and function.       PT Short Term Goals - 02/06/20 1108      PT SHORT TERM GOAL #1    Title  Patient will be independent with his HEP to decrase pain, improve ability to reach and sleep on his R side more comfortably.    Baseline  Patient has started his HEP (02/06/2020)    Time  3    Period  Weeks    Status  New    Target Date  02/29/20        PT Long Term Goals - 02/06/20 1109      PT LONG TERM GOAL #1   Title  Patient will have a decrease in R shoulder pain to 1/10 or less at worst to promote ability to reach as well as sleep on his R side more comfortably.    Baseline  3/10 R shoulder pain at worst for the past 3 months (02/06/2020)    Time  6    Period  Weeks    Status  New    Target Date  03/21/20      PT LONG TERM GOAL #2   Title  Patient will improve R shoulder ER strength by 1/2 MMT grade to promote ability to raise his arm up with less pain.    Baseline  4/5 ER strength (02/16/2020)    Time  6    Period  Weeks    Status  New    Target Date  03/21/20      PT LONG TERM GOAL #3   Title  Pt will improve his neck FOTO score by at least 10 points as a demonstration of improved function.    Baseline  Neck FOTO: 66 (02/06/2020)    Time  6    Period  Weeks    Status  New    Target Date  03/21/20            Plan - 02/27/20 1301    Clinical Impression Statement  Decreased R shoulder pain with raising his arm into flexion and abduction with treatment to decrease R upper trap muscle and rhomboid muscle tension as well as improving rotator cuff and posterior shoulder muscle activation and promoting proper glenohumeral movement. Pt tolerated session well without aggravation of symptoms. Pt will benefit from continued skilled physical therapy services to decrease pain, improve strength and function.    Personal Factors and Comorbidities  Age;Fitness;Past/Current Experience;Time since onset of injury/illness/exacerbation    Examination-Activity Limitations  Bed Mobility;Sleep;Lift;Reach Overhead    Stability/Clinical Decision Making  Stable/Uncomplicated    Rehab  Potential  Fair  PT Frequency  2x / week    PT Duration  6 weeks    PT Treatment/Interventions  Therapeutic activities;Therapeutic exercise;Neuromuscular re-education;Patient/family education;Manual techniques;Dry needling;Joint Manipulations;Spinal Manipulations;Aquatic Therapy;Electrical Stimulation;Iontophoresis 4mg /ml Dexamethasone;Traction   traction and manipulation if appropriate   PT Next Visit Plan  scapular and ER muscle strengthening, thoracic extension, manual techniques, modalities PRN    Consulted and Agree with Plan of Care  Patient       Patient will benefit from skilled therapeutic intervention in order to improve the following deficits and impairments:  Pain, Postural dysfunction, Improper body mechanics, Decreased strength  Visit Diagnosis: Chronic right shoulder pain  Muscle weakness (generalized)     Problem List Patient Active Problem List   Diagnosis Date Noted  . COVID-19   . Hypoxia   . Pneumonia due to COVID-19 virus 11/16/2019  . Hypokalemia 11/16/2019  . Transaminitis 11/16/2019  . Seasonal and perennial allergic rhinitis 07/14/2017  . Adverse food reaction 01/18/2017  . Allergic rhinitis due to pollen 02/24/2011  . Obstructive sleep apnea 08/30/2009  . RHINOSINUSITIS, ACUTE 01/03/2009  . HYPERLIPIDEMIA 06/28/2008  . Essential hypertension 06/28/2008  . Chronic nonseasonal allergic rhinitis due to fungal spores 10/12/2007   10/14/2007 PT, DPT   02/27/2020, 1:54 PM  [facility] PHYSICAL AND SPORTS MEDICINE [address], [address], [place] Phone: [phone]   Fax:  [phone]  Name: Ralph Ware MRN: Thamas Jaegers Date of Birth: [birth year]

## 2020-02-29 ENCOUNTER — Ambulatory Visit: Payer: Managed Care, Other (non HMO)

## 2020-02-29 ENCOUNTER — Other Ambulatory Visit: Payer: Self-pay

## 2020-02-29 DIAGNOSIS — M6281 Muscle weakness (generalized): Secondary | ICD-10-CM

## 2020-02-29 DIAGNOSIS — G8929 Other chronic pain: Secondary | ICD-10-CM

## 2020-02-29 DIAGNOSIS — M25511 Pain in right shoulder: Secondary | ICD-10-CM | POA: Diagnosis not present

## 2020-02-29 NOTE — Therapy (Signed)
[facility] MED[facility] [address], [place], [address] Phone: [phone]   Fax:  [phone]  Physical Therapy Treatment  Patient Details  Name: Ralph Ware MRN: [id number] Date of Birth: 1956-11-28 Referring Provider (PT): Joycelyn Schmid, MD   Encounter Date: 02/29/2020  PT End of Session - 02/29/20 0805    Visit Number  7    Number of Visits  13    Date for PT Re-Evaluation  03/21/20    Authorization Type  7    Authorization Time Period  of 10 progress report    PT Start Time  0805    PT Stop Time  0854    PT Time Calculation (min)  49 min    Activity Tolerance  Patient tolerated treatment well    Behavior During Therapy  [facility] for tasks assessed/performed       Past Medical History:  Diagnosis Date  . Allergic rhinitis   . Hyperlipidemia   . Hypertension   . OSA (obstructive sleep apnea)     Past Surgical History:  Procedure Laterality Date  . NASAL SEPTUM SURGERY     x2    There were no vitals filed for this visit.  Subjective Assessment - 02/29/20 0806    Subjective  R shoulder is pretty good. Did not hurt this morning. Able to raise it  up higher without pain. Stretch/pain at end range.    Pertinent History  Neck pain: R upper trap and proximal lateral arm pain. Pain started suddenly after Fathers day 2019. Pt reached towards his alarm clock and pain shot to his arm. Pain has improved but has not gone away. Pt was doing yardwork August 2020 and felt pain again B shoulders all the way down to his index and middle fingers bilaterally while performing yardwork. In 2019, pain was in the right side and could not botton his shirt. Had x-rays for neck and shoulders which showed a little bit or arthritis. Pt is R hand dominant. Has not yet had PT for current condition.    Patient Stated Goals  Make the pain go away.    Currently in Pain?  Yes    Pain Score  1     Pain Onset  More than a month ago                                PT Education - 02/29/20 0856    Education Details  ther-ex    Person(s) Educated  Patient    Methods  Explanation;Demonstration;Tactile cues;Verbal cues    Comprehension  Returned demonstration;Verbalized understanding      Objective    Blood pressure controlled per pt.  No latex band allergies  MedbridigeAccess Code: [email or web address]  Manual therapy  Seated STM R UT and rhomboid muscles  seated STM distal scalene muscle   Decreased end range discomfort with flexion and abduction   Therapeutic exercise  Seated press-ups 4x5 seconds  Then isometrics 10x2 with 5 second holds   R shoulder impingement with flexion and abduction    Seated B shoulder ER yellow band 10x3  Seated chin tucks 10x2 with 5 second holds, then 8x5 second holds to promote thoracic extension and scapular retraction   Seated manually resisted R elbow extension isometrics 10x5 seconds for 2 sets  Posterior translation of humeral head palpated  Decreased shoulder pain  Seated manually resisted scapular retraction targeting lower trap  muscles R 10x3 with 5 second holds    Add R triceps extension isometrics as part of HEP next visit if appropriate   Improved exercise technique, movement at target joints, use of target muscles aftermin tomod verbal, visual, tactile cues.     Response to treatment Pt tolerated session well without aggravation of symptoms.  Clinical impression Improving overall comfort level with shoulder flexion and abduction, with significant decrease in painful arch. Symptoms tend to be at end range currently which decreases with treatment to decrease upper trap tension, improve lower trap, and infraspinatus activation, improve posterior glide of R humeral head and improving thoracic extension to promote posterior tipping of his R scapular for better glenohumeral mechanics. Decreased R shoulder pain  at end range at end of session. Pt will benefit from continued skilled physical therapy services to decrease pain, improve strength, ROM, and function.     PT Short Term Goals - 02/06/20 1108      PT SHORT TERM GOAL #1   Title  Patient will be independent with his HEP to decrase pain, improve ability to reach and sleep on his R side more comfortably.    Baseline  Patient has started his HEP (02/06/2020)    Time  3    Period  Weeks    Status  New    Target Date  02/29/20        PT Long Term Goals - 02/06/20 1109      PT LONG TERM GOAL #1   Title  Patient will have a decrease in R shoulder pain to 1/10 or less at worst to promote ability to reach as well as sleep on his R side more comfortably.    Baseline  3/10 R shoulder pain at worst for the past 3 months (02/06/2020)    Time  6    Period  Weeks    Status  New    Target Date  03/21/20      PT LONG TERM GOAL #2   Title  Patient will improve R shoulder ER strength by 1/2 MMT grade to promote ability to raise his arm up with less pain.    Baseline  4/5 ER strength (02/16/2020)    Time  6    Period  Weeks    Status  New    Target Date  03/21/20      PT LONG TERM GOAL #3   Title  Pt will improve his neck FOTO score by at least 10 points as a demonstration of improved function.    Baseline  Neck FOTO: 66 (02/06/2020)    Time  6    Period  Weeks    Status  New    Target Date  03/21/20            Plan - 02/29/20 0856    Clinical Impression Statement  Improving overall comfort level with shoulder flexion and abduction, with significant decrease in painful arch. Symptoms tend to be at end range currently which decreases with treatment to decrease upper trap tension, improve lower trap, and infraspinatus activation, improve posterior glide of R humeral head and improving thoracic extension to promote posterior tipping of his R scapular for better glenohumeral mechanics. Decreased R shoulder pain at end range at end of session.  Pt will benefit from continued skilled physical therapy services to decrease pain, improve strength, ROM, and function.    Personal Factors and Comorbidities  Age;Fitness;Past/Current Experience;Time since onset of injury/illness/exacerbation    Examination-Activity Limitations  Bed Mobility;Sleep;Lift;Reach Overhead    Stability/Clinical Decision Making  Stable/Uncomplicated    Clinical Decision Making  Low    Rehab Potential  Fair    PT Frequency  2x / week    PT Duration  6 weeks    PT Treatment/Interventions  Therapeutic activities;Therapeutic exercise;Neuromuscular re-education;Patient/family education;Manual techniques;Dry needling;Joint Manipulations;Spinal Manipulations;Aquatic Therapy;Electrical Stimulation;Iontophoresis 4mg /ml Dexamethasone;Traction   traction and manipulation if appropriate   PT Next Visit Plan  scapular and ER muscle strengthening, thoracic extension, manual techniques, modalities PRN    Consulted and Agree with Plan of Care  Patient       Patient will benefit from skilled therapeutic intervention in order to improve the following deficits and impairments:  Pain, Postural dysfunction, Improper body mechanics, Decreased strength  Visit Diagnosis: Chronic right shoulder pain  Muscle weakness (generalized)     Problem List Patient Active Problem List   Diagnosis Date Noted  . COVID-19   . Hypoxia   . Pneumonia due to COVID-19 virus 11/16/2019  . Hypokalemia 11/16/2019  . Transaminitis 11/16/2019  . Seasonal and perennial allergic rhinitis 07/14/2017  . Adverse food reaction 01/18/2017  . Allergic rhinitis due to pollen 02/24/2011  . Obstructive sleep apnea 08/30/2009  . RHINOSINUSITIS, ACUTE 01/03/2009  . HYPERLIPIDEMIA 06/28/2008  . Essential hypertension 06/28/2008  . Chronic nonseasonal allergic rhinitis due to fungal spores 10/12/2007    10/14/2007 PT, DPT   02/29/2020, 8:58 AM  [facility] [facility] PHYSICAL AND  SPORTS MEDICINE [address], [address], [place] Phone: [phone]   Fax:  [phone]  Name: Ralph Ware MRN: Thamas Jaegers Date of Birth: 08-23-1956

## 2020-03-04 ENCOUNTER — Ambulatory Visit: Payer: Managed Care, Other (non HMO)

## 2020-03-04 ENCOUNTER — Other Ambulatory Visit: Payer: Self-pay

## 2020-03-04 DIAGNOSIS — M25511 Pain in right shoulder: Secondary | ICD-10-CM | POA: Diagnosis not present

## 2020-03-04 DIAGNOSIS — G8929 Other chronic pain: Secondary | ICD-10-CM

## 2020-03-04 DIAGNOSIS — M6281 Muscle weakness (generalized): Secondary | ICD-10-CM

## 2020-03-04 NOTE — Therapy (Signed)
[facility] [address], [place], [address] Phone: [phone]   Fax:  [phone]  Physical Therapy Treatment  Patient Details  Name: Ralph Ware MRN: [id number] Date of Birth: 15-May-1956 Referring Provider (PT): Andrey Spearman, MD   Encounter Date: 03/04/2020  PT End of Session - 03/04/20 1301    Visit Number  8    Number of Visits  13    Date for PT Re-Evaluation  03/21/20    Authorization Type  8    Authorization Time Period  of 10 progress report    PT Start Time  1301    PT Stop Time  1348    PT Time Calculation (min)  47 min    Activity Tolerance  Patient tolerated treatment well    Behavior During Therapy  [facility] for tasks assessed/performed       Past Medical History:  Diagnosis Date  . Allergic rhinitis   . Hyperlipidemia   . Hypertension   . OSA (obstructive sleep apnea)     Past Surgical History:  Procedure Laterality Date  . NASAL SEPTUM SURGERY     x2    There were no vitals filed for this visit.  Subjective Assessment - 03/04/20 1302    Subjective  R shoulder was sore this morning. Had a couple of twinges on and off. Right now its ok.    Pertinent History  Neck pain: R upper trap and proximal lateral arm pain. Pain started suddenly after Fathers day 2019. Pt reached towards his alarm clock and pain shot to his arm. Pain has improved but has not gone away. Pt was doing yardwork August 2020 and felt pain again B shoulders all the way down to his index and middle fingers bilaterally while performing yardwork. In 2019, pain was in the right side and could not botton his shirt. Had x-rays for neck and shoulders which showed a little bit or arthritis. Pt is R hand dominant. Has not yet had PT for current condition.    Patient Stated Goals  Make the pain go away.    Currently in Pain?  No/denies    Pain Score  0-No pain    Pain Onset  More than a month ago                                PT Education - 03/04/20 1305    Education Details  ther-ex    Person(s) Educated  Patient    Methods  Explanation;Demonstration;Tactile cues;Verbal cues    Comprehension  Returned demonstration;Verbalized understanding      Objective    Blood pressure controlled per pt.  No latex band allergies  MedbridigeAccess Code: [email or web address]  Manual therapy  Seated STM L  Rhomboid muscles   seated STM R distal scalene muscle   Decreased end range discomfort with flexion and abduction   Therapeutic exercise    Seated manually resisted R elbow extension isometrics 10x5 seconds for 3 sets             Posterior translation of humeral head palpated             Decreased shoulder pain  R triceps extension blue band 10x5 seconds   Then 10x10 seconds for 2 sets  Decreased R shoulder pain with flexion and abduction.   Seated manually resisted R scapular retraction targeting the lower trap muscle 10x3 with 5 second  holds   R first rib stretch with PT manual caudal pressure 15 seconds x 5   R self first rib stretch with strap 15 seconds x 5  Reviewed and given as part of HEP. Pt demonstrated and verbalized understanding.   R shoulder flexion and abduction multiple times to assess effectiveness of treatment   Improved exercise technique, movement at target joints, use of target muscles aftermin tomod verbal, visual, tactile cues.     Response to treatment Pt tolerated session well without aggravation of symptoms.  Clinical impression Decreased pain with R shoulder flexion and abduction with treatment to promote posterior translation of humeral head as well as improving scapular strength. Pt making good progress with ability to raise his arm with less pain. Pt tolerated session well without aggravation of symptoms. Pt will benefit from continued skilled physical therapy services to decrease pain, improve  strength, function and ability to raise his arm up more comfortably.      PT Short Term Goals - 02/06/20 1108      PT SHORT TERM GOAL #1   Title  Patient will be independent with his HEP to decrase pain, improve ability to reach and sleep on his R side more comfortably.    Baseline  Patient has started his HEP (02/06/2020)    Time  3    Period  Weeks    Status  New    Target Date  02/29/20        PT Long Term Goals - 02/06/20 1109      PT LONG TERM GOAL #1   Title  Patient will have a decrease in R shoulder pain to 1/10 or less at worst to promote ability to reach as well as sleep on his R side more comfortably.    Baseline  3/10 R shoulder pain at worst for the past 3 months (02/06/2020)    Time  6    Period  Weeks    Status  New    Target Date  03/21/20      PT LONG TERM GOAL #2   Title  Patient will improve R shoulder ER strength by 1/2 MMT grade to promote ability to raise his arm up with less pain.    Baseline  4/5 ER strength (02/16/2020)    Time  6    Period  Weeks    Status  New    Target Date  03/21/20      PT LONG TERM GOAL #3   Title  Pt will improve his neck FOTO score by at least 10 points as a demonstration of improved function.    Baseline  Neck FOTO: 66 (02/06/2020)    Time  6    Period  Weeks    Status  New    Target Date  03/21/20            Plan - 03/04/20 1305    Clinical Impression Statement  Decreased pain with R shoulder flexion and abduction with treatment to promote posterior translation of humeral head as well as improving scapular strength. Pt making good progress with ability to raise his arm with less pain. Pt tolerated session well without aggravation of symptoms. Pt will benefit from continued skilled physical therapy services to decrease pain, improve strength, function and ability to raise his arm up more comfortably.    Personal Factors and Comorbidities  Age;Fitness;Past/Current Experience;Time since onset of  injury/illness/exacerbation    Examination-Activity Limitations  Bed Mobility;Sleep;Lift;Reach Overhead    Stability/Clinical  Decision Making  Stable/Uncomplicated    Rehab Potential  Fair    PT Frequency  2x / week    PT Duration  6 weeks    PT Treatment/Interventions  Therapeutic activities;Therapeutic exercise;Neuromuscular re-education;Patient/family education;Manual techniques;Dry needling;Joint Manipulations;Spinal Manipulations;Aquatic Therapy;Electrical Stimulation;Iontophoresis 4mg /ml Dexamethasone;Traction   traction and manipulation if appropriate   PT Next Visit Plan  scapular and ER muscle strengthening, thoracic extension, manual techniques, modalities PRN    Consulted and Agree with Plan of Care  Patient       Patient will benefit from skilled therapeutic intervention in order to improve the following deficits and impairments:  Pain, Postural dysfunction, Improper body mechanics, Decreased strength  Visit Diagnosis: Muscle weakness (generalized)  Chronic right shoulder pain     Problem List Patient Active Problem List   Diagnosis Date Noted  . COVID-19   . Hypoxia   . Pneumonia due to COVID-19 virus 11/16/2019  . Hypokalemia 11/16/2019  . Transaminitis 11/16/2019  . Seasonal and perennial allergic rhinitis 07/14/2017  . Adverse food reaction 01/18/2017  . Allergic rhinitis due to pollen 02/24/2011  . Obstructive sleep apnea 08/30/2009  . RHINOSINUSITIS, ACUTE 01/03/2009  . HYPERLIPIDEMIA 06/28/2008  . Essential hypertension 06/28/2008  . Chronic nonseasonal allergic rhinitis due to fungal spores 10/12/2007      10/14/2007 PT, DPT  03/04/2020, 2:15 PM  [facility] [address], [address], [place] Phone: [phone]   Fax:  [phone]  Name: Ralph Ware MRN: Thamas Jaegers Date of Birth: 01/15/1956

## 2020-03-04 NOTE — Patient Instructions (Addendum)
  Access Code: Berenice Primas URL: [email or web address] Date: 03/04/2020 Prepared by: Loralyn Freshwater  Exercises Shoulder W - External Rotation with Resistance - 1 x daily - 7 x weekly - 10 reps - 3 sets Sleeper Stretch - 1 x daily - 7 x weekly - 10 reps - 3 sets - 5 seconds hold Standing Shoulder Posterior Capsule Stretch - 1 x daily - 7 x weekly - 5 reps - 2 sets - 15 seconds hold Single Arm Doorway Pec Stretch at 90 Degrees Abduction - 3 x daily - 7 x weekly - 3 reps - 1 sets - 30 seconds hold Supine PNF D2 Flexion with Resistance - 1 x daily - 7 x weekly - 10 reps - 3 sets Standing Bilateral Elbow Extension with Anchored Resistance - 1 x daily - 7 x weekly - 3 sets - 10 reps - 5-10 seconds hold First Rib Mobilization with Strap - 3 x daily - 7 x weekly - 1 sets - 5 reps - 15 seconds hold

## 2020-03-06 ENCOUNTER — Ambulatory Visit: Payer: Managed Care, Other (non HMO)

## 2020-03-06 ENCOUNTER — Other Ambulatory Visit: Payer: Self-pay

## 2020-03-06 DIAGNOSIS — M25511 Pain in right shoulder: Secondary | ICD-10-CM | POA: Diagnosis not present

## 2020-03-06 DIAGNOSIS — G8929 Other chronic pain: Secondary | ICD-10-CM

## 2020-03-06 DIAGNOSIS — M6281 Muscle weakness (generalized): Secondary | ICD-10-CM

## 2020-03-06 NOTE — Therapy (Signed)
[facility] [address], [place], [address] Phone: [phone]   Fax:  [phone]  Physical Therapy Treatment  Patient Details  Name: Ralph Ware MRN: [id number] Date of Birth: 1956-01-09 Referring Provider (PT): Andrey Spearman, MD   Encounter Date: 03/06/2020  PT End of Session - 03/06/20 1301    Visit Number  9    Number of Visits  13    Date for PT Re-Evaluation  03/21/20    Authorization Type  9    Authorization Time Period  of 10 progress report    PT Start Time  1302    PT Stop Time  1343    PT Time Calculation (min)  41 min    Activity Tolerance  Patient tolerated treatment well    Behavior During Therapy  [facility] for tasks assessed/performed       Past Medical History:  Diagnosis Date  . Allergic rhinitis   . Hyperlipidemia   . Hypertension   . OSA (obstructive sleep apnea)     Past Surgical History:  Procedure Laterality Date  . NASAL SEPTUM SURGERY     x2    There were no vitals filed for this visit.  Subjective Assessment - 03/06/20 1303    Subjective  R shoulder just has a little pain at the top (end range abduction)    Pertinent History  Neck pain: R upper trap and proximal lateral arm pain. Pain started suddenly after Fathers day 2019. Pt reached towards his alarm clock and pain shot to his arm. Pain has improved but has not gone away. Pt was doing yardwork August 2020 and felt pain again B shoulders all the way down to his index and middle fingers bilaterally while performing yardwork. In 2019, pain was in the right side and could not botton his shirt. Had x-rays for neck and shoulders which showed a little bit or arthritis. Pt is R hand dominant. Has not yet had PT for current condition.    Patient Stated Goals  Make the pain go away.    Currently in Pain?  Yes    Pain Score  1    at end range abduction and flexion.   Pain Onset  More than a month ago                                PT Education - 03/06/20 1306    Education Details  ther-ex    Person(s) Educated  Patient    Methods  Explanation;Demonstration;Tactile cues;Verbal cues    Comprehension  Returned demonstration;Verbalized understanding      Objective    Blood pressure controlled per pt.  No latex band allergies  MedbridigeAccess Code: [email or web address]   Therapeutic exercise  R cross arm stretch 30 seconds x 3   R triceps extension blue band 10x,             Then 10x10 seconds              Decreased R shoulder pain with reaching behind back  Standing R shoulder horizontal abduction 12 x blue band   Decreased pain with reaching behind back  R UT stretch 15 seconds x 5   R first rib stretch with PT manual caudal pressure 15 seconds x 5  R shoulder extension isometrics in neutral with PT 8x5 seconds   R anterior shoulder symptoms  R shoulder in neutral,  manually resisted extension, triceps extension and ER 10x5 seconds for 2 sets  R shoulder flexion with manually resisted triceps extension and ER from PT 10x2  Then with addition of manually resisted shoulder extension 10x2  Decreased pain with reaching back  Seated manually resisted R scapular retraction targeting lower trap muscle   10x5 seconds for 2 sets      R shoulder flexion, abduction, and reaching behind back multiple times to assess effectiveness of treatment      Improved exercise technique, movement at target joints, use of target muscles aftermin tomod verbal, visual, tactile cues.     Response to treatment Pt tolerated session well without aggravation of symptoms.  Clinical impression Improving ability to raise his R arm up into flexion and abduction as well as reaching behind back with decreased R shoulder pain. Continued working on improving R scapular, posterior shoulder, rotator cuff strength to promote proper glenohumeral mechanics to decrease  anterior stress to his shoulder. Improved ability to reach behind his back, raise his R arm up into flexion and abduction with less pain. Pt will benefit from continued skilled physicla therapy services to decrease pain, improve ROM strength and function.      PT Short Term Goals - 02/06/20 1108      PT SHORT TERM GOAL #1   Title  Patient will be independent with his HEP to decrase pain, improve ability to reach and sleep on his R side more comfortably.    Baseline  Patient has started his HEP (02/06/2020)    Time  3    Period  Weeks    Status  New    Target Date  02/29/20        PT Long Term Goals - 02/06/20 1109      PT LONG TERM GOAL #1   Title  Patient will have a decrease in R shoulder pain to 1/10 or less at worst to promote ability to reach as well as sleep on his R side more comfortably.    Baseline  3/10 R shoulder pain at worst for the past 3 months (02/06/2020)    Time  6    Period  Weeks    Status  New    Target Date  03/21/20      PT LONG TERM GOAL #2   Title  Patient will improve R shoulder ER strength by 1/2 MMT grade to promote ability to raise his arm up with less pain.    Baseline  4/5 ER strength (02/16/2020)    Time  6    Period  Weeks    Status  New    Target Date  03/21/20      PT LONG TERM GOAL #3   Title  Pt will improve his neck FOTO score by at least 10 points as a demonstration of improved function.    Baseline  Neck FOTO: 66 (02/06/2020)    Time  6    Period  Weeks    Status  New    Target Date  03/21/20            Plan - 03/06/20 1301    Clinical Impression Statement  Improving ability to raise his R arm up into flexion and abduction as well as reaching behind back with decreased R shoulder pain. Continued working on improving R scapular, posterior shoulder, rotator cuff strength to promote proper glenohumeral mechanics to decrease anterior stress to his shoulder. Improved ability to reach behind his back, raise his  R arm up into flexion  and abduction with less pain. Pt will benefit from continued skilled physicla therapy services to decrease pain, improve ROM strength and function.    Personal Factors and Comorbidities  Age;Fitness;Past/Current Experience;Time since onset of injury/illness/exacerbation    Examination-Activity Limitations  Bed Mobility;Sleep;Lift;Reach Overhead    Stability/Clinical Decision Making  Stable/Uncomplicated    Rehab Potential  Fair    PT Frequency  2x / week    PT Duration  6 weeks    PT Treatment/Interventions  Therapeutic activities;Therapeutic exercise;Neuromuscular re-education;Patient/family education;Manual techniques;Dry needling;Joint Manipulations;Spinal Manipulations;Aquatic Therapy;Electrical Stimulation;Iontophoresis 4mg /ml Dexamethasone;Traction   traction and manipulation if appropriate   PT Next Visit Plan  scapular and ER muscle strengthening, thoracic extension, manual techniques, modalities PRN    Consulted and Agree with Plan of Care  Patient       Patient will benefit from skilled therapeutic intervention in order to improve the following deficits and impairments:  Pain, Postural dysfunction, Improper body mechanics, Decreased strength  Visit Diagnosis: Muscle weakness (generalized)  Chronic right shoulder pain     Problem List Patient Active Problem List   Diagnosis Date Noted  . COVID-19   . Hypoxia   . Pneumonia due to COVID-19 virus 11/16/2019  . Hypokalemia 11/16/2019  . Transaminitis 11/16/2019  . Seasonal and perennial allergic rhinitis 07/14/2017  . Adverse food reaction 01/18/2017  . Allergic rhinitis due to pollen 02/24/2011  . Obstructive sleep apnea 08/30/2009  . RHINOSINUSITIS, ACUTE 01/03/2009  . HYPERLIPIDEMIA 06/28/2008  . Essential hypertension 06/28/2008  . Chronic nonseasonal allergic rhinitis due to fungal spores 10/12/2007    10/14/2007 PT, DPT   03/06/2020, 3:06 PM  [facility]  [facility]IC[facility] [address], [place] Phone: [phone]   Fax:  [phone]  Name: Ralph Ware MRN: Thamas Jaegers Date of Birth: [birth year]

## 2020-03-11 ENCOUNTER — Other Ambulatory Visit: Payer: Self-pay

## 2020-03-11 ENCOUNTER — Ambulatory Visit: Payer: Managed Care, Other (non HMO)

## 2020-03-11 DIAGNOSIS — M25511 Pain in right shoulder: Secondary | ICD-10-CM | POA: Diagnosis not present

## 2020-03-11 DIAGNOSIS — M6281 Muscle weakness (generalized): Secondary | ICD-10-CM

## 2020-03-11 DIAGNOSIS — G8929 Other chronic pain: Secondary | ICD-10-CM

## 2020-03-11 NOTE — Therapy (Signed)
[place] [facility] [address], [place], [address] Phone: [phone]   Fax:  [phone]  Physical Therapy Treatment And Progress Report (02/06/2020 - 03/11/2020)  Patient Details  Name: Ralph Ware MRN: [id number] Date of Birth: [birth year] Referring Provider (PT): Andrey Spearman, MD   Encounter Date: 03/11/2020  PT End of Session - 03/11/20 1259    Visit Number  10    Number of Visits  13    Date for PT Re-Evaluation  03/21/20    Authorization Type  10    Authorization Time Period  of 10 progress report    PT Start Time  1259    PT Stop Time  1348    PT Time Calculation (min)  49 min    Activity Tolerance  Patient tolerated treatment well    Behavior During Therapy  [facility] for tasks assessed/performed       Past Medical History:  Diagnosis Date  . Allergic rhinitis   . Hyperlipidemia   . Hypertension   . OSA (obstructive sleep apnea)     Past Surgical History:  Procedure Laterality Date  . NASAL SEPTUM SURGERY     x2    There were no vitals filed for this visit.  Subjective Assessment - 03/11/20 1301    Subjective  R shoulder is not too bad, a little pain every now and then. Still has a hard time sleeping on his R side. 1/10 R shoulder pain at most for the past 7 days.    Pertinent History  Neck pain: R upper trap and proximal lateral arm pain. Pain started suddenly after Fathers day 2019. Pt reached towards his alarm clock and pain shot to his arm. Pain has improved but has not gone away. Pt was doing yardwork August 2020 and felt pain again B shoulders all the way down to his index and middle fingers bilaterally while performing yardwork. In 2019, pain was in the right side and could not botton his shirt. Had x-rays for neck and shoulders which showed a little bit or arthritis. Pt is R hand dominant. Has not yet had PT for current condition.    Patient Stated Goals  Make the pain go away.    Currently in Pain?  Yes    Pain Score  1    with abduction   Pain Onset  More than a month ago         [facility] PT Assessment - 03/11/20 1305      Observation/Other Assessments   Focus on Therapeutic Outcomes (FOTO)   Neck FOTO: 66      Strength   Right Shoulder External Rotation  5/5                           PT Education - 03/12/20 1312    Education Details  ther-ex, HEP    Person(s) Educated  Patient    Methods  Explanation;Demonstration;Tactile cues;Verbal cues    Comprehension  Returned demonstration;Verbalized understanding      Objective    Blood pressure controlled per pt.  No latex band allergies  MedbridigeAccess Code: [email or web address]   Manual therapy  Seated STM R distal scalene area Seated STM R rhomboid and upper trap     Therapeutic exercise  Standing manually resisted R shoulder ER 1x   Seated manually resisted R scapular retraction targeting lower trap muscle  10x5 seconds for 2 sets   Seated R shoulder flexion with manually resisted R shoulder ER 10x3  Seated manually resisted R elbow extension isometrics 10x5 seconds for 3 sets   Seated R scapular depression isometrics with R forearm on arm rest 10x5 seconds  For 2 sets   R first rib stretch with PT manual caudal pressure 15 seconds x 5 for 2 sets   Decreased R shoulder discomfort with abduction      R shoulder flexion, and abduction  multiple times to assess effectiveness of treatment      Improved exercise technique, movement at target joints, use of target muscles aftermin tomod verbal, visual, tactile cues.     Response to treatment Pt tolerated session well without aggravation of symptoms.  Clinical impression   Pt demonstrates overall improved R shoulder pain, improved infraspinatus muscle strength, and improved ability to raise his R arm forward and to the side more comfortably since initial evaluation. Neck FOTO score  remains the same in which source of actual pain is in the shoulder joint may play a factor in the responses. Pt making good progress with PT towards goals. Pt will benefit from continued skilled physical therapy servcies to continue to decrease pain, improve strength and function.       PT Short Term Goals - 03/12/20 1313      PT SHORT TERM GOAL #1   Title  Patient will be independent with his HEP to decrase pain, improve ability to reach and sleep on his R side more comfortably.    Baseline  Patient has started his HEP (02/06/2020); Pt performing his HEP (03/11/20)    Time  3    Period  Weeks    Status  Achieved    Target Date  02/29/20        PT Long Term Goals - 03/11/20 1306      PT LONG TERM GOAL #1   Title  Patient will have a decrease in R shoulder pain to 1/10 or less at worst to promote ability to reach as well as sleep on his R side more comfortably.    Baseline  3/10 R shoulder pain at worst for the past 3 months (02/06/2020); 1/10 (03/11/2020)    Time  6    Period  Weeks    Status  Achieved    Target Date  03/21/20      PT LONG TERM GOAL #2   Title  Patient will improve R shoulder ER strength by 1/2 MMT grade to promote ability to raise his arm up with less pain.    Baseline  4/5 ER strength (02/16/2020); 5/5 ER (03/11/2020)    Time  6    Period  Weeks    Status  Achieved    Target Date  03/21/20      PT LONG TERM GOAL #3   Title  Pt will improve his neck FOTO score by at least 10 points as a demonstration of improved function.    Baseline  Neck FOTO: 66 (02/06/2020), (03/11/20)    Time  6    Period  Weeks    Status  On-going    Target Date  03/21/20            Plan - 03/11/20 1259    Clinical Impression Statement  Pt demonstrates overall improved R shoulder pain, improved infraspinatus muscle strength, and improved ability to raise his R arm forward and to the side more comfortably since initial evaluation. Neck  FOTO score remains the same in which source of  actual pain is in the shoulder joint may play a factor in the responses. Pt making good progress with PT towards goals. Pt will benefit from continued skilled physical therapy servcies to continue to decrease pain, improve strength and function.    Personal Factors and Comorbidities  Age;Fitness;Past/Current Experience;Time since onset of injury/illness/exacerbation    Examination-Activity Limitations  Bed Mobility;Sleep;Lift;Reach Overhead    Stability/Clinical Decision Making  Stable/Uncomplicated    Rehab Potential  Fair    PT Frequency  2x / week    PT Duration  6 weeks    PT Treatment/Interventions  Therapeutic activities;Therapeutic exercise;Neuromuscular re-education;Patient/family education;Manual techniques;Dry needling;Joint Manipulations;Spinal Manipulations;Aquatic Therapy;Electrical Stimulation;Iontophoresis 4mg /ml Dexamethasone;Traction   traction and manipulation if appropriate   PT Next Visit Plan  scapular and ER muscle strengthening, thoracic extension, manual techniques, modalities PRN    Consulted and Agree with Plan of Care  Patient       Patient will benefit from skilled therapeutic intervention in order to improve the following deficits and impairments:  Pain, Postural dysfunction, Improper body mechanics, Decreased strength  Visit Diagnosis: Muscle weakness (generalized)  Chronic right shoulder pain     Problem List Patient Active Problem List   Diagnosis Date Noted  . COVID-19   . Hypoxia   . Pneumonia due to COVID-19 virus 11/16/2019  . Hypokalemia 11/16/2019  . Transaminitis 11/16/2019  . Seasonal and perennial allergic rhinitis 07/14/2017  . Adverse food reaction 01/18/2017  . Allergic rhinitis due to pollen 02/24/2011  . Obstructive sleep apnea 08/30/2009  . RHINOSINUSITIS, ACUTE 01/03/2009  . HYPERLIPIDEMIA 06/28/2008  . Essential hypertension 06/28/2008  . Chronic nonseasonal allergic rhinitis due to fungal spores 10/12/2007    Thank you for  your referral.  10/14/2007 PT, DPT   03/12/2020, 1:33 PM  [facility] [address], [address], [place] Phone: [phone]   Fax:  [phone]  Name: Ralph Ware MRN: Thamas Jaegers Date of Birth: [birth year]

## 2020-03-13 ENCOUNTER — Other Ambulatory Visit: Payer: Self-pay

## 2020-03-13 ENCOUNTER — Ambulatory Visit: Payer: Managed Care, Other (non HMO)

## 2020-03-13 DIAGNOSIS — G8929 Other chronic pain: Secondary | ICD-10-CM

## 2020-03-13 DIAGNOSIS — M25511 Pain in right shoulder: Secondary | ICD-10-CM | POA: Diagnosis not present

## 2020-03-13 DIAGNOSIS — M6281 Muscle weakness (generalized): Secondary | ICD-10-CM

## 2020-03-13 NOTE — Therapy (Signed)
[facility] [address], [place], [address] Phone: [phone]   Fax:  [phone]  Physical Therapy Treatment  Patient Details  Name: Ralph Ware MRN: [id number] Date of Birth: 11/01/1956 Referring Provider (PT): Joycelyn Schmid, MD   Encounter Date: 03/13/2020  PT End of Session - 03/13/20 1356    Visit Number  11    Number of Visits  13    Date for PT Re-Evaluation  03/21/20    Authorization Type  1    Authorization Time Period  of 10 progress report    PT Start Time  1357    PT Stop Time  1436    PT Time Calculation (min)  39 min    Activity Tolerance  Patient tolerated treatment well    Behavior During Therapy  [facility] for tasks assessed/performed       Past Medical History:  Diagnosis Date  . Allergic rhinitis   . Hyperlipidemia   . Hypertension   . OSA (obstructive sleep apnea)     Past Surgical History:  Procedure Laterality Date  . NASAL SEPTUM SURGERY     x2    There were no vitals filed for this visit.  Subjective Assessment - 03/13/20 1358    Subjective  R shoulder hurts today just doing routine stuff. Might be straining a little too much with his home exercise. 0.5/10 at rest, 1.5/10 when rasing his R arm up to the side.    Pertinent History  Neck pain: R upper trap and proximal lateral arm pain. Pain started suddenly after Fathers day 2019. Pt reached towards his alarm clock and pain shot to his arm. Pain has improved but has not gone away. Pt was doing yardwork August 2020 and felt pain again B shoulders all the way down to his index and middle fingers bilaterally while performing yardwork. In 2019, pain was in the right side and could not botton his shirt. Had x-rays for neck and shoulders which showed a little bit or arthritis. Pt is R hand dominant. Has not yet had PT for current condition.    Patient Stated Goals  Make the pain go away.    Currently in Pain?  Yes    Pain Score  2      Pain Onset  More than a month ago                               PT Education - 03/13/20 1420    Education Details  ther-ex    [employer]) Educated  Patient    Methods  Explanation;Tactile cues;Verbal cues;Demonstration    Comprehension  Returned demonstration;Verbalized understanding      Objective    Blood pressure controlled per pt.  No latex band allergies  MedbridigeAccess Code: [email or web address]   Manual therapy   Seated STM R upper trap Seated STM R pectoralis major Seated STM R teres major  Decreased muscle tension and decreased pain with R shoulder flexion and abduction afterwards      Therapeutic exercise   Seated manually resisted R elbow flexion/shoulder extension isometrics 10x5 seconds for 3 sets to promote posterior glide of humeral head  seated manually resisted R shoulder ER isometrics in neutral  10x3 with 5 second holds  Decreased pain with shoulder abduction  R shoudler abduction with PT assist for proper scapular movement  10x2  R elbow extension isometrics 5 seconds  Reviewed  and given as part of his HEP. Pt demonstrated and verbalized understanding.    R shoulder flexion, abduction multiple times to assess effectiveness of treatment     Improved exercise technique, movement at target joints, use of target muscles aftermin tomod verbal, visual, tactile cues.     Response to treatment Pt tolerated session well without aggravation of symptoms.  Clinical impression Continued working on decreasing upper trap, pectoralis major, and teres major muscle tension to promote better scapular retraction, posterior tipping and abduction when raising his arm forward and to the side. Continued working on infraspinatus and triceps muscle strengthening to help promote posterior glide of the humeral head with movement. Improved ability to perform R shoulder flexion and abduction more comfortably after session. Pt will  benefit from continued skilled physical therapy services to decrease pain, muscle tension, improve strength, and function.      PT Short Term Goals - 03/12/20 1313      PT SHORT TERM GOAL #1   Title  Patient will be independent with his HEP to decrase pain, improve ability to reach and sleep on his R side more comfortably.    Baseline  Patient has started his HEP (02/06/2020); Pt performing his HEP (03/11/20)    Time  3    Period  Weeks    Status  Achieved    Target Date  02/29/20        PT Long Term Goals - 03/11/20 1306      PT LONG TERM GOAL #1   Title  Patient will have a decrease in R shoulder pain to 1/10 or less at worst to promote ability to reach as well as sleep on his R side more comfortably.    Baseline  3/10 R shoulder pain at worst for the past 3 months (02/06/2020); 1/10 (03/11/2020)    Time  6    Period  Weeks    Status  Achieved    Target Date  03/21/20      PT LONG TERM GOAL #2   Title  Patient will improve R shoulder ER strength by 1/2 MMT grade to promote ability to raise his arm up with less pain.    Baseline  4/5 ER strength (02/16/2020); 5/5 ER (03/11/2020)    Time  6    Period  Weeks    Status  Achieved    Target Date  03/21/20      PT LONG TERM GOAL #3   Title  Pt will improve his neck FOTO score by at least 10 points as a demonstration of improved function.    Baseline  Neck FOTO: 66 (02/06/2020), (03/11/20)    Time  6    Period  Weeks    Status  On-going    Target Date  03/21/20            Plan - 03/13/20 1424    Clinical Impression Statement  Continued working on decreasing upper trap, pectoralis major, and teres major muscle tension to promote better scapular retraction, posterior tipping and abduction when raising his arm forward and to the side. Continued working on infraspinatus and triceps muscle strengthening to help promote posterior glide of the humeral head with movement. Improved ability to perform R shoulder flexion and abduction  more comfortably after session. Pt will benefit from continued skilled physical therapy services to decrease pain, muscle tension, improve strength, and function.    Personal Factors and Comorbidities  Age;Fitness;Past/Current Experience;Time since onset of injury/illness/exacerbation    Examination-Activity  Limitations  Bed Mobility;Sleep;Lift;Reach Overhead    Stability/Clinical Decision Making  Stable/Uncomplicated    Rehab Potential  Fair    PT Frequency  2x / week    PT Duration  6 weeks    PT Treatment/Interventions  Therapeutic activities;Therapeutic exercise;Neuromuscular re-education;Patient/family education;Manual techniques;Dry needling;Joint Manipulations;Spinal Manipulations;Aquatic Therapy;Electrical Stimulation;Iontophoresis 4mg /ml Dexamethasone;Traction   traction and manipulation if appropriate   PT Next Visit Plan  scapular and ER muscle strengthening, thoracic extension, manual techniques, modalities PRN    Consulted and Agree with Plan of Care  Patient       Patient will benefit from skilled therapeutic intervention in order to improve the following deficits and impairments:  Pain, Postural dysfunction, Improper body mechanics, Decreased strength  Visit Diagnosis: Muscle weakness (generalized)  Chronic right shoulder pain     Problem List Patient Active Problem List   Diagnosis Date Noted  . COVID-19   . Hypoxia   . Pneumonia due to COVID-19 virus 11/16/2019  . Hypokalemia 11/16/2019  . Transaminitis 11/16/2019  . Seasonal and perennial allergic rhinitis 07/14/2017  . Adverse food reaction 01/18/2017  . Allergic rhinitis due to pollen 02/24/2011  . Obstructive sleep apnea 08/30/2009  . RHINOSINUSITIS, ACUTE 01/03/2009  . HYPERLIPIDEMIA 06/28/2008  . Essential hypertension 06/28/2008  . Chronic nonseasonal allergic rhinitis due to fungal spores 10/12/2007    Joneen Boers PT, DPT   03/13/2020, 5:53 PM  [facility]  [facility] [address] [place], [address] Phone: [phone]   Fax:  [phone]  Name: Ralph Ware MRN: [id number] Date of Birth: 1956-01-16

## 2020-03-13 NOTE — Patient Instructions (Signed)
Access Code: Berenice Primas URL: [email or web address] Date: 03/13/2020 Prepared by: Loralyn Freshwater  Exercises Shoulder W - External Rotation with Resistance - 1 x daily - 7 x weekly - 10 reps - 3 sets Sleeper Stretch - 1 x daily - 7 x weekly - 10 reps - 3 sets - 5 seconds hold Standing Shoulder Posterior Capsule Stretch - 1 x daily - 7 x weekly - 5 reps - 2 sets - 15 seconds hold Single Arm Doorway Pec Stretch at 90 Degrees Abduction - 3 x daily - 7 x weekly - 3 reps - 1 sets - 30 seconds hold Supine PNF D2 Flexion with Resistance - 1 x daily - 7 x weekly - 10 reps - 3 sets Standing Bilateral Elbow Extension with Anchored Resistance - 1 x daily - 7 x weekly - 3 sets - 10 reps - 5-10 seconds hold First Rib Mobilization with Strap - 3 x daily - 7 x weekly - 1 sets - 5 reps - 15 seconds hold Seated Isometric Elbow Extension - 1 x daily - 7 x weekly - 3 sets - 10 reps - 5 seconds hold

## 2020-03-16 ENCOUNTER — Ambulatory Visit: Payer: Managed Care, Other (non HMO) | Attending: Internal Medicine

## 2020-03-16 DIAGNOSIS — Z23 Encounter for immunization: Secondary | ICD-10-CM

## 2020-03-16 NOTE — Progress Notes (Signed)
   Covid-19 Vaccination Clinic  Name:  Ralph Ware    MRN: [id number] DOB: May 06, 1956  03/16/2020  Ralph Ware was observed post Covid-19 immunization for 15 minutes without incident. He was provided with Vaccine Information Sheet and instruction to access the V-Safe system.   Ralph Ware was instructed to call 911 with any severe reactions post vaccine: Marland Kitchen Difficulty breathing  . Swelling of face and throat  . A fast heartbeat  . A bad rash all over body  . Dizziness and weakness   Immunizations Administered    Name Date Dose VIS Date Route   Moderna COVID-19 Vaccine 03/16/2020  1:04 PM 0.5 mL 11/21/2019 Intramuscular   Manufacturer: Gala Murdoch   Lot: [id number]   NDC: [id number]

## 2020-03-20 ENCOUNTER — Other Ambulatory Visit: Payer: Self-pay

## 2020-03-20 ENCOUNTER — Ambulatory Visit: Payer: Managed Care, Other (non HMO)

## 2020-03-20 DIAGNOSIS — M25511 Pain in right shoulder: Secondary | ICD-10-CM | POA: Diagnosis not present

## 2020-03-20 DIAGNOSIS — M6281 Muscle weakness (generalized): Secondary | ICD-10-CM

## 2020-03-20 DIAGNOSIS — G8929 Other chronic pain: Secondary | ICD-10-CM

## 2020-03-20 NOTE — Therapy (Signed)
[place] [facility] [address], [place], [address] Phone: [phone]   Fax:  [phone]  Physical Therapy Treatment  Patient Details  Name: Ralph Ware MRN: [id number] Date of Birth: 14-Jul-1956 Referring Provider (PT): Joycelyn Schmid, MD   Encounter Date: 03/20/2020  PT End of Session - 03/20/20 1350    Visit Number  12    Number of Visits  19    Date for PT Re-Evaluation  04/11/20    Authorization Type  2    Authorization Time Period  of 10 progress report    PT Start Time  1350    PT Stop Time  1431    PT Time Calculation (min)  41 min    Activity Tolerance  Patient tolerated treatment well    Behavior During Therapy  [facility] for tasks assessed/performed       Past Medical History:  Diagnosis Date  . Allergic rhinitis   . Hyperlipidemia   . Hypertension   . OSA (obstructive sleep apnea)     Past Surgical History:  Procedure Laterality Date  . NASAL SEPTUM SURGERY     x2    There were no vitals filed for this visit.  Subjective Assessment - 03/20/20 1351    Subjective  Got his second COVID shot Saturday 03/16/20. R arm does not hurt today from the shot. R shoulder is not too bad. Pain does not occur until end range abduction. No pain with flexion to end range. Kept doing his HEP until his shot. 1/10 R shoulder pain at most for the past 7days.    Pertinent History  Neck pain: R upper trap and proximal lateral arm pain. Pain started suddenly after Fathers day 2019. Pt reached towards his alarm clock and pain shot to his arm. Pain has improved but has not gone away. Pt was doing yardwork August 2020 and felt pain again B shoulders all the way down to his index and middle fingers bilaterally while performing yardwork. In 2019, pain was in the right side and could not botton his shirt. Had x-rays for neck and shoulders which showed a little bit or arthritis. Pt is R hand dominant. Has not yet had PT for  current condition.    Patient Stated Goals  Make the pain go away.    Currently in Pain?  Yes    Pain Score  1    0.5/10   Pain Onset  More than a month ago                               PT Education - 03/20/20 1402    Education Details  ther-ex    Person(s) Educated  Patient    Methods  Explanation;Demonstration;Tactile cues;Verbal cues    Comprehension  Returned demonstration;Verbalized understanding       Objective    Blood pressure controlled per pt.  No latex band allergies  MedbridigeAccess Code: [email or web address]     Therapeutic exercise  Standing R elbow extension isometrics holding door knob   10x3 with 5 second holds   Standing R shoulder extension isometrics holding door knob  10x3 with 5 second holds   Standing R shoulder horizontal abduction red band 10x3   Standing PT manually resisted R shoulder horizontal abduction with chin tuck 10x3  R shoulder IR concentric and eccentric contraction 10x3 No pain reaching behind his back   Reviewed and planned out  plan of care with pt: continue with PT for 2x/week for 3 weeks to continue progress then continue with HEP for a month and return for a follow up to check up on progress.    Improved exercise technique, movement at target joints, use of target muscles aftermin tomod verbal, visual, tactile cues.     Response to treatment Pt tolerated session well without aggravation of symptoms.  Clinical impression Pt progressing very well with decreased R shoulder pain with pt able to perform R shoulder flexion pain free and R shoulder abduction with only 0.5/10 pain at end range today. Pt also reports 1/10 R shoulder pain at most for the past 7 days. No pain with reaching behind back with improving R shoulder IR strength and decreasing posterior shoulder muscle tension and improving posterior glide of humeral head. Pt tolerated session well without aggravation of symptoms. Pt will  benefit from continued skilled physical therapy services to continue to decrease pain, improve strength, ability to sleep on his R side, and ability to use his R UE to perform functional tasks more comfortably.     PT Short Term Goals - 03/12/20 1313      PT SHORT TERM GOAL #1   Title  Patient will be independent with his HEP to decrase pain, improve ability to reach and sleep on his R side more comfortably.    Baseline  Patient has started his HEP (02/06/2020); Pt performing his HEP (03/11/20)    Time  3    Period  Weeks    Status  Achieved    Target Date  02/29/20        PT Long Term Goals - 03/20/20 1446      PT LONG TERM GOAL #1   Title  Patient will have a decrease in R shoulder pain to 1/10 or less at worst to promote ability to reach as well as sleep on his R side more comfortably.    Baseline  3/10 R shoulder pain at worst for the past 3 months (02/06/2020); 1/10 (03/11/2020); 1/10 (03/20/2020)    Time  6    Period  Weeks    Status  Achieved    Target Date  03/21/20      PT LONG TERM GOAL #2   Title  Patient will improve R shoulder ER strength by 1/2 MMT grade to promote ability to raise his arm up with less pain.    Baseline  4/5 ER strength (02/16/2020); 5/5 ER (03/11/2020)    Time  6    Period  Weeks    Status  Achieved      PT LONG TERM GOAL #3   Title  Pt will improve his neck FOTO score by at least 10 points as a demonstration of improved function.    Baseline  Neck FOTO: 66 (02/06/2020), (03/11/20)    Time  6    Period  Weeks    Status  On-going    Target Date  04/11/20      PT LONG TERM GOAL #4   Title  Pt will report being able to sleep on his R side comfortably to promote ability to rest.    Baseline  Increased R shoulder pain when lying on his R side in bed after 7 minutes (03/20/2020)    Time  3    Period  Weeks    Status  New    Target Date  04/11/20  Plan - 03/20/20 1403    Clinical Impression Statement  Pt progressing very well with  decreased R shoulder pain with pt able to perform R shoulder flexion pain free and R shoulder abduction with only 0.5/10 pain at end range today. Pt also reports 1/10 R shoulder pain at most for the past 7 days. No pain with reaching behind back with improving R shoulder IR strength and decreasing posterior shoulder muscle tension and improving posterior glide of humeral head. Pt tolerated session well without aggravation of symptoms. Pt will benefit from continued skilled physical therapy services to continue to decrease pain, improve strength, ability to sleep on his R side, and ability to use his R UE to perform functional tasks more comfortably.    Personal Factors and Comorbidities  Age;Fitness;Past/Current Experience;Time since onset of injury/illness/exacerbation    Examination-Activity Limitations  Bed Mobility;Sleep;Lift;Reach Overhead    Stability/Clinical Decision Making  Stable/Uncomplicated    Clinical Decision Making  Low    Rehab Potential  Fair    PT Frequency  2x / week    PT Duration  3 weeks    PT Treatment/Interventions  Therapeutic activities;Therapeutic exercise;Neuromuscular re-education;Patient/family education;Manual techniques;Dry needling;Joint Manipulations;Spinal Manipulations;Aquatic Therapy;Electrical Stimulation;Iontophoresis 4mg /ml Dexamethasone;Traction   traction and manipulation if appropriate   PT Next Visit Plan  scapular and ER muscle strengthening, thoracic extension, manual techniques, modalities PRN    Consulted and Agree with Plan of Care  Patient       Patient will benefit from skilled therapeutic intervention in order to improve the following deficits and impairments:  Pain, Postural dysfunction, Improper body mechanics, Decreased strength  Visit Diagnosis: Chronic right shoulder pain - Plan: PT plan of care cert/re-cert  Muscle weakness (generalized) - Plan: PT plan of care cert/re-cert     Problem List Patient Active Problem List   Diagnosis  Date Noted  . COVID-19   . Hypoxia   . Pneumonia due to COVID-19 virus 11/16/2019  . Hypokalemia 11/16/2019  . Transaminitis 11/16/2019  . Seasonal and perennial allergic rhinitis 07/14/2017  . Adverse food reaction 01/18/2017  . Allergic rhinitis due to pollen 02/24/2011  . Obstructive sleep apnea 08/30/2009  . RHINOSINUSITIS, ACUTE 01/03/2009  . HYPERLIPIDEMIA 06/28/2008  . Essential hypertension 06/28/2008  . Chronic nonseasonal allergic rhinitis due to fungal spores 10/12/2007    10/14/2007 PT, DPT   03/20/2020, 2:51 PM  [place] [facility] SPORTS MEDICINE [address], [address], [place] Phone: [phone]   Fax:  [phone]  Name: Grayer Sproles MRN: Thamas Jaegers Date of Birth: 16-Jul-1956

## 2020-03-26 ENCOUNTER — Other Ambulatory Visit: Payer: Self-pay

## 2020-03-26 ENCOUNTER — Ambulatory Visit: Payer: Managed Care, Other (non HMO) | Attending: Diagnostic Neuroimaging

## 2020-03-26 DIAGNOSIS — M6281 Muscle weakness (generalized): Secondary | ICD-10-CM | POA: Insufficient documentation

## 2020-03-26 DIAGNOSIS — G8929 Other chronic pain: Secondary | ICD-10-CM

## 2020-03-26 DIAGNOSIS — M25511 Pain in right shoulder: Secondary | ICD-10-CM | POA: Diagnosis present

## 2020-03-26 NOTE — Therapy (Signed)
[place] [facility] [address], [place], [address] Phone: [phone]   Fax:  [phone]  Physical Therapy Treatment  Patient Details  Name: Ralph Ware MRN: [id number] Date of Birth: 1956/08/29 Referring Provider (PT): Joycelyn Schmid, MD   Encounter Date: 03/26/2020  PT End of Session - 03/26/20 1434    Visit Number  13    Number of Visits  19    Date for PT Re-Evaluation  04/11/20    Authorization Type  3    Authorization Time Period  of 10 progress report    PT Start Time  1433    PT Stop Time  1515    PT Time Calculation (min)  42 min    Activity Tolerance  Patient tolerated treatment well    Behavior During Therapy  [facility] for tasks assessed/performed       Past Medical History:  Diagnosis Date  . Allergic rhinitis   . Hyperlipidemia   . Hypertension   . OSA (obstructive sleep apnea)     Past Surgical History:  Procedure Laterality Date  . NASAL SEPTUM SURGERY     x2    There were no vitals filed for this visit.  Subjective Assessment - 03/26/20 1433    Subjective  Patient reported that overall he is doing well. Travelled this weekend no increased pain, but stated Sunday he did notice numbness/tingling in thumb, pointer, and middle finger, which hadn't happend in a while. Resolved within 2 minutes with change of position.    Pertinent History  Neck pain: R upper trap and proximal lateral arm pain. Pain started suddenly after Fathers day 2019. Pt reached towards his alarm clock and pain shot to his arm. Pain has improved but has not gone away. Pt was doing yardwork August 2020 and felt pain again B shoulders all the way down to his index and middle fingers bilaterally while performing yardwork. In 2019, pain was in the right side and could not botton his shirt. Had x-rays for neck and shoulders which showed a little bit or arthritis. Pt is R hand dominant. Has not yet had PT for current condition.    Patient Stated Goals  Make the pain go away.    Currently in Pain?  Yes    Pain Score  1     Pain Location  Shoulder    Pain Orientation  Right    Pain Descriptors / Indicators  Constant;Other (Comment)   Pt reported most pain occurs sleeping on R shoulder   Pain Type  Chronic pain    Pain Onset  More than a month ago       Medbridige Access Code: [email or web address]   Therapeutic exercise   talked over sleeping positions, shoulder anatomy  Standing R elbow extension with towel behind elbow 2x10x5 second holds. Cued to stay in painfree push      R shoulder IR/ER isometrics 5 second holds 2x10   Shoulder retraction with GTB 3x10 Advanced retraction x20  Improved exercise technique, movement at target joints, use of target muscles after min to mod verbal, visual, tactile cues.       Clinical impression/response: Pt initially reported pain with IR isometric that was decreased with second set of 10, increased pain felt with shoulder extension this session, but second set improved with decreased pain.  Cued for submax isometric contractions. PT and pt spent time discussing shoulder anatomy, condition, as well as optimizing sleep positions for comfort at home.  The patient would benefit from further skilled PT intervention to continue to progress towards goals.       PT Education - 03/26/20 1433    Education Details  therex    Person(s) Educated  Patient    Methods  Explanation;Demonstration;Tactile cues;Verbal cues    Comprehension  Returned demonstration;Verbalized understanding       PT Short Term Goals - 03/12/20 1313      PT SHORT TERM GOAL #1   Title  Patient will be independent with his HEP to decrase pain, improve ability to reach and sleep on his R side more comfortably.    Baseline  Patient has started his HEP (02/06/2020); Pt performing his HEP (03/11/20)    Time  3    Period  Weeks    Status  Achieved    Target Date  02/29/20        PT Long Term Goals - 03/20/20  1446      PT LONG TERM GOAL #1   Title  Patient will have a decrease in R shoulder pain to 1/10 or less at worst to promote ability to reach as well as sleep on his R side more comfortably.    Baseline  3/10 R shoulder pain at worst for the past 3 months (02/06/2020); 1/10 (03/11/2020); 1/10 (03/20/2020)    Time  6    Period  Weeks    Status  Achieved    Target Date  03/21/20      PT LONG TERM GOAL #2   Title  Patient will improve R shoulder ER strength by 1/2 MMT grade to promote ability to raise his arm up with less pain.    Baseline  4/5 ER strength (02/16/2020); 5/5 ER (03/11/2020)    Time  6    Period  Weeks    Status  Achieved      PT LONG TERM GOAL #3   Title  Pt will improve his neck FOTO score by at least 10 points as a demonstration of improved function.    Baseline  Neck FOTO: 66 (02/06/2020), (03/11/20)    Time  6    Period  Weeks    Status  On-going    Target Date  04/11/20      PT LONG TERM GOAL #4   Title  Pt will report being able to sleep on his R side comfortably to promote ability to rest.    Baseline  Increased R shoulder pain when lying on his R side in bed after 7 minutes (03/20/2020)    Time  3    Period  Weeks    Status  New    Target Date  04/11/20            Plan - 03/26/20 1434    Clinical Impression Statement  Pt initially reported pain with IR isometric that was decreased with second set of 10, increased pain felt with shoulder extension this session, but second set improved with decreased pain.  Cued for submax isometric contractions. PT and pt spent time discussing shoulder anatomy, condition, as well as optimizing sleep positions for comfort at home. The patient would benefit from further skilled PT intervention to continue to progress towards goals.    Personal Factors and Comorbidities  Age;Fitness;Past/Current Experience;Time since onset of injury/illness/exacerbation    Examination-Activity Limitations  Bed Mobility;Sleep;Lift;Reach Overhead     Stability/Clinical Decision Making  Stable/Uncomplicated    Rehab Potential  Fair    PT Frequency  2x /  week    PT Duration  3 weeks    PT Treatment/Interventions  Therapeutic activities;Therapeutic exercise;Neuromuscular re-education;Patient/family education;Manual techniques;Dry needling;Joint Manipulations;Spinal Manipulations;Aquatic Therapy;Electrical Stimulation;Iontophoresis 4mg /ml Dexamethasone;Traction    PT Next Visit Plan  scapular and ER muscle strengthening, thoracic extension, manual techniques, modalities PRN    Consulted and Agree with Plan of Care  Patient       Patient will benefit from skilled therapeutic intervention in order to improve the following deficits and impairments:  Pain, Postural dysfunction, Improper body mechanics, Decreased strength  Visit Diagnosis: Chronic right shoulder pain  Muscle weakness (generalized)     Problem List Patient Active Problem List   Diagnosis Date Noted  . COVID-19   . Hypoxia   . Pneumonia due to COVID-19 virus 11/16/2019  . Hypokalemia 11/16/2019  . Transaminitis 11/16/2019  . Seasonal and perennial allergic rhinitis 07/14/2017  . Adverse food reaction 01/18/2017  . Allergic rhinitis due to pollen 02/24/2011  . Obstructive sleep apnea 08/30/2009  . RHINOSINUSITIS, ACUTE 01/03/2009  . HYPERLIPIDEMIA 06/28/2008  . Essential hypertension 06/28/2008  . Chronic nonseasonal allergic rhinitis due to fungal spores 10/12/2007    10/14/2007 PT, DPT 3:16 PM,03/26/20   [facility] [facility] PHYSICAL AND SPORTS MEDICINE [address], [place] Phone: [phone]   Fax:  [phone]  Name: Ralph Ware MRN: Thamas Jaegers Date of Birth: 01-27-1956

## 2020-04-01 ENCOUNTER — Ambulatory Visit: Payer: Managed Care, Other (non HMO)

## 2020-04-01 ENCOUNTER — Other Ambulatory Visit: Payer: Self-pay

## 2020-04-01 DIAGNOSIS — G8929 Other chronic pain: Secondary | ICD-10-CM

## 2020-04-01 DIAGNOSIS — M25511 Pain in right shoulder: Secondary | ICD-10-CM

## 2020-04-01 NOTE — Therapy (Signed)
[place] [facility] SPORTS MEDIC[facility] [address], [place], [address] Phone: [phone]   Fax:  [phone]  Physical Therapy Treatment  Patient Details  Name: Ralph Ware MRN: [id number] Date of Birth: 11-26-1956 Referring Provider (PT): Andrey Spearman, MD   Encounter Date: 04/01/2020  PT End of Session - 04/01/20 1405    Visit Number  14    Number of Visits  19    Date for PT Re-Evaluation  04/11/20    Authorization Type  4    Authorization Time Period  of 10 progress report    PT Start Time  1405    PT Stop Time  1450    PT Time Calculation (min)  45 min    Activity Tolerance  Patient tolerated treatment well    Behavior During Therapy  [facility] for tasks assessed/performed       Past Medical History:  Diagnosis Date  . Allergic rhinitis   . Hyperlipidemia   . Hypertension   . OSA (obstructive sleep apnea)     Past Surgical History:  Procedure Laterality Date  . NASAL SEPTUM SURGERY     x2    There were no vitals filed for this visit.  Subjective Assessment - 04/01/20 1406    Subjective  R shouldre is about the same. 1/10 at end range (0/10 at rest). Has not had the R 1st 3 digit numbness since it last happened.    Pertinent History  Neck pain: R upper trap and proximal lateral arm pain. Pain started suddenly after Fathers day 2019. Pt reached towards his alarm clock and pain shot to his arm. Pain has improved but has not gone away. Pt was doing yardwork August 2020 and felt pain again B shoulders all the way down to his index and middle fingers bilaterally while performing yardwork. In 2019, pain was in the right side and could not botton his shirt. Had x-rays for neck and shoulders which showed a little bit or arthritis. Pt is R hand dominant. Has not yet had PT for current condition.    Patient Stated Goals  Make the pain go away.    Currently in Pain?  Yes    Pain Score  1    end range   Pain Onset  More than a  month ago                               PT Education - 04/01/20 1734    Education Details  manual therapy    Person(s) Educated  Patient    Methods  Explanation    Comprehension  Verbalized understanding       Objective    Blood pressure controlled per pt.  No latex band allergies  MedbridigeAccess Code: [email or web address]  Manual therapy  Caudal glide R distal clavicle grade 3  Decreased end range shoulder flexion and abduction pain  seated STM R anterior shoulder to decrease fascial tightness  Seated STM R superior shoulder to decrease fascial tightness  dereased pain with abduction   Seated STM R rhomboid to decrease fascial tension   Decreased R shoulder pain with flexion   Seated STM R rhomboid muscle to decrease muscle tension   Slight increased in R posterior lateral shoulder discomfort with STM to R rhomboid mucles        Response to treatment Fair tolerance to today's session  Clinical impression Decreased pain with abduction  with treatment to decrease fascial tension R superior and anterior shoulder area as well as with caudal glide to distal clavicle. Increased symptoms with STM to R rhomboid muscles.  Pt will benefit from continued skilled physical therapy services to decrease pain, improve strength, ability to raise his R arm up comfortable, and improve function.     PT Short Term Goals - 03/12/20 1313      PT SHORT TERM GOAL #1   Title  Patient will be independent with his HEP to decrase pain, improve ability to reach and sleep on his R side more comfortably.    Baseline  Patient has started his HEP (02/06/2020); Pt performing his HEP (03/11/20)    Time  3    Period  Weeks    Status  Achieved    Target Date  02/29/20        PT Long Term Goals - 03/20/20 1446      PT LONG TERM GOAL #1   Title  Patient will have a decrease in R shoulder pain to 1/10 or less at worst to promote ability to reach as well as sleep on  his R side more comfortably.    Baseline  3/10 R shoulder pain at worst for the past 3 months (02/06/2020); 1/10 (03/11/2020); 1/10 (03/20/2020)    Time  6    Period  Weeks    Status  Achieved    Target Date  03/21/20      PT LONG TERM GOAL #2   Title  Patient will improve R shoulder ER strength by 1/2 MMT grade to promote ability to raise his arm up with less pain.    Baseline  4/5 ER strength (02/16/2020); 5/5 ER (03/11/2020)    Time  6    Period  Weeks    Status  Achieved      PT LONG TERM GOAL #3   Title  Pt will improve his neck FOTO score by at least 10 points as a demonstration of improved function.    Baseline  Neck FOTO: 66 (02/06/2020), (03/11/20)    Time  6    Period  Weeks    Status  On-going    Target Date  04/11/20      PT LONG TERM GOAL #4   Title  Pt will report being able to sleep on his R side comfortably to promote ability to rest.    Baseline  Increased R shoulder pain when lying on his R side in bed after 7 minutes (03/20/2020)    Time  3    Period  Weeks    Status  New    Target Date  04/11/20            Plan - 04/01/20 1734    Clinical Impression Statement  Decreased pain with abduction with treatment to decrease fascial tension R superior and anterior shoulder area as well as with caudal glide to distal clavicle. Increased symptoms with STM to R rhomboid muscles.  Pt will benefit from continued skilled physical therapy services to decrease pain, improve strength, ability to raise his R arm up comfortable, and improve function.    Personal Factors and Comorbidities  Age;Fitness;Past/Current Experience;Time since onset of injury/illness/exacerbation    Examination-Activity Limitations  Bed Mobility;Sleep;Lift;Reach Overhead    Stability/Clinical Decision Making  Stable/Uncomplicated    Rehab Potential  Fair    PT Frequency  2x / week    PT Duration  3 weeks    PT Treatment/Interventions  Therapeutic activities;Therapeutic exercise;Neuromuscular  re-education;Patient/family education;Manual techniques;Dry needling;Joint Manipulations;Spinal Manipulations;Aquatic Therapy;Electrical Stimulation;Iontophoresis 4mg /ml Dexamethasone;Traction    PT Next Visit Plan  scapular and ER muscle strengthening, thoracic extension, manual techniques, modalities PRN    Consulted and Agree with Plan of Care  Patient       Patient will benefit from skilled therapeutic intervention in order to improve the following deficits and impairments:  Pain, Postural dysfunction, Improper body mechanics, Decreased strength  Visit Diagnosis: Chronic right shoulder pain     Problem List Patient Active Problem List   Diagnosis Date Noted  . COVID-19   . Hypoxia   . Pneumonia due to COVID-19 virus 11/16/2019  . Hypokalemia 11/16/2019  . Transaminitis 11/16/2019  . Seasonal and perennial allergic rhinitis 07/14/2017  . Adverse food reaction 01/18/2017  . Allergic rhinitis due to pollen 02/24/2011  . Obstructive sleep apnea 08/30/2009  . RHINOSINUSITIS, ACUTE 01/03/2009  . HYPERLIPIDEMIA 06/28/2008  . Essential hypertension 06/28/2008  . Chronic nonseasonal allergic rhinitis due to fungal spores 10/12/2007    Ralph Ware 04/01/2020, 6:34 PM  [place] [facility] [address], [place] Phone: [phone]   Fax:  [phone]  Name: Ralph Ware MRN: Thamas Jaegers Date of Birth: [birth year]

## 2020-04-03 ENCOUNTER — Ambulatory Visit: Payer: Managed Care, Other (non HMO)

## 2020-04-03 ENCOUNTER — Other Ambulatory Visit: Payer: Self-pay

## 2020-04-03 DIAGNOSIS — M6281 Muscle weakness (generalized): Secondary | ICD-10-CM

## 2020-04-03 DIAGNOSIS — G8929 Other chronic pain: Secondary | ICD-10-CM

## 2020-04-03 DIAGNOSIS — M25511 Pain in right shoulder: Secondary | ICD-10-CM | POA: Diagnosis not present

## 2020-04-03 NOTE — Therapy (Signed)
[facility] [address] [place], [address] Phone: [phone]   Fax:  [phone]  Physical Therapy Treatment  Patient Details  Name: Ralph Ware MRN: [id number] Date of Birth: Sep 08, 1956 Referring Provider (PT): Joycelyn Schmid, MD   Encounter Date: 04/03/2020  PT End of Session - 04/03/20 1351    Visit Number  15    Number of Visits  19    Date for PT Re-Evaluation  04/11/20    Authorization Type  5    Authorization Time Period  of 10 progress report    PT Start Time  1351    PT Stop Time  1434    PT Time Calculation (min)  43 min    Activity Tolerance  Patient tolerated treatment well    Behavior During Therapy  [facility] for tasks assessed/performed       Past Medical History:  Diagnosis Date  . Allergic rhinitis   . Hyperlipidemia   . Hypertension   . OSA (obstructive sleep apnea)     Past Surgical History:  Procedure Laterality Date  . NASAL SEPTUM SURGERY     x2    There were no vitals filed for this visit.  Subjective Assessment - 04/03/20 1352    Subjective  R shoulder is ok. Gave him some pain earlier today. Routine shower motions such as reaching, and drying himself off would cause pain. End range symptoms, about 1/10. R shoulder was ok after last session.    Pertinent History  Neck pain: R upper trap and proximal lateral arm pain. Pain started suddenly after Fathers day 2019. Pt reached towards his alarm clock and pain shot to his arm. Pain has improved but has not gone away. Pt was doing yardwork August 2020 and felt pain again B shoulders all the way down to his index and middle fingers bilaterally while performing yardwork. In 2019, pain was in the right side and could not botton his shirt. Had x-rays for neck and shoulders which showed a little bit or arthritis. Pt is R hand dominant. Has not yet had PT for current condition.    Patient Stated Goals  Make the pain go away.     Currently in Pain?  Yes    Pain Score  1     Pain Onset  More than a month ago                               PT Education - 04/03/20 1414    Education Details  ther-ex    [employer]) Educated  Patient    Methods  Explanation;Demonstration;Tactile cues;Verbal cues    Comprehension  Returned demonstration;Verbalized understanding       Objective    Blood pressure controlled per pt.  No latex band allergies  MedbridigeAccess Code: [email or web address]    Manual therapy  Seated STM R superior and anterior shoulder to decrease fascial tension  Decreased pain with shoulder flexion and abduction   Therapeutic exercise  Seated manually resisted R shoulder extension and elbow flexion (at 90 degree elbow flexion position) isometrics 10x5 seconds for 3 sets  Seated manually resisted R shoulder ER isometrics 10x5 seconds for 3 sets   Minimal to no pain with shoulder abduction and flexion afterwards   standing R shoulder IR blue band 10x3   Standing doorway pectoralis stretch 30 seconds x 5  No improvement in R shoulder abduction afterwards.  Improved exercise technique, movement at target joints, use of target muscles aftermin tomod verbal, visual, tactile cues.     Response to treatment Pt tolerated session well without aggravation of symptoms.   Clinical impression Decreased R shoulder pain with flexion and abduction with treatment to decrease fascial restrictions to R superior and anterior shoulder as well as promoting posterior glide to R humeral head. Still demonstrates difficulty reaching behind his back. Pt will benefit from continued skilled physical therapy services to decrease pain improve strength and function, and decrease difficulty reaching with his R arm.     PT Short Term Goals - 03/12/20 1313      PT SHORT TERM GOAL #1   Title  Patient will be independent with his HEP to decrase pain, improve ability to reach and sleep on  his R side more comfortably.    Baseline  Patient has started his HEP (02/06/2020); Pt performing his HEP (03/11/20)    Time  3    Period  Weeks    Status  Achieved    Target Date  02/29/20        PT Long Term Goals - 03/20/20 1446      PT LONG TERM GOAL #1   Title  Patient will have a decrease in R shoulder pain to 1/10 or less at worst to promote ability to reach as well as sleep on his R side more comfortably.    Baseline  3/10 R shoulder pain at worst for the past 3 months (02/06/2020); 1/10 (03/11/2020); 1/10 (03/20/2020)    Time  6    Period  Weeks    Status  Achieved    Target Date  03/21/20      PT LONG TERM GOAL #2   Title  Patient will improve R shoulder ER strength by 1/2 MMT grade to promote ability to raise his arm up with less pain.    Baseline  4/5 ER strength (02/16/2020); 5/5 ER (03/11/2020)    Time  6    Period  Weeks    Status  Achieved      PT LONG TERM GOAL #3   Title  Pt will improve his neck FOTO score by at least 10 points as a demonstration of improved function.    Baseline  Neck FOTO: 66 (02/06/2020), (03/11/20)    Time  6    Period  Weeks    Status  On-going    Target Date  04/11/20      PT LONG TERM GOAL #4   Title  Pt will report being able to sleep on his R side comfortably to promote ability to rest.    Baseline  Increased R shoulder pain when lying on his R side in bed after 7 minutes (03/20/2020)    Time  3    Period  Weeks    Status  New    Target Date  04/11/20            Plan - 04/03/20 1443    Clinical Impression Statement  Decreased R shoulder pain with flexion and abduction with treatment to decrease fascial restrictions to R superior and anterior shoulder as well as promoting posterior glide to R humeral head. Still demonstrates difficulty reaching behind his back. Pt will benefit from continued skilled physical therapy services to decrease pain improve strength and function, and decrease difficulty reaching with his R arm.    Personal  Factors and Comorbidities  Age;Fitness;Past/Current Experience;Time since onset of injury/illness/exacerbation  Examination-Activity Limitations  Bed Mobility;Sleep;Lift;Reach Overhead    Stability/Clinical Decision Making  Stable/Uncomplicated    Rehab Potential  Fair    PT Frequency  2x / week    PT Duration  3 weeks    PT Treatment/Interventions  Therapeutic activities;Therapeutic exercise;Neuromuscular re-education;Patient/family education;Manual techniques;Dry needling;Joint Manipulations;Spinal Manipulations;Aquatic Therapy;Electrical Stimulation;Iontophoresis 4mg /ml Dexamethasone;Traction    PT Next Visit Plan  scapular and ER muscle strengthening, thoracic extension, manual techniques, modalities PRN    Consulted and Agree with Plan of Care  Patient       Patient will benefit from skilled therapeutic intervention in order to improve the following deficits and impairments:  Pain, Postural dysfunction, Improper body mechanics, Decreased strength  Visit Diagnosis: Chronic right shoulder pain  Muscle weakness (generalized)     Problem List Patient Active Problem List   Diagnosis Date Noted  . COVID-19   . Hypoxia   . Pneumonia due to COVID-19 virus 11/16/2019  . Hypokalemia 11/16/2019  . Transaminitis 11/16/2019  . Seasonal and perennial allergic rhinitis 07/14/2017  . Adverse food reaction 01/18/2017  . Allergic rhinitis due to pollen 02/24/2011  . Obstructive sleep apnea 08/30/2009  . RHINOSINUSITIS, ACUTE 01/03/2009  . HYPERLIPIDEMIA 06/28/2008  . Essential hypertension 06/28/2008  . Chronic nonseasonal allergic rhinitis due to fungal spores 10/12/2007    Joneen Boers PT, DPT   04/03/2020, 2:54 PM  [place] [facility] [address] [place], [address] Phone: [phone]   Fax:  [phone]  Name: Elery Cadenhead MRN: [id number] Date of Birth: [birth year]

## 2020-04-08 ENCOUNTER — Ambulatory Visit: Payer: Managed Care, Other (non HMO)

## 2020-04-08 ENCOUNTER — Other Ambulatory Visit: Payer: Self-pay

## 2020-04-08 DIAGNOSIS — M25511 Pain in right shoulder: Secondary | ICD-10-CM | POA: Diagnosis not present

## 2020-04-08 DIAGNOSIS — G8929 Other chronic pain: Secondary | ICD-10-CM

## 2020-04-08 DIAGNOSIS — M6281 Muscle weakness (generalized): Secondary | ICD-10-CM

## 2020-04-08 NOTE — Therapy (Signed)
[facility] [address] [place], [address] Phone: [phone]   Fax:  [phone]  Physical Therapy Treatment  Patient Details  Name: Ralph Ware MRN: [id number] Date of Birth: 1956/11/19 Referring Provider (PT): Joycelyn Schmid, MD   Encounter Date: 04/08/2020  PT End of Session - 04/08/20 1532    Visit Number  16    Number of Visits  19    Date for PT Re-Evaluation  04/11/20    Authorization Type  6    Authorization Time Period  of 10 progress report    PT Start Time  1532    PT Stop Time  1605    PT Time Calculation (min)  33 min    Activity Tolerance  Patient tolerated treatment well    Behavior During Therapy  [facility] for tasks assessed/performed       Past Medical History:  Diagnosis Date  . Allergic rhinitis   . Hyperlipidemia   . Hypertension   . OSA (obstructive sleep apnea)     Past Surgical History:  Procedure Laterality Date  . NASAL SEPTUM SURGERY     x2    There were no vitals filed for this visit.  Subjective Assessment - 04/08/20 1533    Subjective  R shoulder is pretty good today. 1/10 end range pressure wiht flexion and abduction.    Pertinent History  Neck pain: R upper trap and proximal lateral arm pain. Pain started suddenly after Fathers day 2019. Pt reached towards his alarm clock and pain shot to his arm. Pain has improved but has not gone away. Pt was doing yardwork August 2020 and felt pain again B shoulders all the way down to his index and middle fingers bilaterally while performing yardwork. In 2019, pain was in the right side and could not botton his shirt. Had x-rays for neck and shoulders which showed a little bit or arthritis. Pt is R hand dominant. Has not yet had PT for current condition.    Patient Stated Goals  Make the pain go away.    Currently in Pain?  Yes    Pain Score  1     Pain Onset  More than a month ago                                PT Education - 04/08/20 1619    Education Details  ther-ex, HEP, blue band provided    Person(s) Educated  Patient    Methods  Explanation;Demonstration;Tactile cues;Verbal cues;Handout    Comprehension  Verbalized understanding;Returned demonstration      Objective    Blood pressure controlled per pt.  No latex band allergies  MedbridigeAccess Code: [email or web address]    Therapeutic exercise  Standing shoulder extension green band  R 10x, then 10x2 with 5 second holds  standing R triceps extension green band 10x3 with 5 second holds  Standing R shoulder horizontal abduction red band 10x3  Standing R rhomboid stretch at doorway 15 seconds x 5  Standing R shoulder IR blue band with eccentric return 10x  Then double blue band 10x3  decereased pain with shouldre flexion, abduciton and functional IR     Improved exercise technique, movement at target joints, use of target muscles aftermin tomod verbal, visual, tactile cues.     Response to treatment Pt tolerated session well without aggravation of symptoms.   Clinical impression  Decreased R  anterior and lateral shoulder pain with flexion and abduction as well as functional IR with treatment to promote posterior glide of R humeral head through the use of his muscles, especially after R shoulder IR exercise. Pt tolerated session very well without aggravation of symptoms. Pt will benefit from continued skilled physical therapy services to decrease pain, improve ROM, strength, and function.     PT Short Term Goals - 03/12/20 1313      PT SHORT TERM GOAL #1   Title  Patient will be independent with his HEP to decrase pain, improve ability to reach and sleep on his R side more comfortably.    Baseline  Patient has started his HEP (02/06/2020); Pt performing his HEP (03/11/20)    Time  3    Period  Weeks    Status  Achieved    Target Date  02/29/20         PT Long Term Goals - 03/20/20 1446      PT LONG TERM GOAL #1   Title  Patient will have a decrease in R shoulder pain to 1/10 or less at worst to promote ability to reach as well as sleep on his R side more comfortably.    Baseline  3/10 R shoulder pain at worst for the past 3 months (02/06/2020); 1/10 (03/11/2020); 1/10 (03/20/2020)    Time  6    Period  Weeks    Status  Achieved    Target Date  03/21/20      PT LONG TERM GOAL #2   Title  Patient will improve R shoulder ER strength by 1/2 MMT grade to promote ability to raise his arm up with less pain.    Baseline  4/5 ER strength (02/16/2020); 5/5 ER (03/11/2020)    Time  6    Period  Weeks    Status  Achieved      PT LONG TERM GOAL #3   Title  Pt will improve his neck FOTO score by at least 10 points as a demonstration of improved function.    Baseline  Neck FOTO: 66 (02/06/2020), (03/11/20)    Time  6    Period  Weeks    Status  On-going    Target Date  04/11/20      PT LONG TERM GOAL #4   Title  Pt will report being able to sleep on his R side comfortably to promote ability to rest.    Baseline  Increased R shoulder pain when lying on his R side in bed after 7 minutes (03/20/2020)    Time  3    Period  Weeks    Status  New    Target Date  04/11/20            Plan - 04/08/20 1619    Clinical Impression Statement  Decreased R anterior and lateral shoulder pain with flexion and abduction as well as functional IR with treatment to promote posterior glide of R humeral head through the use of his muscles, especially after R shoulder IR exercise. Pt tolerated session very well without aggravation of symptoms. Pt will benefit from continued skilled physical therapy services to decrease pain, improve ROM, strength, and function.    Personal Factors and Comorbidities  Age;Fitness;Past/Current Experience;Time since onset of injury/illness/exacerbation    Examination-Activity Limitations  Bed Mobility;Sleep;Lift;Reach Overhead     Stability/Clinical Decision Making  Stable/Uncomplicated    Rehab Potential  Fair    PT Frequency  2x / week  PT Duration  3 weeks    PT Treatment/Interventions  Therapeutic activities;Therapeutic exercise;Neuromuscular re-education;Patient/family education;Manual techniques;Dry needling;Joint Manipulations;Spinal Manipulations;Aquatic Therapy;Electrical Stimulation;Iontophoresis 4mg /ml Dexamethasone;Traction    PT Next Visit Plan  scapular and ER muscle strengthening, thoracic extension, manual techniques, modalities PRN    Consulted and Agree with Plan of Care  Patient       Patient will benefit from skilled therapeutic intervention in order to improve the following deficits and impairments:  Pain, Postural dysfunction, Improper body mechanics, Decreased strength  Visit Diagnosis: Chronic right shoulder pain  Muscle weakness (generalized)     Problem List Patient Active Problem List   Diagnosis Date Noted  . COVID-19   . Hypoxia   . Pneumonia due to COVID-19 virus 11/16/2019  . Hypokalemia 11/16/2019  . Transaminitis 11/16/2019  . Seasonal and perennial allergic rhinitis 07/14/2017  . Adverse food reaction 01/18/2017  . Allergic rhinitis due to pollen 02/24/2011  . Obstructive sleep apnea 08/30/2009  . RHINOSINUSITIS, ACUTE 01/03/2009  . HYPERLIPIDEMIA 06/28/2008  . Essential hypertension 06/28/2008  . Chronic nonseasonal allergic rhinitis due to fungal spores 10/12/2007    Joneen Boers PT, DPT   04/08/2020, 4:21 PM  [facility] [address] [place], [address] Phone: [phone]   Fax:  [phone]  Name: Daishon Chui MRN: [id number] Date of Birth: Oct 17, 1956

## 2020-04-08 NOTE — Patient Instructions (Signed)
Access Code: Berenice Primas URL: [email or web address] Date: 04/08/2020 Prepared by: Loralyn Freshwater  Exercises Shoulder W - External Rotation with Resistance - 1 x daily - 7 x weekly - 10 reps - 3 sets Sleeper Stretch - 1 x daily - 7 x weekly - 10 reps - 3 sets - 5 seconds hold Standing Shoulder Posterior Capsule Stretch - 1 x daily - 7 x weekly - 5 reps - 2 sets - 15 seconds hold Single Arm Doorway Pec Stretch at 90 Degrees Abduction - 3 x daily - 7 x weekly - 3 reps - 1 sets - 30 seconds hold Supine PNF D2 Flexion with Resistance - 1 x daily - 7 x weekly - 10 reps - 3 sets Standing Bilateral Elbow Extension with Anchored Resistance - 1 x daily - 7 x weekly - 3 sets - 10 reps - 5-10 seconds hold First Rib Mobilization with Strap - 3 x daily - 7 x weekly - 1 sets - 5 reps - 15 seconds hold Seated Isometric Elbow Extension - 1 x daily - 7 x weekly - 3 sets - 10 reps - 5 seconds hold Standing Shoulder Internal Rotation with Anchored Resistance - 1 x daily - 7 x weekly - 2 sets - 10 reps

## 2020-04-11 ENCOUNTER — Ambulatory Visit: Payer: Managed Care, Other (non HMO)

## 2020-04-11 ENCOUNTER — Other Ambulatory Visit: Payer: Self-pay

## 2020-04-11 DIAGNOSIS — G8929 Other chronic pain: Secondary | ICD-10-CM

## 2020-04-11 DIAGNOSIS — M25511 Pain in right shoulder: Secondary | ICD-10-CM | POA: Diagnosis not present

## 2020-04-11 DIAGNOSIS — M6281 Muscle weakness (generalized): Secondary | ICD-10-CM

## 2020-04-11 NOTE — Therapy (Signed)
[facility] [address] [place], [address] Phone: [phone]   Fax:  [phone]  Physical Therapy Treatment And Discharge Summary  Patient Details  Name: Ralph Ware MRN: [id number] Date of Birth: 1956-06-28 Referring Provider (PT): Andrey Spearman, MD   Encounter Date: 04/11/2020  PT End of Session - 04/11/20 1703    Visit Number  17    Number of Visits  19    Date for PT Re-Evaluation  04/11/20    Authorization Type  7    Authorization Time Period  of 10 progress report    PT Start Time  1705    PT Stop Time  1749    PT Time Calculation (min)  44 min    Activity Tolerance  Patient tolerated treatment well    Behavior During Therapy  [facility] for tasks assessed/performed       Past Medical History:  Diagnosis Date  . Allergic rhinitis   . Hyperlipidemia   . Hypertension   . OSA (obstructive sleep apnea)     Past Surgical History:  Procedure Laterality Date  . NASAL SEPTUM SURGERY     x2    There were no vitals filed for this visit.  Subjective Assessment - 04/11/20 1705    Subjective  R shoulder still has pain at the top with abduction and flexion, 1/10. 1/10 at most for the past 7 days. Feels like he is reinjuring it every time he does something.  Feels like he can graduate PT today and wants to try doing his exercises at home.    Pertinent History  Neck pain: R upper trap and proximal lateral arm pain. Pain started suddenly after Fathers day 2019. Pt reached towards his alarm clock and pain shot to his arm. Pain has improved but has not gone away. Pt was doing yardwork August 2020 and felt pain again B shoulders all the way down to his index and middle fingers bilaterally while performing yardwork. In 2019, pain was in the right side and could not botton his shirt. Had x-rays for neck and shoulders which showed a little bit or arthritis. Pt is R hand dominant. Has not yet had PT for current  condition.    Patient Stated Goals  Make the pain go away.    Currently in Pain?  Yes    Pain Score  1     Pain Onset  More than a month ago                               PT Education - 04/11/20 1719    Education Details  ther-ex    Person(s) Educated  Patient    Methods  Explanation;Demonstration;Tactile cues;Verbal cues    Comprehension  Returned demonstration;Verbalized understanding       Objective    Blood pressure controlled per pt.  No latex band allergies  MedbridigeAccess Code: [email or web address]    Therapeutic exercise Seated manually resisted scapular retraction targeting the lower trap muscles  R 10x3 with 5 second holds  Still has end range pain  R first rib stretch with strap 15 seconds x 5  Decreased end range pain   Then R first rib stretch with strap at distal clavicle 15 seconds x 5   Standing R shoulder IR double blue band with eccentric return 10x3         decreased pain with AROM  R  shoulder AROM  Flexion: 165 degrees, no pain (L 162 degrees)   Abduction: 167 degrees, pain at end range   Seated R self inferior glide R shoulder joint 5 seconds x 2. Discomfort.   Seated manually resisted R shoulder extension and elbow extension 10x5 seconds for 3 sets  Decreased pain  R shoulder flexion, abduction, functional IR AROM throughout session to assess effectiveness of treatment.     Improved exercise technique, movement at target joints, use of target muscles aftermin tomod verbal, visual, tactile cues.     Response to treatment Pt tolerated session well without aggravation of symptoms.  Clinical impression Pt demonstrates overall decreased R shoulder pain, with worst pain level being 1/10 for the past 7 days, improved shoulder AROM as well as improved strength, ability to perform functional tasks with his R UE, and demonstrates no painful arc, just discomfort/pain at end range positions. Symptoms improve  with HEP, especially resisted IR as well as R first rib stretch. Still demonstrates pain with R S/L and with end range positions. Pt however has made very good progress with PT towards goals overall and demonstrates independence and consistency with his HEP. Skilled physical therapy services discharged with pt continuing progress with his exercises at home.     PT Short Term Goals - 03/12/20 1313      PT SHORT TERM GOAL #1   Title  Patient will be independent with his HEP to decrase pain, improve ability to reach and sleep on his R side more comfortably.    Baseline  Patient has started his HEP (02/06/2020); Pt performing his HEP (03/11/20)    Time  3    Period  Weeks    Status  Achieved    Target Date  02/29/20        PT Long Term Goals - 04/11/20 1857      PT LONG TERM GOAL #1   Title  Patient will have a decrease in R shoulder pain to 1/10 or less at worst to promote ability to reach as well as sleep on his R side more comfortably.    Baseline  3/10 R shoulder pain at worst for the past 3 months (02/06/2020); 1/10 (03/11/2020); 1/10 (03/20/2020), (04/11/2020)    Time  6    Period  Weeks    Status  Achieved    Target Date  04/11/20      PT LONG TERM GOAL #2   Title  Patient will improve R shoulder ER strength by 1/2 MMT grade to promote ability to raise his arm up with less pain.    Baseline  4/5 ER strength (02/16/2020); 5/5 ER (03/11/2020)    Time  6    Period  Weeks    Status  Achieved      PT LONG TERM GOAL #3   Title  Pt will improve his neck FOTO score by at least 10 points as a demonstration of improved function.    Baseline  Neck FOTO: 66 (02/06/2020), (03/11/20). Not applicable. Pain is from his R shoulder, not from his neck and this is a neck FOTO (04/11/2020)    Time  6    Period  Weeks    Status  Deferred    Target Date  04/11/20      PT LONG TERM GOAL #4   Title  Pt will report being able to sleep on his R side comfortably to promote ability to rest.    Baseline   Increased R shoulder pain   when lying on his R side in bed after 7 minutes (03/20/2020); Still has pain in R side lying (04/11/2020)    Time  3    Period  Weeks    Status  Not Met    Target Date  04/11/20            Plan - 04/11/20 1720    Clinical Impression Statement  Pt demonstrates overall decreased R shoulder pain, with worst pain level being 1/10 for the past 7 days, improved shoulder AROM as well as improved strength, ability to perform functional tasks with his R UE, and demonstrates no painful arc, just discomfort/pain at end range positions. Symptoms improve with HEP, especially resisted IR as well as R first rib stretch. Still demonstrates pain with R S/L and with end range positions. Pt however has made very good progress with PT towards goals overall and demonstrates independence and consistency with his HEP. Skilled physical therapy services discharged with pt continuing progress with his exercises at home.    Personal Factors and Comorbidities  Age;Fitness;Past/Current Experience;Time since onset of injury/illness/exacerbation    Examination-Activity Limitations  Bed Mobility;Sleep;Lift;Reach Overhead    Stability/Clinical Decision Making  --    Rehab Potential  --    PT Frequency  --    PT Duration  --    PT Treatment/Interventions  Therapeutic activities;Therapeutic exercise;Neuromuscular re-education;Patient/family education;Manual techniques    PT Next Visit Plan  Continue progress with his HEP    Consulted and Agree with Plan of Care  Patient       Patient will benefit from skilled therapeutic intervention in order to improve the following deficits and impairments:  Pain, Postural dysfunction, Improper body mechanics, Decreased strength  Visit Diagnosis: Chronic right shoulder pain  Muscle weakness (generalized)     Problem List Patient Active Problem List   Diagnosis Date Noted  . COVID-19   . Hypoxia   . Pneumonia due to COVID-19 virus 11/16/2019  .  Hypokalemia 11/16/2019  . Transaminitis 11/16/2019  . Seasonal and perennial allergic rhinitis 07/14/2017  . Adverse food reaction 01/18/2017  . Allergic rhinitis due to pollen 02/24/2011  . Obstructive sleep apnea 08/30/2009  . RHINOSINUSITIS, ACUTE 01/03/2009  . HYPERLIPIDEMIA 06/28/2008  . Essential hypertension 06/28/2008  . Chronic nonseasonal allergic rhinitis due to fungal spores 10/12/2007    Thank you for your referral.  Miguel Laygo PT, DPT   04/11/2020, 7:09 PM  [facility] [address] [place], [place], [address] Phone: [phone]   Fax:  [phone]  Name: Ralph Ware MRN: [id number] Date of Birth: 11/23/1956   

## 2020-04-11 NOTE — Patient Instructions (Signed)
Access Code: Berenice Primas URL: [email or web address] Date: 04/11/2020 Prepared by: Loralyn Freshwater  Exercises Shoulder W - External Rotation with Resistance - 1 x daily - 7 x weekly - 10 reps - 3 sets Sleeper Stretch - 1 x daily - 7 x weekly - 10 reps - 3 sets - 5 seconds hold Single Arm Doorway Pec Stretch at 90 Degrees Abduction - 3 x daily - 7 x weekly - 3 reps - 1 sets - 30 seconds hold Supine PNF D2 Flexion with Resistance - 1 x daily - 7 x weekly - 10 reps - 3 sets Standing Bilateral Elbow Extension with Anchored Resistance - 1 x daily - 7 x weekly - 3 sets - 10 reps - 5-10 seconds hold First Rib Mobilization with Strap - 3 x daily - 7 x weekly - 1 sets - 5 reps - 15 seconds hold Seated Isometric Elbow Extension - 1 x daily - 7 x weekly - 3 sets - 10 reps - 5 seconds hold Standing Shoulder Internal Rotation with Anchored Resistance - 1 x daily - 7 x weekly - 2 sets - 10 reps

## 2020-04-15 ENCOUNTER — Ambulatory Visit: Payer: Managed Care, Other (non HMO)

## 2020-04-17 ENCOUNTER — Ambulatory Visit: Payer: Managed Care, Other (non HMO)

## 2021-01-22 ENCOUNTER — Encounter: Payer: Self-pay | Admitting: Emergency Medicine

## 2021-01-22 ENCOUNTER — Other Ambulatory Visit: Payer: Self-pay

## 2021-01-22 ENCOUNTER — Emergency Department
Admission: EM | Admit: 2021-01-22 | Discharge: 2021-01-22 | Disposition: A | Discharge status: Home / Self Care | Payer: Managed Care, Other (non HMO) | Attending: Emergency Medicine | Admitting: Emergency Medicine

## 2021-01-22 DIAGNOSIS — W5501XA Bitten by cat, initial encounter: Secondary | ICD-10-CM | POA: Insufficient documentation

## 2021-01-22 DIAGNOSIS — Z7982 Long term (current) use of aspirin: Secondary | ICD-10-CM | POA: Diagnosis not present

## 2021-01-22 DIAGNOSIS — Z23 Encounter for immunization: Secondary | ICD-10-CM | POA: Insufficient documentation

## 2021-01-22 DIAGNOSIS — S61452A Open bite of left hand, initial encounter: Secondary | ICD-10-CM | POA: Diagnosis not present

## 2021-01-22 DIAGNOSIS — Z2914 Encounter for prophylactic rabies immune globin: Secondary | ICD-10-CM | POA: Diagnosis not present

## 2021-01-22 DIAGNOSIS — Z8616 Personal history of COVID-19: Secondary | ICD-10-CM | POA: Diagnosis not present

## 2021-01-22 DIAGNOSIS — S61451A Open bite of right hand, initial encounter: Secondary | ICD-10-CM | POA: Diagnosis not present

## 2021-01-22 DIAGNOSIS — Z203 Contact with and (suspected) exposure to rabies: Secondary | ICD-10-CM | POA: Insufficient documentation

## 2021-01-22 DIAGNOSIS — Y92009 Unspecified place in unspecified non-institutional (private) residence as the place of occurrence of the external cause: Secondary | ICD-10-CM | POA: Insufficient documentation

## 2021-01-22 DIAGNOSIS — I10 Essential (primary) hypertension: Secondary | ICD-10-CM | POA: Diagnosis not present

## 2021-01-22 DIAGNOSIS — Z79899 Other long term (current) drug therapy: Secondary | ICD-10-CM | POA: Insufficient documentation

## 2021-01-22 MED ORDER — TETANUS-DIPHTH-ACELL PERTUSSIS 5-2.5-18.5 LF-MCG/0.5 IM SUSY
0.5000 mL | PREFILLED_SYRINGE | Freq: Once | INTRAMUSCULAR | Status: AC
  Administered 2021-01-22: 0.5 mL via INTRAMUSCULAR
  Filled 2021-01-22: qty 0.5

## 2021-01-22 MED ORDER — AMOXICILLIN-POT CLAVULANATE 875-125 MG PO TABS: 1.0000 | ORAL_TABLET | Freq: Two times a day (BID) | ORAL | 0 refills | Status: DC

## 2021-01-22 MED ORDER — RABIES IMMUNE GLOBULIN 150 UNIT/ML IM INJ
20.0000 [IU]/kg | INJECTION | Freq: Once | INTRAMUSCULAR | Status: AC
  Administered 2021-01-22: 2100 [IU] via INTRAMUSCULAR
  Filled 2021-01-22: qty 14

## 2021-01-22 MED ORDER — RABIES VACCINE, PCEC IM SUSR
1.0000 mL | Freq: Once | INTRAMUSCULAR | Status: AC
  Administered 2021-01-22: 1 mL via INTRAMUSCULAR
  Filled 2021-01-22: qty 1

## 2021-01-22 NOTE — Discharge Instructions (Addendum)
He will need to follow-up with the emergency department or [place] urgent care on days 3, 7 and 14 for rabies vaccine.  Only 1 shot per day.  Call the urgent care before going to ensure that they have the vaccine.

## 2021-01-22 NOTE — ED Triage Notes (Signed)
Pt to ED from home c/o cat bite tonight to bilateral hands on knuckles.  Cat is stray cat that roams neighborhood.

## 2021-01-22 NOTE — ED Provider Notes (Signed)
[facility] Emergency Department Provider Note  ____________________________________________  Time seen: Approximately 10:17 PM  I have reviewed the triage vital signs and the nursing notes.   HISTORY  Chief Complaint Animal Bite    HPI Louay Myrie is a 65 y.o. male who presents the emergency department complaining of cat bite to bilateral hands.  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  Patient is very familiar with this animal, states that tonight it was visualized that the patient was guarding his left leg and appeared to sustain an injury to the leg.  The patient and his wife at the animal, and the cat was brought into the house so that they could take it to the vet.  The patient states that the cat ran underneath the bed and and then attempt to remove the cat from underneath the bed he was bitten to both hands.  Patient states that he was wearing gloves but unfortunately the teeth penetrated through the gloves.  Patient has wounds to the dorsal aspect of both hands.  No active bleeding.  No visible foreign body.   Patient is concerned as his last tetanus shot was greater than 10 years ago.  Patient is also concerned for infection and possible rabies series.  No other complaints or injury at this time.        Past Medical History:  Diagnosis Date  . Allergic rhinitis   . Hyperlipidemia   . Hypertension   . OSA (obstructive sleep apnea)     Patient Active Problem List   Diagnosis Date Noted  . COVID-19   . Hypoxia   . Pneumonia due to COVID-19 virus 11/16/2019  . Hypokalemia 11/16/2019  . Transaminitis 11/16/2019  . Seasonal and perennial allergic rhinitis 07/14/2017  . Adverse food reaction 01/18/2017  . Allergic rhinitis due to pollen 02/24/2011  . Obstructive sleep apnea 08/30/2009  . RHINOSINUSITIS, ACUTE 01/03/2009  . HYPERLIPIDEMIA  06/28/2008  . Essential hypertension 06/28/2008  . Chronic nonseasonal allergic rhinitis due to fungal spores 10/12/2007    Past Surgical History:  Procedure Laterality Date  . NASAL SEPTUM SURGERY     x2    Prior to Admission medications   Medication Sig Start Date End Date Taking? Authorizing Provider  amoxicillin-clavulanate (AUGMENTIN) 875-125 MG tablet Take 1 tablet by mouth 2 (two) times daily. 01/22/21  Yes Lonzie Simmer, Delorise Royals, PA-C  amLODipine (NORVASC) 2.5 MG tablet Take 2.5 mg by mouth every evening.  10/19/16   [provider]  Ascorbic Acid (VITAMIN C) 1000 MG tablet Take 1,000 mg by mouth daily.      [provider]  aspirin EC 81 MG tablet Take 81 mg by mouth daily.    [provider]  Cholecalciferol (VITAMIN D) 1000 UNITS capsule Take 1,000 Units by mouth daily.      [provider]  Coenzyme Q10 (COQ10) 100 MG CAPS Take 1 capsule by mouth daily.      [provider]  diphenhydrAMINE (BENADRYL) 25 MG tablet Take 25 mg by mouth at bedtime as needed.      [provider]  levocetirizine (XYZAL) 5 MG tablet 1 daily for allergy Patient taking differently: Take 2.5 mg by mouth daily.  09/11/15   Waymon Budge, MD  losartan-hydrochlorothiazide (HYZAAR) 100-12.5 MG per tablet Take 1 tablet by mouth every evening.     [provider]  Magnesium 250 MG TABS Take 1 tablet by mouth daily.      [provider]  simvastatin (ZOCOR) 40 MG tablet Take 40 mg by mouth 4 (four) times a week.     [provider]  zinc gluconate 50 MG tablet Take 50 mg by mouth daily.      [provider]    Allergies Molds & smuts  Family History  Problem Relation Age of Onset  . Breast cancer Mother   . Bradycardia Father        with pacemaker  . Allergic rhinitis Neg Hx   . Angioedema Neg Hx   . Asthma Neg Hx   . Atopy Neg Hx   . Eczema Neg Hx   . Immunodeficiency Neg Hx   . Urticaria Neg Hx      Social History Social History   Tobacco Use  . Smoking status: Never Smoker  . Smokeless tobacco: Never Used  Vaping Use  . Vaping Use: Never used  Substance Use Topics  . Alcohol use: Yes    Alcohol/week: 5.0 standard drinks    Types: 5 Glasses of wine per week    Comment: 4 drinks a week  . Drug use: No     Review of Systems  Constitutional: No fever/chills Eyes: No visual changes. No discharge ENT: No upper respiratory complaints. Cardiovascular: no chest pain. Respiratory: no cough. No SOB. Gastrointestinal: No abdominal pain.  No nausea, no vomiting.  No diarrhea.  No constipation. Musculoskeletal: Animal bites to bilateral hands Skin: Negative for rash, abrasions, lacerations, ecchymosis. Neurological: Negative for headaches, focal weakness or numbness.  10 System ROS otherwise negative.  ____________________________________________   PHYSICAL EXAM:  VITAL SIGNS: ED Triage Vitals  Enc Vitals Group     BP 01/22/21 2014 (!) 147/90     Pulse Rate 01/22/21 2014 65     Resp 01/22/21 2014 14     Temp 01/22/21 2014 98.1 F (36.7 C)     Temp Source 01/22/21 2014 Oral     SpO2 01/22/21 2014 95 %     Weight 01/22/21 2015 235 lb (106.6 kg)     Height 01/22/21 2015 5\' 6"  (1.676 m)     Head Circumference --      Peak Flow --      Pain Score 01/22/21 2015 1     Pain Loc --      Pain Edu? --      Excl. in GC? --      Constitutional: Alert and oriented. Well appearing and in no acute distress. Eyes: Conjunctivae are normal. PERRL. EOMI. Head: Atraumatic. ENT:      Ears:       Nose: No congestion/rhinnorhea.      Mouth/Throat: Mucous membranes are moist.  Neck: No stridor.    Cardiovascular: Normal rate, regular rhythm. Normal S1 and S2.  Good peripheral circulation. Respiratory: Normal respiratory effort without tachypnea or retractions. Lungs CTAB. Good air entry to the bases with no decreased or absent breath sounds. Musculoskeletal: Full range of  motion to all extremities. No gross deformities appreciated.  Visualization of bilateral hands reveals several puncture wounds to the dorsal aspect of both hands.  No bleeding.  No visible foreign body.  Good range of motion all digits.  Nontender to palpation.  No palpable abnormality or finding. Neurologic:  Normal speech and language. No gross focal neurologic deficits are appreciated.  Skin:  Skin is warm, dry and intact. No rash noted. Psychiatric: Mood  and affect are normal. Speech and behavior are normal. Patient exhibits appropriate insight and judgement.   ____________________________________________   LABS (all labs ordered are listed, but only abnormal results are displayed)  Labs Reviewed - No data to display ____________________________________________  EKG   ____________________________________________  RADIOLOGY   No results found.  ____________________________________________    PROCEDURES  Procedure(s) performed:    Procedures    Medications  rabies vaccine (RABAVERT) injection 1 mL (1 mL Intramuscular Given 01/22/21 2207)  rabies immune globulin (HYPERAB/KEDRAB) injection 2,100 Units (2,100 Units Intramuscular Given 01/22/21 2208)  Tdap (BOOSTRIX) injection 0.5 mL (0.5 mLs Intramuscular Given 01/22/21 2209)     ____________________________________________   INITIAL IMPRESSION / ASSESSMENT AND PLAN / ED COURSE  Pertinent labs & imaging results that were available during my care of the patient were reviewed by me and considered in my medical decision making (see chart for details).  Review of the [place] CSRS was performed in accordance of the [facility] prior to dispensing any controlled drugs.           Patient's diagnosis is consistent with cat bite, need for tetanus and rabies vaccinations.  Patient presented to the emergency department after being bitten by a stray cat.  The cat is well-known to the patient and states that it was acting appropriately  prior to this injury.  Patient tried to pick up the cat from underneath the bed when he was bitten.  The cat had sustained an injury to the leg and they were attempting to take it to the vet.  Per the patient the vet does not believe the injury was secondary to a another animal, but after being bitten patient is concerned for infection/rabies.  Patient's last tetanus shot was greater than 10 years ago and is updated today.  Patient was placed on antibiotics prophylactically.  Rabies series is started as the animal is a stray and is not up-to-date on rabies vaccination.  Patient will return here or go to [facility] urgent care in 3 days for second rabies vaccine..  Follow-up with primary care as needed.  Concerning signs and symptoms of infection are discussed with the patient and he verbalizes that he will return for any of the symptoms.  Patient is given ED precautions to return to the ED for any worsening or new symptoms.     ____________________________________________  FINAL CLINICAL IMPRESSION(S) / ED DIAGNOSES  Final diagnoses:  Cat bite, initial encounter      NEW MEDICATIONS STARTED DURING THIS VISIT:  ED Discharge Orders         Ordered    amoxicillin-clavulanate (AUGMENTIN) 875-125 MG tablet  2 times daily        01/22/21 2220              This chart was dictated using voice recognition software/Dragon. Despite best efforts to proofread, errors can occur which can change the meaning. Any change was purely unintentional.    Racheal Patches, PA-C 01/22/21 2244    Minna Antis, MD 01/22/21 2340

## 2021-01-25 ENCOUNTER — Other Ambulatory Visit: Payer: Self-pay

## 2021-01-25 ENCOUNTER — Ambulatory Visit
Admission: EM | Admit: 2021-01-25 | Discharge: 2021-01-25 | Disposition: A | Discharge status: Home / Self Care | Payer: Managed Care, Other (non HMO) | Attending: Sports Medicine | Admitting: Sports Medicine

## 2021-01-25 DIAGNOSIS — Z203 Contact with and (suspected) exposure to rabies: Secondary | ICD-10-CM | POA: Diagnosis not present

## 2021-01-25 DIAGNOSIS — Z23 Encounter for immunization: Secondary | ICD-10-CM | POA: Diagnosis not present

## 2021-01-25 MED ORDER — RABIES VACCINE, PCEC IM SUSR
1.0000 mL | Freq: Once | INTRAMUSCULAR | Status: AC
  Administered 2021-01-25: 1 mL via INTRAMUSCULAR

## 2021-01-25 NOTE — ED Triage Notes (Signed)
Patient in today to receive rabies vaccine for Day 3. Patient received the first series at [facility] ED on 01/22/21. Patient received rabies vaccine IM left deltoid. Patient tolerated without incident.

## 2021-01-29 ENCOUNTER — Ambulatory Visit
Admission: EM | Admit: 2021-01-29 | Discharge: 2021-01-29 | Disposition: A | Discharge status: Home / Self Care | Payer: Managed Care, Other (non HMO) | Attending: Family Medicine | Admitting: Family Medicine

## 2021-01-29 ENCOUNTER — Encounter: Payer: Self-pay | Admitting: Emergency Medicine

## 2021-01-29 ENCOUNTER — Other Ambulatory Visit: Payer: Self-pay

## 2021-01-29 DIAGNOSIS — Z203 Contact with and (suspected) exposure to rabies: Secondary | ICD-10-CM

## 2021-01-29 DIAGNOSIS — Z23 Encounter for immunization: Secondary | ICD-10-CM

## 2021-01-29 MED ORDER — RABIES VACCINE, PCEC IM SUSR
1.0000 mL | Freq: Once | INTRAMUSCULAR | Status: AC
  Administered 2021-01-29: 1 mL via INTRAMUSCULAR

## 2021-01-29 NOTE — ED Triage Notes (Signed)
Pt presents to [facility] for day 7 rabies vaccine.

## 2021-02-05 ENCOUNTER — Ambulatory Visit
Admission: EM | Admit: 2021-02-05 | Discharge: 2021-02-05 | Disposition: A | Discharge status: Home / Self Care | Payer: Managed Care, Other (non HMO) | Attending: Emergency Medicine | Admitting: Emergency Medicine

## 2021-02-05 ENCOUNTER — Other Ambulatory Visit: Payer: Self-pay

## 2021-02-05 DIAGNOSIS — Z203 Contact with and (suspected) exposure to rabies: Secondary | ICD-10-CM

## 2021-02-05 MED ORDER — RABIES VACCINE, PCEC IM SUSR
1.0000 mL | Freq: Once | INTRAMUSCULAR | Status: AC
  Administered 2021-02-05: 1 mL via INTRAMUSCULAR

## 2021-02-05 NOTE — ED Triage Notes (Signed)
Patient states that he is here for day 14 rabies vaccines. Patient states that he has tolerated previous injections well.

## 2021-02-21 ENCOUNTER — Other Ambulatory Visit: Payer: Self-pay | Admitting: Internal Medicine

## 2021-02-21 DIAGNOSIS — Z136 Encounter for screening for cardiovascular disorders: Secondary | ICD-10-CM

## 2021-03-11 ENCOUNTER — Ambulatory Visit
Admission: RE | Admit: 2021-03-11 | Discharge: 2021-03-11 | Disposition: A | Discharge status: Home / Self Care | Payer: No Typology Code available for payment source | Source: Ambulatory Visit | Attending: Internal Medicine | Admitting: Internal Medicine

## 2021-03-11 DIAGNOSIS — Z136 Encounter for screening for cardiovascular disorders: Secondary | ICD-10-CM

## 2021-06-29 IMAGING — CT CT CARDIAC CORONARY ARTERY CALCIUM SCORE
3 series · 14 of 20 positions shown, 16 images · non-contrast
Comparison: None.

CLINICAL DATA: 65-year-old Caucasian male with
hypercholesterolemia, hypertension and family history of heart
disease.

EXAM:
CT CARDIAC CORONARY ARTERY CALCIUM SCORE
TECHNIQUE: Non-contrast imaging through the heart was performed using
prospective ECG gating. Image post processing was performed on an
independent workstation, allowing for quantitative analysis of the
heart and coronary arteries. Note that this exam targets the heart
and the chest was not imaged in its entirety.

[Series 2: calcium scoring 2.00 qr36 bestdiast 67% hrt calciu · axial · 0.45mm/px · z∈[+1598,+1658]mm · 4 of 50 slices shown]
[im 10/50  vessel]
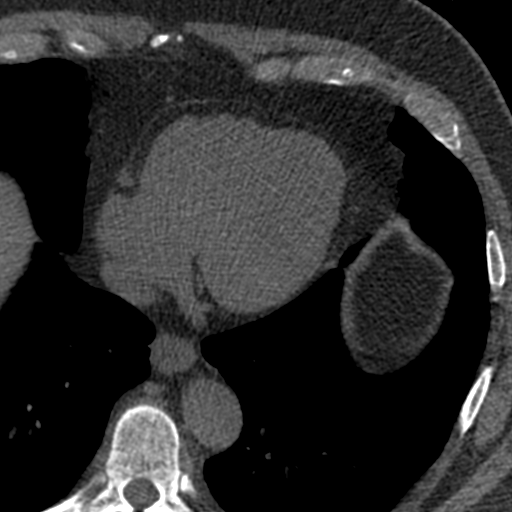
[im 20/50  vessel]
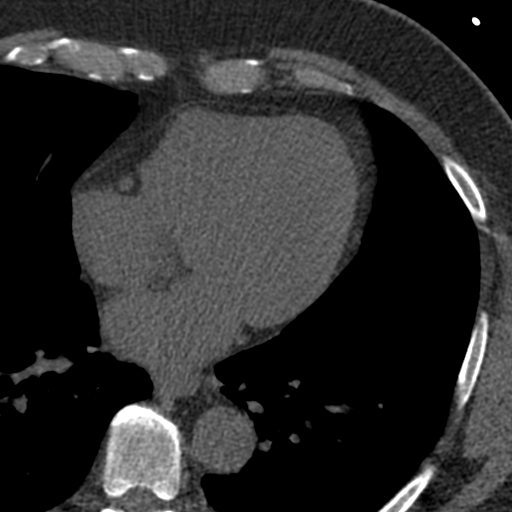
[im 30/50  vessel]
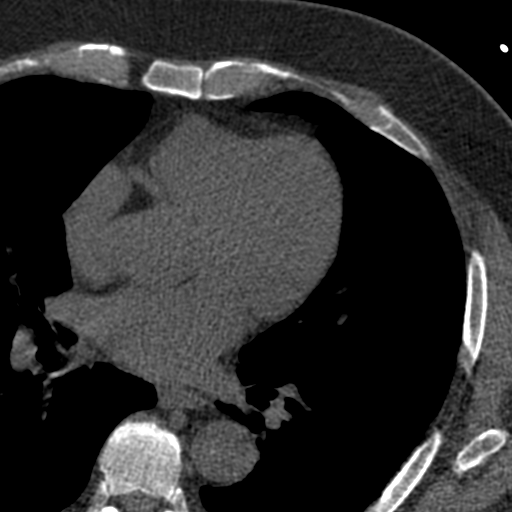
[im 40/50  vessel]
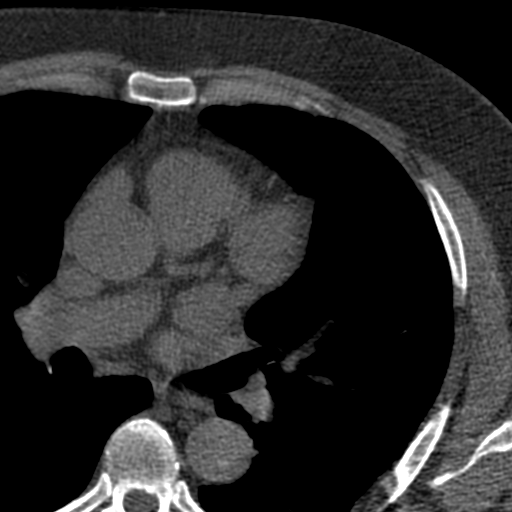

[Series 3: calcium scoring 2.00 br40 bestdiast 67% axial · axial · 0.65mm/px · z∈[+1596,+1660]mm · 5 of 50 slices shown, 7 images]
[im 9/50  vessel]
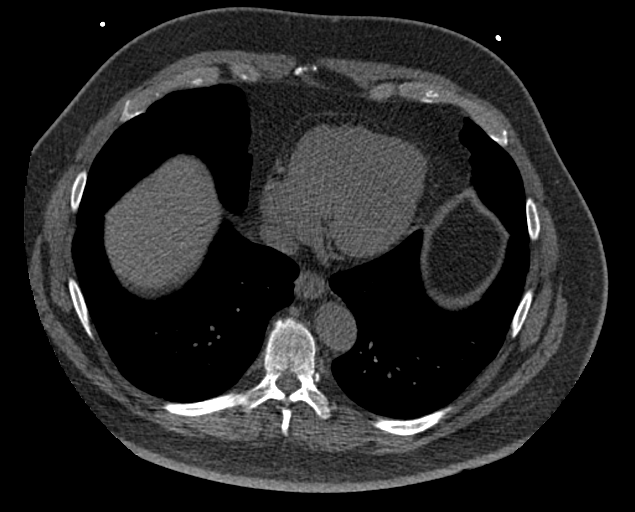
[im 9/50  lung]
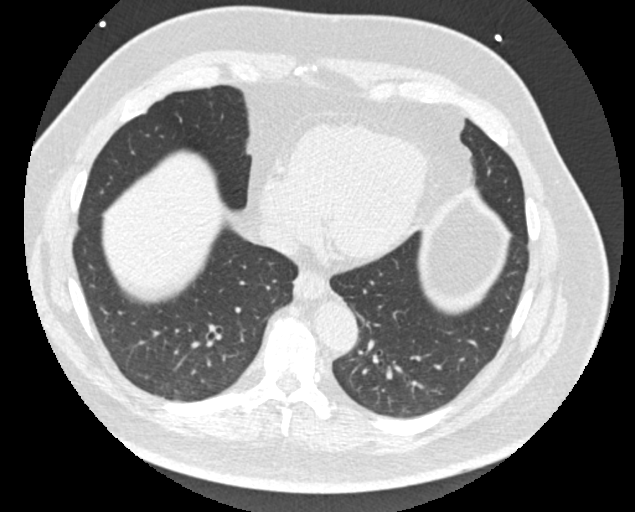
[im 17/50  vessel]
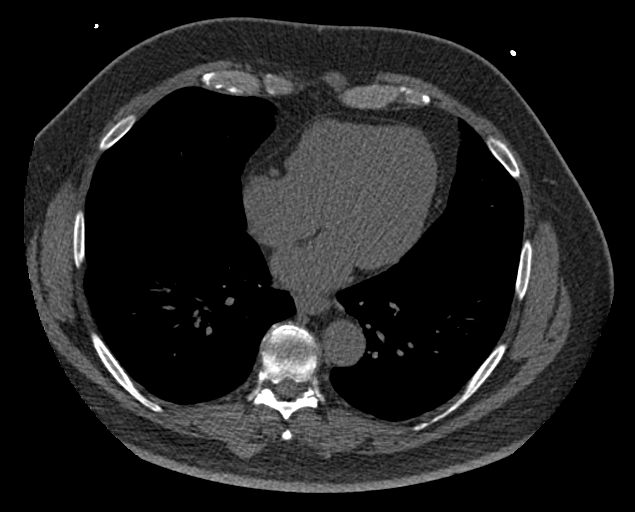
[im 25/50  vessel]
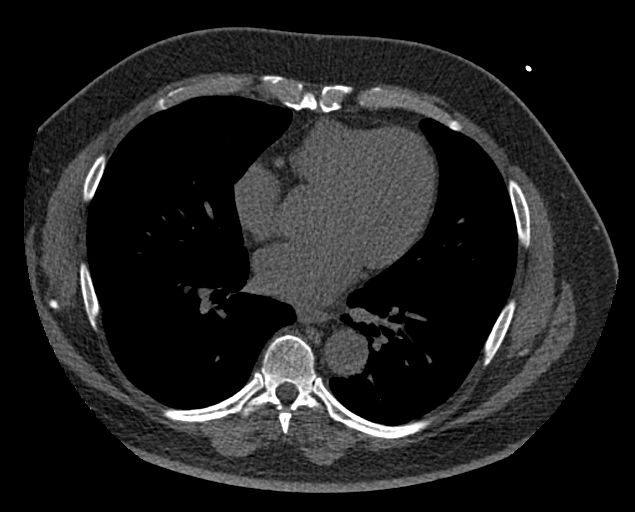
[im 33/50  vessel]
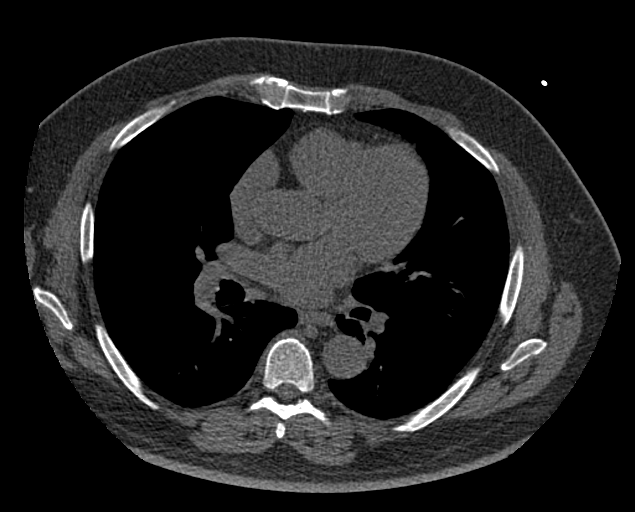
[im 41/50  vessel]
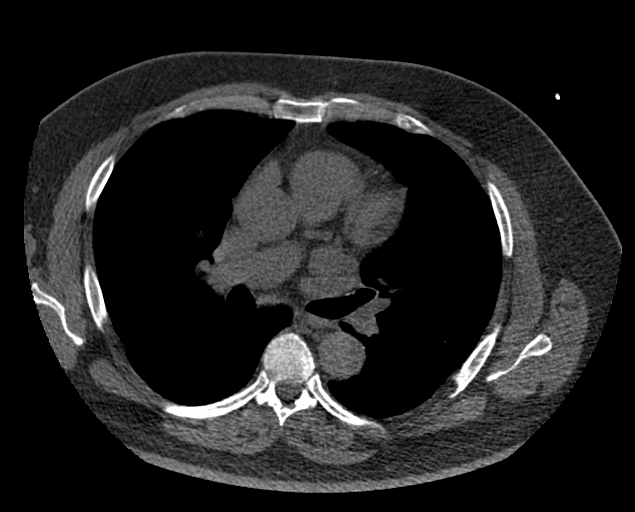
[im 41/50  lung]
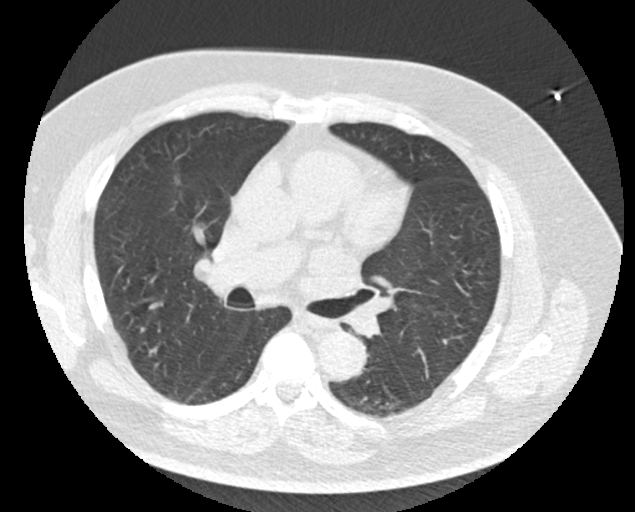

[Series 9: calcium scoring 2.00 br60 bestdiast 67% lungs · axial · 0.65mm/px · z∈[+1596,+1660]mm · 5 of 50 slices shown]
[im 9/50  vessel]
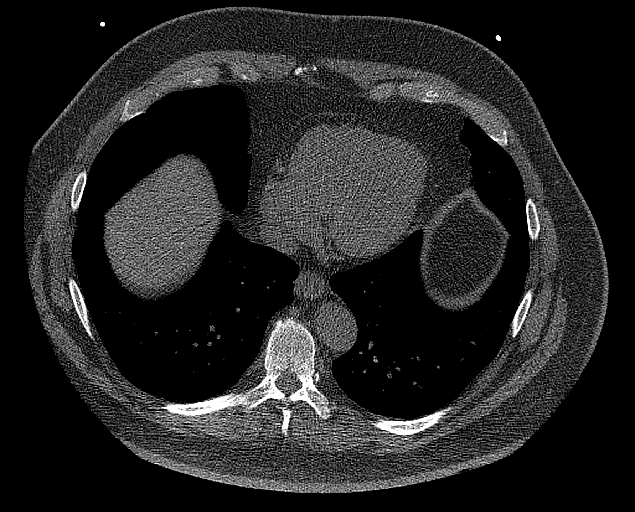
[im 17/50  vessel]
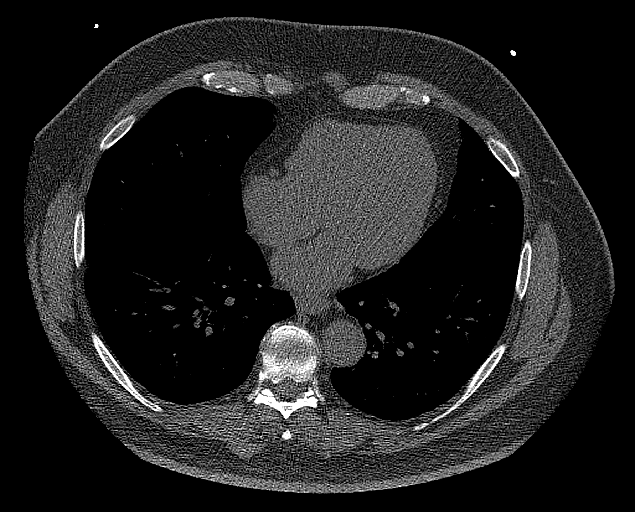
[im 25/50  vessel]
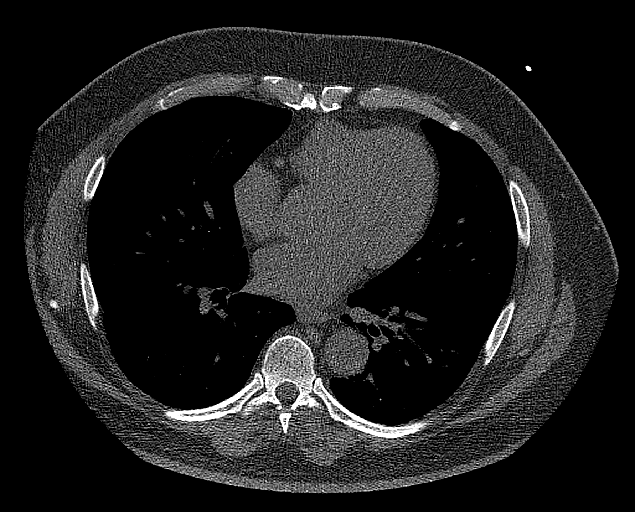
[im 33/50  vessel]
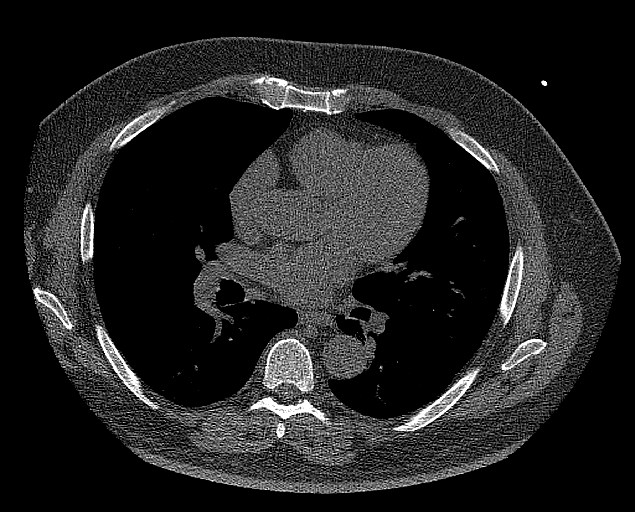
[im 41/50  vessel]
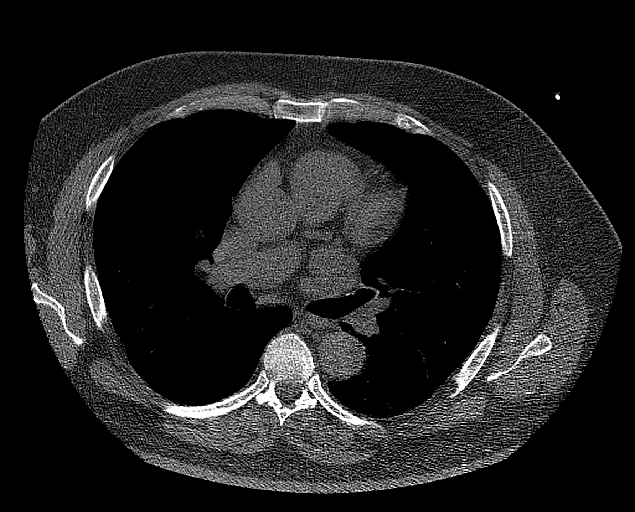

[14 of 20 positions shown; findings below may reference images not displayed]

FINDINGS: CORONARY CALCIUM SCORES:

Left Main: 0

LAD: 0

LCx: 0

RCA: 0

Total Agatston Score: 0

[HOSPITAL] percentile: 0

AORTA MEASUREMENTS:

Ascending Aorta: 36 mm

Descending Aorta: 29 mm

OTHER FINDINGS:

The heart size is within normal limits. No pericardial fluid
identified. Visualized segments of the thoracic aorta and central
pulmonary arteries are of normal caliber. There is a mild amount of
calcified plaque in the descending thoracic aorta. Visualized
mediastinum and hilar regions demonstrate no lymphadenopathy or
masses. Visualized lungs show no evidence of pulmonary edema,
consolidation, pneumothorax, nodule or pleural fluid. Visualized
upper abdomen and bony structures are unremarkable.
IMPRESSION: 1. Coronary calcium score of 0.
2. Mild amount of calcified plaque in the descending thoracic aorta.

Aortic Atherosclerosis (IPB39-8MG.G).

## 2022-08-01 ENCOUNTER — Ambulatory Visit
Admission: EM | Admit: 2022-08-01 | Discharge: 2022-08-01 | Disposition: A | Discharge status: Home / Self Care | Payer: Managed Care, Other (non HMO)

## 2022-08-01 DIAGNOSIS — J0100 Acute maxillary sinusitis, unspecified: Secondary | ICD-10-CM | POA: Diagnosis not present

## 2022-08-01 MED ORDER — AMOXICILLIN 875 MG PO TABS: 875.0000 mg | ORAL_TABLET | Freq: Two times a day (BID) | ORAL | 0 refills | Status: AC

## 2022-08-01 NOTE — Discharge Instructions (Addendum)
Take the amoxicillin as directed.  Follow up with your primary care provider if your symptoms are not improving.   ° ° °

## 2022-08-01 NOTE — ED Triage Notes (Signed)
Patient to UC with complaints of URI x10 days. States he has had a lot of sinus drainage, clear productive cough. Denies any known fevers. Negative covid test at home.

## 2022-08-01 NOTE — ED Provider Notes (Signed)
Ralph Ware    CSN: [id number] Arrival date & time: 08/01/22  1423      History   Chief Complaint Chief Complaint  Patient presents with   Cough    HPI Ralph Ware is a 66 y.o. male.  Patient presents with sinus congestion, postnasal drip, sinus pressure, cough x10 days.  OTC treatment attempted without relief.  No fever, chills, rash, sore throat, chest pain, shortness of breath, or other symptoms.  Negative COVID test at home.  His medical history includes hypertension, hyperlipidemia, allergic rhinitis, obstructive sleep apnea.  The history is provided by the patient and medical records.    Past Medical History:  Diagnosis Date   Allergic rhinitis    Hyperlipidemia    Hypertension    OSA (obstructive sleep apnea)     Patient Active Problem List   Diagnosis Date Noted   COVID-19    Hypoxia    Pneumonia due to COVID-19 virus 11/16/2019   Hypokalemia 11/16/2019   Transaminitis 11/16/2019   Seasonal and perennial allergic rhinitis 07/14/2017   Adverse food reaction 01/18/2017   Allergic rhinitis due to pollen 02/24/2011   Obstructive sleep apnea 08/30/2009   RHINOSINUSITIS, ACUTE 01/03/2009   HYPERLIPIDEMIA 06/28/2008   Essential hypertension 06/28/2008   Chronic nonseasonal allergic rhinitis due to fungal spores 10/12/2007    Past Surgical History:  Procedure Laterality Date   NASAL SEPTUM SURGERY     x2       Home Medications    Prior to Admission medications   Medication Sig Start Date End Date Taking? Authorizing Provider  amoxicillin (AMOXIL) 875 MG tablet Take 1 tablet (875 mg total) by mouth 2 (two) times daily for 10 days. 08/01/22 08/11/22 Yes Mickie Bail, NP  amLODipine (NORVASC) 2.5 MG tablet Take 2.5 mg by mouth every evening.  10/19/16   [provider]  Ascorbic Acid (VITAMIN C) 1000 MG tablet Take 1,000 mg by mouth daily.    [provider]  aspirin EC 81 MG tablet Take 81 mg by mouth daily.    [provider]  Azelastine HCl 0.15 % SOLN Place 2 sprays into the nose daily.    [provider]  Cholecalciferol (VITAMIN D) 1000 UNITS capsule Take 1,000 Units by mouth daily.    [provider]  Coenzyme Q10 (COQ10) 100 MG CAPS Take 1 capsule by mouth daily.    [provider]  diphenhydrAMINE (BENADRYL) 25 MG tablet Take 25 mg by mouth at bedtime as needed.    [provider]  levocetirizine (XYZAL) 5 MG tablet 1 daily for allergy Patient taking differently: Take 2.5 mg by mouth daily.  09/11/15   Waymon Budge, MD  losartan-hydrochlorothiazide (HYZAAR) 100-12.5 MG per tablet Take 1 tablet by mouth every evening.    [provider]  Magnesium 250 MG TABS Take 1 tablet by mouth daily.    [provider]  simvastatin (ZOCOR) 40 MG tablet Take 40 mg by mouth 4 (four) times a week.     [provider]  zinc gluconate 50 MG tablet Take 50 mg by mouth daily.    [provider]    Family History Family History  Problem Relation Age of Onset   Breast cancer Mother    Bradycardia Father        with pacemaker   Allergic rhinitis Neg Hx    Angioedema Neg Hx    Asthma Neg Hx    Atopy Neg Hx  Eczema Neg Hx    Immunodeficiency Neg Hx    Urticaria Neg Hx     Social History Social History   Tobacco Use   Smoking status: Never   Smokeless tobacco: Never  Vaping Use   Vaping Use: Never used  Substance Use Topics   Alcohol use: Yes    Alcohol/week: 5.0 standard drinks of alcohol    Types: 5 Glasses of wine per week    Comment: 4 drinks a week   Drug use: No     Allergies   Molds & smuts and Olmesartan   Review of Systems Review of Systems  Constitutional:  Negative for chills and fever.  HENT:  Positive for congestion, postnasal drip, rhinorrhea and sinus pressure. Negative for ear pain and sore throat.   Respiratory:  Positive for cough. Negative for shortness of breath.   Cardiovascular:  Negative  for chest pain and palpitations.  Gastrointestinal:  Negative for diarrhea and vomiting.  Skin:  Negative for color change and rash.  All other systems reviewed and are negative.    Physical Exam Triage Vital Signs ED Triage Vitals  Enc Vitals Group     BP      Pulse      Resp      Temp      Temp src      SpO2      Weight      Height      Head Circumference      Peak Flow      Pain Score      Pain Loc      Pain Edu?      Excl. in GC?    No data found.  Updated Vital Signs BP 114/73 (BP Location: Left Arm)   Pulse 74   Temp 98.4 F (36.9 C) (Oral)   Resp 16   Ht 5\' 6"  (1.676 m)   Wt 230 lb (104.3 kg)   SpO2 96%   BMI 37.12 kg/m   Visual Acuity Right Eye Distance:   Left Eye Distance:   Bilateral Distance:    Right Eye Near:   Left Eye Near:    Bilateral Near:     Physical Exam Vitals and nursing note reviewed.  Constitutional:      General: He is not in acute distress.    Appearance: He is well-developed. He is not ill-appearing.  HENT:     Right Ear: Tympanic membrane normal.     Left Ear: Tympanic membrane normal.     Nose: Congestion present.     Mouth/Throat:     Mouth: Mucous membranes are moist.     Pharynx: Oropharynx is clear.  Cardiovascular:     Rate and Rhythm: Normal rate and regular rhythm.     Heart sounds: Normal heart sounds.  Pulmonary:     Effort: Pulmonary effort is normal. No respiratory distress.     Breath sounds: Normal breath sounds.  Musculoskeletal:     Cervical back: Neck supple.  Skin:    General: Skin is warm and dry.  Neurological:     Mental Status: He is alert.  Psychiatric:        Mood and Affect: Mood normal.        Behavior: Behavior normal.      UC Treatments / Results  Labs (all labs ordered are listed, but only abnormal results are displayed) Labs Reviewed - No data to display  EKG   Radiology No results found.  Procedures Procedures (including critical care time)  Medications Ordered in  UC Medications - No data to display  Initial Impression / Assessment and Plan / UC Course  I have reviewed the triage vital signs and the nursing notes.  Pertinent labs & imaging results that were available during my care of the patient were reviewed by me and considered in my medical decision making (see chart for details).   Acute sinusitis.  Patient has been symptomatic for 10 days and is not improving with OTC treatment.  Treating today with amoxicillin.  Discussed symptomatic treatment also including Tylenol or ibuprofen.  Instructed patient to follow-up with his PCP if his symptoms are not improving.  He agrees to plan of care.   Final Clinical Impressions(s) / UC Diagnoses   Final diagnoses:  Acute non-recurrent maxillary sinusitis     Discharge Instructions      Take the amoxicillin as directed.  Follow up with your primary care provider if your symptoms are not improving.        ED Prescriptions     Medication Sig Dispense Auth. Provider   amoxicillin (AMOXIL) 875 MG tablet Take 1 tablet (875 mg total) by mouth 2 (two) times daily for 10 days. 20 tablet Mickie Bail, NP      PDMP not reviewed this encounter.   Mickie Bail, NP 08/01/22 1447

## 2023-06-09 ENCOUNTER — Ambulatory Visit: Payer: Managed Care, Other (non HMO) | Admitting: Orthopaedic Surgery

## 2023-06-09 DIAGNOSIS — M65331 Trigger finger, right middle finger: Secondary | ICD-10-CM | POA: Diagnosis not present

## 2023-06-09 MED ORDER — BUPIVACAINE HCL 0.5 % IJ SOLN
0.3300 mL | INTRAMUSCULAR | Status: AC | PRN
  Administered 2023-06-09: .33 mL

## 2023-06-09 MED ORDER — METHYLPREDNISOLONE ACETATE 40 MG/ML IJ SUSP
13.3300 mg | INTRAMUSCULAR | Status: AC | PRN
  Administered 2023-06-09: 13.33 mg

## 2023-06-09 MED ORDER — LIDOCAINE HCL 1 % IJ SOLN
0.3000 mL | INTRAMUSCULAR | Status: AC | PRN
  Administered 2023-06-09: .3 mL

## 2023-06-09 NOTE — Progress Notes (Signed)
Office Visit Note   Patient: Ralph Ware           Date of Birth: 07/14/1956           MRN: [id number] Visit Date: 06/09/2023              Requested by: Thana Ates, MD [address] [place],  [place] [address] PCP: Thana Ates, MD   Assessment & Plan: Visit Diagnoses:  1. Trigger finger, right middle finger     Plan: Impression is 67 year old gentleman with right middle trigger finger.  Disease process explained and we will proceed with a cortisone injection.  I have also recommended a finger splint to wear at night for the next week or so.  Follow-Up Instructions: No follow-ups on file.   Orders:  No orders of the defined types were placed in this encounter.  No orders of the defined types were placed in this encounter.     Procedures: Hand/UE Inj: R long A1 for trigger finger on 06/09/2023 10:47 AM Indications: pain Details: 25 G needle Medications: 0.3 mL lidocaine 1 %; 0.33 mL bupivacaine 0.5 %; 13.33 mg methylPREDNISolone acetate 40 MG/ML Outcome: tolerated well, no immediate complications Consent was given by the patient. Patient was prepped and draped in the usual sterile fashion.       Clinical Data: No additional findings.   Subjective: Chief Complaint  Patient presents with   Right Hand - Pain    HPI Grzegorz is a very pleasant 67 year old gentleman who started having a trigger finger in his right middle finger last September when he traveled to [place].  He did a lot of lifting heavy luggage during the trip.  Denies any particular injuries.  He will often wake up with the finger locked in place.  It is painful to extend when it is locked. Review of Systems  Constitutional: Negative.   HENT: Negative.    Eyes: Negative.   Respiratory: Negative.    Cardiovascular: Negative.   Gastrointestinal: Negative.   Endocrine: Negative.   Genitourinary: Negative.   Skin: Negative.   Allergic/Immunologic: Negative.   Neurological: Negative.    Hematological: Negative.   Psychiatric/Behavioral: Negative.    All other systems reviewed and are negative.    Objective: Vital Signs: There were no vitals taken for this visit.  Physical Exam Vitals and nursing note reviewed.  Constitutional:      Appearance: He is well-developed.  HENT:     Head: Normocephalic and atraumatic.  Eyes:     Pupils: Pupils are equal, round, and reactive to light.  Pulmonary:     Effort: Pulmonary effort is normal.  Abdominal:     Palpations: Abdomen is soft.  Musculoskeletal:        General: Normal range of motion.     Cervical back: Neck supple.  Skin:    General: Skin is warm.  Neurological:     Mental Status: He is alert and oriented to person, place, and time.  Psychiatric:        Behavior: Behavior normal.        Thought Content: Thought content normal.        Judgment: Judgment normal.     Ortho Exam Examination of right middle finger shows tenderness over the A1 pulley.  No overt locking. Specialty Comments:  No specialty comments available.  Imaging: No results found.   PMFS History: Patient Active Problem List   Diagnosis Date Noted   COVID-19  Hypoxia    Pneumonia due to COVID-19 virus 11/16/2019   Hypokalemia 11/16/2019   Transaminitis 11/16/2019   Seasonal and perennial allergic rhinitis 07/14/2017   Adverse food reaction 01/18/2017   Allergic rhinitis due to pollen 02/24/2011   Obstructive sleep apnea 08/30/2009   RHINOSINUSITIS, ACUTE 01/03/2009   HYPERLIPIDEMIA 06/28/2008   Essential hypertension 06/28/2008   Chronic nonseasonal allergic rhinitis due to fungal spores 10/12/2007   Past Medical History:  Diagnosis Date   Allergic rhinitis    Hyperlipidemia    Hypertension    OSA (obstructive sleep apnea)     Family History  Problem Relation Age of Onset   Breast cancer Mother    Bradycardia Father        with pacemaker   Allergic rhinitis Neg Hx    Angioedema Neg Hx    Asthma Neg Hx    Atopy  Neg Hx    Eczema Neg Hx    Immunodeficiency Neg Hx    Urticaria Neg Hx     Past Surgical History:  Procedure Laterality Date   NASAL SEPTUM SURGERY     x2   Social History   Occupational History   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

## 2024-01-07 ENCOUNTER — Ambulatory Visit
Admission: EM | Admit: 2024-01-07 | Discharge: 2024-01-07 | Disposition: A | Discharge status: Home / Self Care | Payer: Managed Care, Other (non HMO) | Attending: Physician Assistant | Admitting: Physician Assistant

## 2024-01-07 ENCOUNTER — Encounter: Payer: Self-pay | Admitting: Emergency Medicine

## 2024-01-07 DIAGNOSIS — J029 Acute pharyngitis, unspecified: Secondary | ICD-10-CM

## 2024-01-07 DIAGNOSIS — R051 Acute cough: Secondary | ICD-10-CM | POA: Diagnosis present

## 2024-01-07 DIAGNOSIS — J069 Acute upper respiratory infection, unspecified: Secondary | ICD-10-CM | POA: Diagnosis present

## 2024-01-07 LAB — RESP PANEL BY RT-PCR (FLU A&B, COVID) ARPGX2
Influenza A by PCR: NEGATIVE
Influenza B by PCR: NEGATIVE
SARS Coronavirus 2 by RT PCR: NEGATIVE

## 2024-01-07 LAB — GROUP A STREP BY PCR: Group A Strep by PCR: NOT DETECTED

## 2024-01-07 MED ORDER — PROMETHAZINE-DM 6.25-15 MG/5ML PO SYRP: 5.0000 mL | ORAL_SOLUTION | Freq: Four times a day (QID) | ORAL | 0 refills | Status: AC | PRN

## 2024-01-07 NOTE — ED Provider Notes (Signed)
[facility]    CSN: [id number] Arrival date & time: 01/07/24  1123      History   Chief Complaint Chief Complaint  Patient presents with   Cough   Nasal Congestion   Sore Throat    HPI Ralph Ware is a 68 y.o. male presenting for cough, congestion, sore throat, and fatigue since yesterday.  Denies fever, chills, body aches, headaches, sore throat, chest pain, wheezing or shortness of breath.  Recent travel from [place].  Request to be checked for RSV in addition to COVID and flu.  Has taken OTC meds.  Medical history is significant for allergies, hypertension, hyperlipidemia and sleep apnea.  HPI  Past Medical History:  Diagnosis Date   Allergic rhinitis    Hyperlipidemia    Hypertension    OSA (obstructive sleep apnea)     Patient Active Problem List   Diagnosis Date Noted   COVID-19    Hypoxia    Pneumonia due to COVID-19 virus 11/16/2019   Hypokalemia 11/16/2019   Transaminitis 11/16/2019   Seasonal and perennial allergic rhinitis 07/14/2017   Adverse food reaction 01/18/2017   Allergic rhinitis due to pollen 02/24/2011   Obstructive sleep apnea 08/30/2009   RHINOSINUSITIS, ACUTE 01/03/2009   HYPERLIPIDEMIA 06/28/2008   Essential hypertension 06/28/2008   Chronic nonseasonal allergic rhinitis due to fungal spores 10/12/2007    Past Surgical History:  Procedure Laterality Date   NASAL SEPTUM SURGERY     x2       Home Medications    Prior to Admission medications   Medication Sig Start Date End Date Taking? Authorizing Provider  amLODipine (NORVASC) 2.5 MG tablet Take 2.5 mg by mouth every evening.  10/19/16  Yes [provider]  brimonidine-timolol (COMBIGAN) 0.2-0.5 % ophthalmic solution  11/24/23  Yes [provider]  Cholecalciferol (VITAMIN D) 1000 UNITS capsule Take 1,000 Units by mouth daily.   Yes [provider]  furosemide (LASIX) 20 MG tablet Take 20 mg by mouth daily. 12/09/23  Yes [provider]  losartan-hydrochlorothiazide (HYZAAR) 100-12.5 MG per tablet Take 1 tablet by mouth every evening.   Yes [provider]  promethazine-dextromethorphan (PROMETHAZINE-DM) 6.25-15 MG/5ML syrup Take 5 mLs by mouth 4 (four) times daily as needed. 01/07/24  Yes Shirlee Latch, PA-C  simvastatin (ZOCOR) 40 MG tablet Take 40 mg by mouth 4 (four) times a week.    Yes [provider]  Ascorbic Acid (VITAMIN C) 1000 MG tablet Take 1,000 mg by mouth daily.    [provider]  aspirin EC 81 MG tablet Take 81 mg by mouth daily.    [provider]  Azelastine HCl 0.15 % SOLN Place 2 sprays into the nose daily.    [provider]  Coenzyme Q10 (COQ10) 100 MG CAPS Take 1 capsule by mouth daily.    [provider]  diphenhydrAMINE (BENADRYL) 25 MG tablet Take 25 mg by mouth at bedtime as needed.    [provider]  levocetirizine (XYZAL) 5 MG tablet 1 daily for allergy Patient taking differently: Take 2.5 mg by mouth daily. 09/11/15   Jetty Duhamel D, MD  Magnesium 250 MG TABS Take 1 tablet by mouth daily.    [provider]  zinc gluconate 50 MG tablet Take 50 mg by mouth daily.    [provider]    Family History Family History  Problem Relation Age of Onset   Breast cancer Mother    Bradycardia Father  with pacemaker   Allergic rhinitis Neg Hx    Angioedema Neg Hx    Asthma Neg Hx    Atopy Neg Hx    Eczema Neg Hx    Immunodeficiency Neg Hx    Urticaria Neg Hx     Social History Social History   Tobacco Use   Smoking status: Never   Smokeless tobacco: Never  Vaping Use   Vaping status: Never Used  Substance Use Topics   Alcohol use: Yes    Alcohol/week: 5.0 standard drinks of alcohol    Types: 5 Glasses of wine per week    Comment: 4 drinks a week   Drug use: No     Allergies   Molds & smuts and Olmesartan   Review of Systems Review of Systems  Constitutional:  Positive for  fatigue. Negative for fever.  HENT:  Positive for congestion, rhinorrhea and sore throat. Negative for sinus pressure and sinus pain.   Respiratory:  Positive for cough. Negative for shortness of breath.   Gastrointestinal:  Negative for abdominal pain, diarrhea, nausea and vomiting.  Musculoskeletal:  Negative for myalgias.  Neurological:  Negative for weakness, light-headedness and headaches.  Hematological:  Negative for adenopathy.     Physical Exam Triage Vital Signs ED Triage Vitals  Encounter Vitals Group     BP      Systolic BP Percentile      Diastolic BP Percentile      Pulse      Resp      Temp      Temp src      SpO2      Weight      Height      Head Circumference      Peak Flow      Pain Score      Pain Loc      Pain Education      Exclude from Growth Chart    No data found.  Updated Vital Signs BP 116/88 (BP Location: Left Arm)   Pulse 65   Temp 98.7 F (37.1 C) (Oral)   Resp 15   Ht 5\' 6"  (1.676 m)   Wt 229 lb 15 oz (104.3 kg)   SpO2 95%   BMI 37.11 kg/m     Physical Exam Vitals and nursing note reviewed.  Constitutional:      General: He is not in acute distress.    Appearance: Normal appearance. He is well-developed. He is not ill-appearing.  HENT:     Head: Normocephalic and atraumatic.     Nose: Congestion present.     Mouth/Throat:     Mouth: Mucous membranes are moist.     Pharynx: Oropharynx is clear.  Eyes:     General: No scleral icterus.    Conjunctiva/sclera: Conjunctivae normal.  Cardiovascular:     Rate and Rhythm: Normal rate and regular rhythm.     Heart sounds: Normal heart sounds.  Pulmonary:     Effort: Pulmonary effort is normal. No respiratory distress.     Breath sounds: Normal breath sounds.  Musculoskeletal:     Cervical back: Neck supple.  Skin:    General: Skin is warm and dry.     Capillary Refill: Capillary refill takes less than 2 seconds.  Neurological:     General: No focal deficit present.      Mental Status: He is alert. Mental status is at baseline.     Motor: No weakness.     Gait:  Gait normal.  Psychiatric:        Mood and Affect: Mood normal.        Behavior: Behavior normal.      UC Treatments / Results  Labs (all labs ordered are listed, but only abnormal results are displayed) Labs Reviewed  RESP PANEL BY RT-PCR (FLU A&B, COVID) [id number]  GROUP A STREP BY PCR    EKG   Radiology No results found.  Procedures Procedures (including critical care time)  Medications Ordered in UC Medications - No data to display  Initial Impression / Assessment and Plan / UC Course  I have reviewed the triage vital signs and the nursing notes.  Pertinent labs & imaging results that were available during my care of the patient were reviewed by me and considered in my medical decision making (see chart for details).   68 year old male presents for onset of cough, sore throat, and congestion yesterday.  Recent travel abroad.  Vitals are stable and normal and he is overall well-appearing.  No acute distress.  On exam is nasal congestion.  Throat clear.  Chest clear to auscultation.  Heart regular rate and rhythm.  Respiratory panel and strep testing obtained.  Negative COVID, flu, RSV and strep testing.  Reviewed results with patient.  Viral illness.  Supportive care encouraged increasing rest and fluids.  Sent Promethazine DM.  Reviewed return precautions.   Final Clinical Impressions(s) / UC Diagnoses   Final diagnoses:  Viral upper respiratory tract infection  Acute cough  Sore throat     Discharge Instructions      -Negative COVID, flu, RSV and strep testing.  Likely another virus.  URI/COLD SYMPTOMS: Your exam today is consistent with a viral illness. Antibiotics are not indicated at this time. Use medications as directed, including cough syrup, nasal saline, and decongestants. Your symptoms should improve over the next few days and resolve within 7-10 days.  Increase rest and fluids. F/u if symptoms worsen or predominate such as sore throat, ear pain, productive cough, shortness of breath, or if you develop high fevers or worsening fatigue over the next several days.       ED Prescriptions     Medication Sig Dispense Auth. Provider   promethazine-dextromethorphan (PROMETHAZINE-DM) 6.25-15 MG/5ML syrup Take 5 mLs by mouth 4 (four) times daily as needed. 118 mL Shirlee Latch, PA-C      PDMP not reviewed this encounter.   Shirlee Latch, PA-C 01/07/24 1235

## 2024-01-07 NOTE — ED Triage Notes (Signed)
Patient states that he started runny nose, nasal congestion, and cough that started Thursday.  Patient denies fevers.   Patient recently traveled from [place]

## 2024-01-07 NOTE — Discharge Instructions (Signed)
-  Negative COVID, flu, RSV and strep testing.  Likely another virus.  URI/COLD SYMPTOMS: Your exam today is consistent with a viral illness. Antibiotics are not indicated at this time. Use medications as directed, including cough syrup, nasal saline, and decongestants. Your symptoms should improve over the next few days and resolve within 7-10 days. Increase rest and fluids. F/u if symptoms worsen or predominate such as sore throat, ear pain, productive cough, shortness of breath, or if you develop high fevers or worsening fatigue over the next several days.

## 2024-05-24 ENCOUNTER — Institutional Professional Consult (permissible substitution) (INDEPENDENT_AMBULATORY_CARE_PROVIDER_SITE_OTHER): Admitting: Otolaryngology

## 2024-12-16 ENCOUNTER — Other Ambulatory Visit: Payer: Self-pay

## 2024-12-16 MED ORDER — PNEUMOCOCCAL 21-VAL CONJ VACC 0.5 ML IM SOSY
PREFILLED_SYRINGE | INTRAMUSCULAR | 0 refills | Status: AC
  Filled 2024-12-16: qty 0.5, 1d supply, fill #0
